# Patient Record
Sex: Male | Born: 1970
Health system: Southern US, Community
[De-identification: ages and names within clinical notes are randomized; demographics above are authoritative.]

## PROBLEM LIST (undated history)

## (undated) DIAGNOSIS — I10 Essential (primary) hypertension: Secondary | ICD-10-CM

## (undated) DIAGNOSIS — M1990 Unspecified osteoarthritis, unspecified site: Secondary | ICD-10-CM

## (undated) HISTORY — PX: OTHER SURGICAL HISTORY: SHX169

## (undated) HISTORY — PX: LAPAROSCOPIC GASTRIC BANDING: SHX1100

## (undated) HISTORY — PX: BACK SURGERY: SHX140

---

## 2002-03-18 ENCOUNTER — Emergency Department (HOSPITAL_COMMUNITY): Admission: EM | Admit: 2002-03-18 | Discharge: 2002-03-18 | Payer: Self-pay | Admitting: Emergency Medicine

## 2002-12-09 ENCOUNTER — Other Ambulatory Visit: Admission: RE | Admit: 2002-12-09 | Discharge: 2002-12-09 | Payer: Self-pay | Admitting: Urology

## 2004-02-02 ENCOUNTER — Ambulatory Visit (HOSPITAL_COMMUNITY): Admission: RE | Admit: 2004-02-02 | Discharge: 2004-02-02 | Payer: Self-pay | Admitting: Neurosurgery

## 2007-11-04 ENCOUNTER — Encounter: Admission: RE | Admit: 2007-11-04 | Discharge: 2007-12-29 | Payer: Self-pay | Admitting: Surgery

## 2007-11-11 ENCOUNTER — Ambulatory Visit (HOSPITAL_COMMUNITY): Admission: RE | Admit: 2007-11-11 | Discharge: 2007-11-11 | Payer: Self-pay | Admitting: Surgery

## 2007-11-11 ENCOUNTER — Ambulatory Visit: Admission: RE | Admit: 2007-11-11 | Discharge: 2007-11-11 | Payer: Self-pay | Admitting: Surgery

## 2007-11-18 ENCOUNTER — Ambulatory Visit (HOSPITAL_COMMUNITY): Admission: RE | Admit: 2007-11-18 | Discharge: 2007-11-18 | Payer: Self-pay | Admitting: Surgery

## 2008-01-26 ENCOUNTER — Encounter: Admission: RE | Admit: 2008-01-26 | Discharge: 2008-02-16 | Payer: Self-pay | Admitting: Surgery

## 2008-02-09 ENCOUNTER — Ambulatory Visit (HOSPITAL_COMMUNITY): Admission: RE | Admit: 2008-02-09 | Discharge: 2008-02-10 | Payer: Self-pay | Admitting: Surgery

## 2008-02-23 ENCOUNTER — Encounter: Admission: RE | Admit: 2008-02-23 | Discharge: 2008-02-23 | Payer: Self-pay | Admitting: Surgery

## 2009-10-16 ENCOUNTER — Ambulatory Visit: Payer: Self-pay | Admitting: Orthopedic Surgery

## 2009-10-16 DIAGNOSIS — L02519 Cutaneous abscess of unspecified hand: Secondary | ICD-10-CM

## 2009-10-16 DIAGNOSIS — L03119 Cellulitis of unspecified part of limb: Secondary | ICD-10-CM

## 2009-10-19 ENCOUNTER — Ambulatory Visit (HOSPITAL_COMMUNITY): Admission: RE | Admit: 2009-10-19 | Discharge: 2009-10-19 | Payer: Self-pay | Admitting: Family Medicine

## 2009-10-19 ENCOUNTER — Inpatient Hospital Stay (HOSPITAL_COMMUNITY): Admission: EM | Admit: 2009-10-19 | Discharge: 2009-10-20 | Payer: Self-pay | Admitting: Emergency Medicine

## 2010-05-08 NOTE — Letter (Signed)
Summary: Hisotry form  Hisotry form   Imported By: Jacklynn Ganong 10/23/2009 07:58:11  _____________________________________________________________________  External Attachment:    Type:   Image     Comment:   External Document

## 2010-05-08 NOTE — Assessment & Plan Note (Signed)
Summary: ?INFECTION HAND/LACERATION INJURY/REF S.BRUCE,PHONE/CAF   Visit Type:  Initial   Referring Provider:  Earma Reading Primary Provider:  [facility] Lis [facility]  CC:  pain swelling LEFT hand.  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  He said it healed up nicely and he did well until yesterday when he started having pain swelling increased redness with throbbing.  He reports 8/10 pain  He was seen by his primary care PA today who gave him a dose of Rocephin 1 g IV in asked that he be seen today so he is seen as an urgent referral  He reports negative review of systems for constitutional, HEENT, cardiovascular, respiratory, GI, GU, musculoskeletal, skin, neurologic, psychiatric, hematologic endocrine and ALLERGIC but he obviously has redness around the area previous laceration noted on the index finger and redness and swelling in his hand  He reports no ALLERGIES  Reports no medical problems  Report surgical treatment of his back and he also had a laparoscopic band  He takes lisinopril and hydrochlorothiazide 20 mg and 12.5 mg respectively  Family history was reported as negative  He is 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  Physical Exam  Additional Exam:  weight 212 pounds pulse 82 respiratory rate 18  He was well-developed and nourished woman hygiene were normal  Abnormal perfusion to his LEFT upper extremity with a strong radial pulse  He had no lymphangitis or lymphadenopathy  Data laceration on the radial border of his index finger proximally near the proximal phalanx the laceration appeared to have healed  Abnormal sensation in the finger  He was awake he was alert he was oriented x3  His gait pattern  was normal  Inspection revealed that his hand and index finger were swollen and red and tender to touch there is no joint involvement although he had decreased range of motion of the index finger.  Muscle tone was normal.  Stability of the digit was normal.   Impression & Recommendations:  Problem # 1:  CELLULITIS, LEFT HAND (ICD-682.4) Assessment New  His Ativan dosage of Rocephin this may do the trick but probably not because he seems to have a cellulitis now.  He did say that he had a pustule come up he popped it and this is probably the source of staph infection so he will be treated with Bactrim.  Right now he does not need any surgical treatment just to be followed on p.o. antibiotics.  If he does not improve then I would recommend outpatient IV therapy with vancomycin which can be followed by his primary care physicians.  If this fails to give him any better then of course we would see him again in consultation or as referral.  Orders: New Patient Level III ([address])  Patient Instructions: 1)  Soak the entire hand in warm water and epsom salts three times a day for 20 min  2)  take the oral pills two times a day  3)  f/u with Earma Reading Thursday  4)  if not better refer back to [place]  5)  OOW this week

## 2010-05-08 NOTE — Letter (Signed)
Summary: Out of Work  [facility]  [address]  [place], [place] [address]   Phone: [phone]  Fax: [phone]    October 16, 2009   Employee:  SAMAD THON    To Whom It May Concern:   For Medical reasons, please excuse the above named employee from work for the following dates:  Start:   10/16/2009  End:   10-20-2009   May return 10-23-2009  If you need additional information, please feel free to contact our office.         Sincerely,    Dr. Lorenz Coaster.

## 2010-06-24 LAB — BASIC METABOLIC PANEL
BUN: 9 mg/dL (ref 6–23)
CO2: 22 mEq/L (ref 19–32)
Calcium: 9.4 mg/dL (ref 8.4–10.5)
Chloride: 101 mEq/L (ref 96–112)
Creatinine, Ser: 0.75 mg/dL (ref 0.4–1.5)
GFR calc Af Amer: >60 mL/min (ref >60)
GFR calc non Af Amer: >60 mL/min (ref >60)
Glucose, Bld: 91 mg/dL (ref 70–99)
Potassium: 3.8 mEq/L (ref 3.5–5.1)
Sodium: 137 mEq/L (ref 135–145)

## 2010-06-24 LAB — ANAEROBIC CULTURE

## 2010-06-24 LAB — CULTURE, ROUTINE-ABSCESS

## 2010-06-24 LAB — CBC
HCT: 44.8 % (ref 39.0–52.0)
Hemoglobin: 15.6 g/dL (ref 13.0–17.0)
MCH: 33.9 pg (ref 26.0–34.0)
MCHC: 34.7 g/dL (ref 30.0–36.0)
MCV: 97.5 fL (ref 78.0–100.0)
Platelets: 287 10*3/uL (ref 150–400)
RBC: 4.6 MIL/uL (ref 4.22–5.81)
RDW: 12.8 % (ref 11.5–15.5)
WBC: 10.1 10*3/uL (ref 4.0–10.5)

## 2010-06-24 LAB — DIFFERENTIAL
Basophils Absolute: 0 10*3/uL (ref 0.0–0.1)
Basophils Relative: 0 % (ref 0–1)
Eosinophils Absolute: 0.1 10*3/uL (ref 0.0–0.7)
Eosinophils Relative: 1 % (ref 0–5)
Lymphocytes Relative: 17 % (ref 12–46)
Lymphs Abs: 1.7 10*3/uL (ref 0.7–4.0)
Monocytes Absolute: 1.1 10*3/uL — ABNORMAL HIGH (ref 0.1–1.0)
Monocytes Relative: 11 % (ref 3–12)
Neutro Abs: 7.1 10*3/uL (ref 1.7–7.7)
Neutrophils Relative %: 70 % (ref 43–77)

## 2010-08-21 NOTE — Op Note (Signed)
Riley Larson, Riley Larson                ACCOUNT NO.:  [id number]   MEDICAL RECORD NO.:  [id number]          PATIENT TYPE:  OIB   LOCATION:  1537                         FACILITY:  [facility]   PHYSICIAN:  Thornton Park. Daphine Deutscher, MD  DATE OF BIRTH:  [birth year]   DATE OF PROCEDURE:  02/09/2008  DATE OF DISCHARGE:                               OPERATIVE REPORT   CHIEF COMPLAINT:  Morbid obesity, Body Mass Index of 41.   PROCEDURE:  Lap-Band laparoscopic adjustable gastric banding (Allergan  APL).   SURGEON:  Daphine Deutscher   ASSISTANT:  Earle   ANESTHESIA:  General endotracheal.   DESCRIPTION OF PROCEDURE:  Riley Larson is 40 year old white male who  was brought to [facility] on Tuesday, February 09, 2008, and given general  anesthesia.  The abdomen was prepped with chlorhexidine and alcohol prep  (ChloraPrep), and draped sterilely.  Access to the abdomen was achieved  through the left upper quadrant using a 0-degree Optiview without  difficulty insufflating the abdomen.  The transverse colon was actually  kind of stuck up to the falciform ligament and I ended up placing a  camera viewport to the left of the umbilicus, and then using the left  upper quadrant port and sharp dissection to take these adhesions down.  Once I was down, standard ports were placed on the right side including  the 15 in the right upper position and 11 below.  A 5 was placed in the  upper midline for the [facility] retractor.  He had a large left lateral  segment and this was retracted.  Dr. Colin Benton and inserted another 5  laterally on the left.   We dissected a spot to the left of the crus and dissected that with a  finger for the band passer to come through.  I then with the pars  flaccida technique opened the gastrohepatic window and found the fat  stripe down at the base of the right crus and passed a band passer along  that, and then I introduced an APL band, brought it around, and snapped  it in place, although this one tended to  snap with a little more  difficulty.  Prior to that, I had passed the balloon-tip catheter and  had calibrated, and he had no evidence of a hiatal hernia.  This was  snapped down in place over the tubing and there seemed to be plenty of  room.   I then plicated the anterior stomach over up to the pouch with 4 sutures  of Surgidac, securing them with tie knots, and then an anti-slip stitch  was placed on the medial border.  The tubing was brought out through the  right lower port and then a location was created on the fascia there  where once connected to the port, it was secured with 4  sutures of 2-0 Prolene to the fascia.  Wounds were all irrigated with  saline.  They were closed with 4-0 Vicryl, Benzoin, and Steri-Strips.  The patient seemed to tolerate the procedure well and was taken to the  recovery room in satisfactory addition.  Port  sites were injected with  0.5% Marcaine plain.      Thornton Park Daphine Deutscher, MD  Electronically Signed     MBM/MEDQ  D:  02/09/2008  T:  02/09/2008  Job:  [id number]

## 2010-08-21 NOTE — Procedures (Signed)
NAME:  Riley Larson, Riley Larson                ACCOUNT NO.:  [id number]   MEDICAL RECORD NO.:  [id number]          PATIENT TYPE:  OUT   LOCATION:  SLEE                          FACILITY:  [facility]   PHYSICIAN:  Kofi A. Gerilyn Pilgrim, M.D. DATE OF BIRTH:  16-Aug-1970   DATE OF PROCEDURE:  DATE OF DISCHARGE:  11/11/2007                             SLEEP DISORDER REPORT   INDICATIONS:  A 40 year old man who presents with snoring, witnessed  apneas.  He is being evaluated for possible obstructive sleep apnea  syndrome.   MEDICATIONS:  Lisinopril, hydrochlorothiazide, diltiazem.   Epworth sleepiness scale 8.   BMI 50.   SLEEP STAGE SUMMARY:  The total recording time is 357 minutes.  Sleep  efficiency 90%.  Sleep latency 8 minutes.  REM latency 104 minutes.  Stage N1 9%, N2 65%, N3 11.5%.  REM sleep 14%.   RESPIRATORY SUMMARY:  Baseline oxygen saturation 95%.  Lowest saturation  83%.  AHI 7.   LIMB MOVEMENT SUMMARY:  PLM index 0.   ELECTROCARDIOGRAM SUMMARY:  Average heart rate 72 with no arrhythmias  observed.   IMPRESSION:  Mild obstructive sleep apnea syndrome not requiring  positive pressure.      Kofi A. Gerilyn Pilgrim, M.D.  Electronically Signed     KAD/MEDQ  D:  11/16/2007  T:  11/17/2007  Job:  [id number]

## 2011-01-08 LAB — BASIC METABOLIC PANEL
BUN: 10
CO2: 29
Calcium: 9.4
Chloride: 106
Creatinine, Ser: 0.93
GFR calc Af Amer: >60
GFR calc non Af Amer: >60
Glucose, Bld: 109 — ABNORMAL HIGH
Potassium: 4.7
Sodium: 141

## 2011-01-08 LAB — DIFFERENTIAL
Basophils Absolute: 0.1
Basophils Relative: 1
Eosinophils Absolute: 0
Eosinophils Relative: 0
Lymphocytes Relative: 8 — ABNORMAL LOW
Lymphs Abs: 0.9
Monocytes Absolute: 0.6
Monocytes Relative: 6
Neutro Abs: 8.9 — ABNORMAL HIGH
Neutrophils Relative %: 85 — ABNORMAL HIGH

## 2011-01-08 LAB — HEMOGLOBIN AND HEMATOCRIT, BLOOD
HCT: 45.2
HCT: 43.6
Hemoglobin: 15.7
Hemoglobin: 14.8

## 2011-01-08 LAB — CBC
HCT: 40.6
Hemoglobin: 13.9
MCHC: 34.3
MCV: 94.7
Platelets: 278
RBC: 4.29
RDW: 12.4
WBC: 10.5

## 2011-03-13 ENCOUNTER — Other Ambulatory Visit (HOSPITAL_COMMUNITY): Payer: Self-pay | Admitting: Physician Assistant

## 2011-03-13 DIAGNOSIS — M519 Unspecified thoracic, thoracolumbar and lumbosacral intervertebral disc disorder: Secondary | ICD-10-CM

## 2011-03-15 ENCOUNTER — Ambulatory Visit (HOSPITAL_COMMUNITY)
Admission: RE | Admit: 2011-03-15 | Discharge: 2011-03-15 | Disposition: A | Discharge status: Home / Self Care | Payer: BC Managed Care – PPO | Source: Ambulatory Visit | Attending: Physician Assistant | Admitting: Physician Assistant

## 2011-03-15 DIAGNOSIS — M5126 Other intervertebral disc displacement, lumbar region: Secondary | ICD-10-CM | POA: Insufficient documentation

## 2011-03-15 DIAGNOSIS — M545 Low back pain, unspecified: Secondary | ICD-10-CM | POA: Insufficient documentation

## 2011-03-15 DIAGNOSIS — M519 Unspecified thoracic, thoracolumbar and lumbosacral intervertebral disc disorder: Secondary | ICD-10-CM

## 2011-03-15 DIAGNOSIS — M25559 Pain in unspecified hip: Secondary | ICD-10-CM | POA: Insufficient documentation

## 2011-08-01 ENCOUNTER — Telehealth (INDEPENDENT_AMBULATORY_CARE_PROVIDER_SITE_OTHER): Payer: Self-pay | Admitting: Surgery

## 2011-08-01 NOTE — Telephone Encounter (Signed)
08/01/11 mailed recall letter to patient for bariatric surgery follow-up. Advised patient to call [facility] @ [phone] to schedule an appointment. cef 

## 2013-07-22 ENCOUNTER — Telehealth (HOSPITAL_COMMUNITY): Payer: Self-pay

## 2013-07-22 NOTE — Telephone Encounter (Signed)
This patient is overdue for recommended follow-up with a bariatric surgeon at [facility]. Call attempted today to reestablish post-op care with [facility], but unable to reach patient by phone.  A letter will be mailed to the patient today to the address on file from [facility] advising the patient on the benefits of follow-up care and directing them to call [facility] at [phone] to schedule an appointment at their earliest convenience.  ° °Amanda T. Fleming °Bariatric Office Coordinator °[phone] ° °

## 2015-01-24 ENCOUNTER — Telehealth (HOSPITAL_COMMUNITY): Payer: Self-pay

## 2015-01-24 NOTE — Telephone Encounter (Signed)
This patient is overdue for recommended follow-up with a bariatric surgeon at [facility]. A letter was mailed to the patient 01-12-15 to the address on file from both [place] advising the patient on the benefits of follow-up care and directing them to call [facility] at [phone] to schedule an appointment at their earliest convenience. Letter was returned to [facility] today marked undeliverable, unable to forward. Call attempted today to reestablish post-op care with [facility] & update mailing address, but unable to reach patient by phone.  Both home & Mobile # are invalid & unsure if the work # is associated to this patient. No e-mail address on file.    Alphonsa Overall. [facility] Bariatric Office Coordinator [phone]

## 2016-05-08 DIAGNOSIS — M1000 Idiopathic gout, unspecified site: Secondary | ICD-10-CM | POA: Diagnosis not present

## 2016-05-08 DIAGNOSIS — M25561 Pain in right knee: Secondary | ICD-10-CM | POA: Diagnosis not present

## 2016-05-08 DIAGNOSIS — Z1389 Encounter for screening for other disorder: Secondary | ICD-10-CM | POA: Diagnosis not present

## 2016-05-08 DIAGNOSIS — Z6833 Body mass index (BMI) 33.0-33.9, adult: Secondary | ICD-10-CM | POA: Diagnosis not present

## 2016-05-08 DIAGNOSIS — E6609 Other obesity due to excess calories: Secondary | ICD-10-CM | POA: Diagnosis not present

## 2016-11-14 DIAGNOSIS — F419 Anxiety disorder, unspecified: Secondary | ICD-10-CM | POA: Diagnosis not present

## 2016-11-14 DIAGNOSIS — Z79899 Other long term (current) drug therapy: Secondary | ICD-10-CM | POA: Diagnosis not present

## 2016-11-14 DIAGNOSIS — Z6832 Body mass index (BMI) 32.0-32.9, adult: Secondary | ICD-10-CM | POA: Diagnosis not present

## 2016-11-14 DIAGNOSIS — E6609 Other obesity due to excess calories: Secondary | ICD-10-CM | POA: Diagnosis not present

## 2016-11-14 DIAGNOSIS — Z1389 Encounter for screening for other disorder: Secondary | ICD-10-CM | POA: Diagnosis not present

## 2016-11-14 DIAGNOSIS — M1000 Idiopathic gout, unspecified site: Secondary | ICD-10-CM | POA: Diagnosis not present

## 2016-11-29 DIAGNOSIS — E6609 Other obesity due to excess calories: Secondary | ICD-10-CM | POA: Diagnosis not present

## 2016-11-29 DIAGNOSIS — Z6832 Body mass index (BMI) 32.0-32.9, adult: Secondary | ICD-10-CM | POA: Diagnosis not present

## 2016-11-29 DIAGNOSIS — M1000 Idiopathic gout, unspecified site: Secondary | ICD-10-CM | POA: Diagnosis not present

## 2016-12-24 DIAGNOSIS — B078 Other viral warts: Secondary | ICD-10-CM | POA: Diagnosis not present

## 2016-12-24 DIAGNOSIS — L570 Actinic keratosis: Secondary | ICD-10-CM | POA: Diagnosis not present

## 2016-12-24 DIAGNOSIS — X32XXXA Exposure to sunlight, initial encounter: Secondary | ICD-10-CM | POA: Diagnosis not present

## 2016-12-24 DIAGNOSIS — D225 Melanocytic nevi of trunk: Secondary | ICD-10-CM | POA: Diagnosis not present

## 2016-12-24 DIAGNOSIS — A630 Anogenital (venereal) warts: Secondary | ICD-10-CM | POA: Diagnosis not present

## 2017-01-21 DIAGNOSIS — M25561 Pain in right knee: Secondary | ICD-10-CM | POA: Diagnosis not present

## 2017-01-21 DIAGNOSIS — Z6832 Body mass index (BMI) 32.0-32.9, adult: Secondary | ICD-10-CM | POA: Diagnosis not present

## 2017-01-21 DIAGNOSIS — Z1389 Encounter for screening for other disorder: Secondary | ICD-10-CM | POA: Diagnosis not present

## 2017-01-21 DIAGNOSIS — M1000 Idiopathic gout, unspecified site: Secondary | ICD-10-CM | POA: Diagnosis not present

## 2017-01-21 DIAGNOSIS — E6609 Other obesity due to excess calories: Secondary | ICD-10-CM | POA: Diagnosis not present

## 2017-02-06 DIAGNOSIS — M25469 Effusion, unspecified knee: Secondary | ICD-10-CM | POA: Diagnosis not present

## 2017-02-06 DIAGNOSIS — Z6832 Body mass index (BMI) 32.0-32.9, adult: Secondary | ICD-10-CM | POA: Diagnosis not present

## 2017-02-06 DIAGNOSIS — Z1389 Encounter for screening for other disorder: Secondary | ICD-10-CM | POA: Diagnosis not present

## 2017-02-06 DIAGNOSIS — M1009 Idiopathic gout, multiple sites: Secondary | ICD-10-CM | POA: Diagnosis not present

## 2017-02-06 DIAGNOSIS — E6609 Other obesity due to excess calories: Secondary | ICD-10-CM | POA: Diagnosis not present

## 2017-09-08 ENCOUNTER — Other Ambulatory Visit (HOSPITAL_COMMUNITY): Payer: Self-pay | Admitting: Family Medicine

## 2017-09-08 ENCOUNTER — Ambulatory Visit (HOSPITAL_COMMUNITY)
Admission: RE | Admit: 2017-09-08 | Discharge: 2017-09-08 | Disposition: A | Discharge status: Home / Self Care | Payer: BLUE CROSS/BLUE SHIELD | Source: Ambulatory Visit | Attending: Family Medicine | Admitting: Family Medicine

## 2017-09-08 DIAGNOSIS — R079 Chest pain, unspecified: Secondary | ICD-10-CM

## 2017-09-08 DIAGNOSIS — I2510 Atherosclerotic heart disease of native coronary artery without angina pectoris: Secondary | ICD-10-CM | POA: Diagnosis not present

## 2017-09-08 DIAGNOSIS — Z9884 Bariatric surgery status: Secondary | ICD-10-CM | POA: Insufficient documentation

## 2017-09-08 DIAGNOSIS — R0602 Shortness of breath: Secondary | ICD-10-CM | POA: Diagnosis not present

## 2017-09-08 DIAGNOSIS — E6609 Other obesity due to excess calories: Secondary | ICD-10-CM | POA: Diagnosis not present

## 2017-09-08 DIAGNOSIS — K760 Fatty (change of) liver, not elsewhere classified: Secondary | ICD-10-CM | POA: Insufficient documentation

## 2017-09-08 DIAGNOSIS — I700 Atherosclerosis of aorta: Secondary | ICD-10-CM | POA: Insufficient documentation

## 2017-09-08 DIAGNOSIS — Z1389 Encounter for screening for other disorder: Secondary | ICD-10-CM | POA: Diagnosis not present

## 2017-09-08 DIAGNOSIS — R0789 Other chest pain: Secondary | ICD-10-CM | POA: Diagnosis not present

## 2017-09-08 DIAGNOSIS — Z6833 Body mass index (BMI) 33.0-33.9, adult: Secondary | ICD-10-CM | POA: Diagnosis not present

## 2017-09-08 LAB — POCT I-STAT CREATININE: Creatinine, Ser: 0.7 mg/dL (ref 0.61–1.24)

## 2017-09-08 MED ORDER — IOPAMIDOL (ISOVUE-370) INJECTION 76%
100.0000 mL | Freq: Once | INTRAVENOUS | Status: AC | PRN
  Administered 2017-09-08: 100 mL via INTRAVENOUS

## 2018-09-28 DIAGNOSIS — Z9884 Bariatric surgery status: Secondary | ICD-10-CM | POA: Diagnosis not present

## 2018-09-28 DIAGNOSIS — Z6832 Body mass index (BMI) 32.0-32.9, adult: Secondary | ICD-10-CM | POA: Diagnosis not present

## 2018-09-28 DIAGNOSIS — Z1389 Encounter for screening for other disorder: Secondary | ICD-10-CM | POA: Diagnosis not present

## 2018-09-28 DIAGNOSIS — I10 Essential (primary) hypertension: Secondary | ICD-10-CM | POA: Diagnosis not present

## 2018-09-28 DIAGNOSIS — E7849 Other hyperlipidemia: Secondary | ICD-10-CM | POA: Diagnosis not present

## 2018-09-28 DIAGNOSIS — M109 Gout, unspecified: Secondary | ICD-10-CM | POA: Diagnosis not present

## 2018-12-22 ENCOUNTER — Other Ambulatory Visit: Payer: Self-pay | Admitting: Neurological Surgery

## 2018-12-22 ENCOUNTER — Other Ambulatory Visit (HOSPITAL_COMMUNITY): Payer: Self-pay | Admitting: Neurological Surgery

## 2018-12-22 DIAGNOSIS — M5416 Radiculopathy, lumbar region: Secondary | ICD-10-CM

## 2018-12-30 ENCOUNTER — Encounter (HOSPITAL_COMMUNITY): Payer: Self-pay

## 2018-12-30 ENCOUNTER — Other Ambulatory Visit: Payer: Self-pay

## 2018-12-30 ENCOUNTER — Ambulatory Visit (HOSPITAL_COMMUNITY)
Admission: RE | Admit: 2018-12-30 | Discharge: 2018-12-30 | Disposition: A | Discharge status: Home / Self Care | Payer: BLUE CROSS/BLUE SHIELD | Source: Ambulatory Visit | Attending: Neurological Surgery | Admitting: Neurological Surgery

## 2018-12-30 ENCOUNTER — Other Ambulatory Visit (HOSPITAL_COMMUNITY): Payer: Self-pay | Admitting: Neurological Surgery

## 2018-12-30 ENCOUNTER — Other Ambulatory Visit: Payer: Self-pay | Admitting: Neurological Surgery

## 2018-12-30 DIAGNOSIS — M5416 Radiculopathy, lumbar region: Secondary | ICD-10-CM | POA: Diagnosis not present

## 2018-12-30 LAB — POCT I-STAT CREATININE: Creatinine, Ser: 0.7 mg/dL (ref 0.61–1.24)

## 2019-01-11 ENCOUNTER — Ambulatory Visit
Admission: RE | Admit: 2019-01-11 | Discharge: 2019-01-11 | Disposition: A | Discharge status: Home / Self Care | Payer: BC Managed Care – PPO | Source: Ambulatory Visit | Attending: Neurological Surgery | Admitting: Neurological Surgery

## 2019-01-11 ENCOUNTER — Other Ambulatory Visit: Payer: Self-pay

## 2019-01-11 DIAGNOSIS — M5416 Radiculopathy, lumbar region: Secondary | ICD-10-CM | POA: Diagnosis present

## 2019-01-11 MED ORDER — GADOBUTROL 1 MMOL/ML IV SOLN
10.0000 mL | Freq: Once | INTRAVENOUS | Status: AC | PRN
  Administered 2019-01-11: 10 mL via INTRAVENOUS

## 2019-06-17 ENCOUNTER — Other Ambulatory Visit: Payer: Self-pay

## 2019-06-17 ENCOUNTER — Encounter (HOSPITAL_COMMUNITY): Payer: Self-pay | Admitting: Physical Therapy

## 2019-06-17 ENCOUNTER — Ambulatory Visit (HOSPITAL_COMMUNITY): Payer: BC Managed Care – PPO | Attending: Neurological Surgery | Admitting: Physical Therapy

## 2019-06-17 DIAGNOSIS — G8929 Other chronic pain: Secondary | ICD-10-CM | POA: Insufficient documentation

## 2019-06-17 DIAGNOSIS — M6281 Muscle weakness (generalized): Secondary | ICD-10-CM | POA: Insufficient documentation

## 2019-06-17 DIAGNOSIS — M5441 Lumbago with sciatica, right side: Secondary | ICD-10-CM | POA: Insufficient documentation

## 2019-06-17 DIAGNOSIS — R29898 Other symptoms and signs involving the musculoskeletal system: Secondary | ICD-10-CM | POA: Insufficient documentation

## 2019-06-17 NOTE — Therapy (Signed)
[place], [place], [address] Phone: [phone]   Fax:  [phone]  Physical Therapy Evaluation  Patient Details  Name: Riley Larson MRN: [id number] Date of Birth: June 30, 1970 Referring Provider (PT): Emelda Brothers, MD   Encounter Date: 06/17/2019  PT End of Session - 06/17/19 1758    Visit Number  1    Number of Visits  12    Date for PT Re-Evaluation  07/29/19    Authorization Type  Primary: BCBS; Secondary: [employer]    Authorization Time Period  06/17/19 - 07/29/19    Progress Note Due on Visit  10    PT Start Time  [id number]    PT Stop Time  1345    PT Time Calculation (min)  40 min    Activity Tolerance  Patient tolerated treatment well    Behavior During Therapy  [facility] for tasks assessed/performed       History reviewed. No pertinent past medical history.  History reviewed. No pertinent surgical history.  There were no vitals filed for this visit.   Subjective Assessment - 06/17/19 1313    Subjective  Patient reported that he has had back pain which has fluctuated in intensity for many years. The patient reported that some days he feels 100%, but other days he has a really hard time due to pain. Patient reported that he has had back pain since about August of 2020. He stated that he had back surgery on  May 03, 2019 and that this helped decrease his pain some. He reported that he also had a back surgery in 2005-2006. He stated that this original surgery was to fix L5-S1 which had ruptured. He reported that the 2nd surgery was to repair between L5-S1. Patient reported that he has pain that goes laterally down his right leg to his foot. He reported that at times the pain can be sharper, but can also be dull. Patient reported that midday is when the pain increases the most. He reported that the numbness in his leg has stopped following the surgery, but that it is pain that he continues to feel. He  denied any changes in his bowel or bladder function. Patient reported that at times his pain gets up to a 9 out of 10.    Limitations  Sitting;Standing;Walking;House hold activities    Diagnostic tests  MRI    Patient Stated Goals  To have less pain    Currently in Pain?  No/denies         [facility] PT Assessment - 06/17/19 0001      Assessment   Medical Diagnosis  Lumbar radiculopathy    Referring Provider (PT)  Emelda Brothers, MD    Onset Date/Surgical Date  05/03/19    Next MD Visit  --   In a few weeks   Prior Therapy  None      Precautions   Precautions  None      Restrictions   Weight Bearing Restrictions  No      Balance Screen   Has the patient fallen in the past 6 months  No    Has the patient had a decrease in activity level because of a fear of falling?   Yes    Is the patient reluctant to leave their home because of a fear of falling?   No      Home Social worker  Private residence    Living  Arrangements  Spouse/significant other    Type of Home  House      Prior Function   Level of Independence  Independent;Independent with basic ADLs    Vocation  --   Out of work currently   Leisure  riding motorcycles      Cognition   Overall Cognitive Status  Within Functional Limits for tasks assessed      Observation/Other Assessments   Focus on Therapeutic Outcomes (FOTO)   44%      Sensation   Light Touch  Appears Intact      ROM / Strength   AROM / PROM / Strength  AROM;Strength      AROM   AROM Assessment Site  Lumbar    Lumbar Flexion  50% limited painful on the right side     Lumbar Extension  75% limited painful on the right side    Lumbar - Right Side Bend  WFL     Lumbar - Left Side Bend  WFL    Lumbar - Right Rotation  WFL    Lumbar - Left Rotation  [facility]      Strength   Strength Assessment Site  Hip;Knee;Ankle    Right/Left Hip  Right;Left    Right Hip Flexion  4+/5    Right Hip Extension  4+/5   painful   Right Hip  ABduction  4+/5    Left Hip Flexion  5/5    Left Hip Extension  4+/5   Painful   Left Hip ABduction  4+/5    Right/Left Knee  Right;Left    Right Knee Flexion  5/5    Right Knee Extension  5/5    Left Knee Flexion  5/5    Left Knee Extension  5/5    Right/Left Ankle  Right;Left    Right Ankle Dorsiflexion  5/5    Left Ankle Dorsiflexion  5/5      Palpation   Spinal mobility  Hypomobility throughout thoracic and lumbar spine with CPAs    Palpation comment  Tenderness to palpation around incision site      Special Tests    Special Tests  Lumbar    Lumbar Tests  Slump Test      Slump test   Findings  Negative    Side  --   Bilateral     Ambulation/Gait   Ambulation/Gait  Yes    Ambulation Distance (Feet)  452 Feet   2MWT   Assistive device  None    Ambulation Surface  Level;Indoor    Gait Comments  Increased valgus bilaterally, increased ER bilaterally                Objective measurements completed on examination: See above findings.              PT Education - 06/17/19 1758    Education Details  Discussed examination findings, POC, and initial HEP.    Person(s) Educated  Patient    Methods  Explanation;Handout    Comprehension  Verbalized understanding       PT Short Term Goals - 06/17/19 1812      PT SHORT TERM GOAL #1   Title  Patient will report understanding and regular compliance with HEP to improve mobility and decrease pain.    Time  3    Period  Weeks    Status  New    Target Date  07/08/19      PT SHORT TERM GOAL #2  Title  Patient will report that his pain level has not exceeded a 7/10 over the course of a 1 week period indicating improved tolerance to daily activities.    Time  3    Period  Weeks    Status  New    Target Date  07/08/19        PT Long Term Goals - 06/17/19 1814      PT LONG TERM GOAL #1   Title  Patient will report an improvement of at least 50% in overall symptoms  to improve QOL.    Time  6     Period  Weeks    Status  New    Target Date  07/29/19      PT LONG TERM GOAL #2   Title  Patient will report that his RT leg pain has not extended past his knee for at least a week indicating improved centralization of symptoms.    Time  6    Period  Weeks    Status  New    Target Date  07/29/19      PT LONG TERM GOAL #3   Title  Patient will report that his pain level has not exceeded a 3/10 over the course of a 1 week period indicating improved tolerance to daily activities.    Time  6    Period  Weeks    Status  New    Target Date  07/29/19             Plan - 06/17/19 1829    Clinical Impression Statement  Patient is a 49 year old male who presents to outpatient physical therapy with primary complaint of low back pain which has been ongoing for several months in addition, he had a back surgery on 05/03/19. He reported that he has not been able to work due to current limitations from his back. Upon examination, noted decreased lumbar AROM, some decreased strength, and pain with some functional movement. Palpation showed hypomobility through the spine as well as some tenderness around the incision. Patient also demonstrated some gait deviations with ambulation. Patient would benefit from skilled physical therapy to address the abovementioned deficits and help patient return to his PLOF.    Personal Factors and Comorbidities  Time since onset of injury/illness/exacerbation;Comorbidity 1    Comorbidities  HTN    Examination-Activity Limitations  Stand;Lift;Locomotion Level;Bend;Reach Overhead;Squat;Stairs    Examination-Participation Restrictions  Yard Work;Meal Prep;Cleaning;Community Activity    Stability/Clinical Decision Making  Stable/Uncomplicated    Clinical Decision Making  Low    Rehab Potential  Good    PT Frequency  2x / week    PT Duration  6 weeks    PT Treatment/Interventions  ADLs/Self Care Home Management;Aquatic Therapy;Cryotherapy;Electrical Stimulation;Moist  Heat;Traction;DME Instruction;Gait training;Stair training;Functional mobility training;Therapeutic activities;Therapeutic exercise;Balance training;Neuromuscular re-education;Patient/family education;Orthotic Fit/Training;Manual techniques;Passive range of motion;Scar mobilization;Dry needling;Energy conservation;Taping    PT Next Visit Plan  Review eval goals and HEP, perform gentle joint mobilizations spine for pain relief, ROM    PT Home Exercise Plan  06/17/19: Ab set with legs at 90/90 20x 5'' holds    Consulted and Agree with Plan of Care  Patient       Patient will benefit from skilled therapeutic intervention in order to improve the following deficits and impairments:  Abnormal gait, Increased fascial restricitons, Pain, Improper body mechanics, Decreased mobility, Postural dysfunction, Decreased activity tolerance, Decreased endurance, Decreased range of motion, Decreased strength, Hypomobility  Visit Diagnosis: Chronic bilateral low  back pain with right-sided sciatica  Muscle weakness (generalized)  Other symptoms and signs involving the musculoskeletal system     Problem List Patient Active Problem List   Diagnosis Date Noted  . CELLULITIS, LEFT HAND 10/16/2009   [name] PT, DPT 6:33 PM, 06/17/19 [place], [place], [address] Phone: [phone]   Fax:  [phone]  Name: Riley Larson MRN: [id number] Date of Birth: 1970/07/27

## 2019-06-24 ENCOUNTER — Ambulatory Visit (HOSPITAL_COMMUNITY): Payer: BC Managed Care – PPO | Admitting: Physical Therapy

## 2019-06-24 ENCOUNTER — Encounter (HOSPITAL_COMMUNITY): Payer: Self-pay | Admitting: Physical Therapy

## 2019-06-24 ENCOUNTER — Other Ambulatory Visit: Payer: Self-pay

## 2019-06-24 DIAGNOSIS — G8929 Other chronic pain: Secondary | ICD-10-CM

## 2019-06-24 DIAGNOSIS — M5441 Lumbago with sciatica, right side: Secondary | ICD-10-CM

## 2019-06-24 DIAGNOSIS — M6281 Muscle weakness (generalized): Secondary | ICD-10-CM

## 2019-06-24 DIAGNOSIS — R29898 Other symptoms and signs involving the musculoskeletal system: Secondary | ICD-10-CM

## 2019-06-24 NOTE — Therapy (Signed)
[place], [place], [address] Phone: [phone]   Fax:  [phone]  Physical Therapy Treatment  Patient Details  Name: Riley Larson MRN: [id number] Date of Birth: 10-Oct-1970 Referring Provider (PT): Emelda Brothers, MD   Encounter Date: 06/24/2019  PT End of Session - 06/24/19 1326    Visit Number  2    Number of Visits  12    Date for PT Re-Evaluation  07/29/19    Authorization Type  Primary: BCBS; Secondary: [employer]    Authorization Time Period  06/17/19 - 07/29/19    Progress Note Due on Visit  10    PT Start Time  [id number]    PT Stop Time  1355    PT Time Calculation (min)  38 min    Activity Tolerance  Patient tolerated treatment well    Behavior During Therapy  [facility] for tasks assessed/performed       History reviewed. No pertinent past medical history.  History reviewed. No pertinent surgical history.  There were no vitals filed for this visit.  Subjective Assessment - 06/24/19 1320    Subjective  Patient reported that he feels like his back has been improving and reports 2/10 pain.    Limitations  Sitting;Standing;Walking;House hold activities    Diagnostic tests  MRI    Patient Stated Goals  To have less pain    Currently in Pain?  Yes    Pain Score  2     Pain Location  Back    Pain Orientation  Lower    Pain Descriptors / Indicators  Aching                       [facility] Adult PT Treatment/Exercise - 06/24/19 0001      Exercises   Exercises  Lumbar      Lumbar Exercises: Stretches   Passive Hamstring Stretch  Right;Left;3 reps;30 seconds    Passive Hamstring Stretch Limitations  with strap      Lumbar Exercises: Supine   Ab Set  20 reps    AB Set Limitations  3-5 second holds, ball under knees      Lumbar Exercises: Sidelying   Clam  Right;Left;15 reps    Clam Limitations  with RTB       Manual Therapy   Manual Therapy  Soft tissue mobilization;Joint  mobilization    Manual therapy comments  All manual completed separately from other skilled interventions    Joint Mobilization  Grade II-III CPAs to T6-L2 for pain relief and mobility    Soft tissue mobilization  IASTM with red weighted ball to lumbar paraspinals for pain relief and for relaxation             PT Education - 06/24/19 1429    Education Details  Discussed purpose and technique of interventions throughout session as well as educated on updated HEP.    Person(s) Educated  Patient    Methods  Explanation    Comprehension  Verbalized understanding       PT Short Term Goals - 06/24/19 1321      PT SHORT TERM GOAL #1   Title  Patient will report understanding and regular compliance with HEP to improve mobility and decrease pain.    Time  3    Period  Weeks    Status  On-going    Target Date  07/08/19      PT SHORT  TERM GOAL #2   Title  Patient will report that his pain level has not exceeded a 7/10 over the course of a 1 week period indicating improved tolerance to daily activities.    Time  3    Period  Weeks    Status  On-going    Target Date  07/08/19        PT Long Term Goals - 06/24/19 1321      PT LONG TERM GOAL #1   Title  Patient will report an improvement of at least 50% in overall symptoms  to improve QOL.    Time  6    Period  Weeks    Status  On-going      PT LONG TERM GOAL #2   Title  Patient will report that his RT leg pain has not extended past his knee for at least a week indicating improved centralization of symptoms.    Time  6    Period  Weeks    Status  On-going      PT LONG TERM GOAL #3   Title  Patient will report that his pain level has not exceeded a 3/10 over the course of a 1 week period indicating improved tolerance to daily activities.    Time  6    Period  Weeks    Status  On-going            Plan - 06/24/19 1513    Clinical Impression Statement  Began by reviewing the patient's evaluation and goals. Began with  exercises to improve mobility and strength of abdominals. This session added hamstring stretch to improve mobility and decrease pain. Patient had increased difficulty with performing the hamstring stretch on the right LE, but reported overall that the stretch was tolerable. Educated patient on how to perform this at home. Ended session with manual therapy for pain relief and improve mobility. Patient reported feeling good at the end of the session.    Personal Factors and Comorbidities  Time since onset of injury/illness/exacerbation;Comorbidity 1    Comorbidities  HTN    Examination-Activity Limitations  Stand;Lift;Locomotion Level;Bend;Reach Overhead;Squat;Stairs    Examination-Participation Restrictions  Yard Work;Meal Prep;Cleaning;Community Activity    Stability/Clinical Decision Making  Stable/Uncomplicated    Rehab Potential  Good    PT Frequency  2x / week    PT Duration  6 weeks    PT Treatment/Interventions  ADLs/Self Care Home Management;Aquatic Therapy;Cryotherapy;Electrical Stimulation;Moist Heat;Traction;DME Instruction;Gait training;Stair training;Functional mobility training;Therapeutic activities;Therapeutic exercise;Balance training;Neuromuscular re-education;Patient/family education;Orthotic Fit/Training;Manual techniques;Passive range of motion;Scar mobilization;Dry needling;Energy conservation;Taping    PT Next Visit Plan  Continue manual PRN. Continue abdominal strengthening and hip strengthening.    PT Home Exercise Plan  06/17/19: Ab set with legs at 90/90 20x 5'' holds; 06/24/19: HS stretch 3x20'' each LE    Consulted and Agree with Plan of Care  Patient       Patient will benefit from skilled therapeutic intervention in order to improve the following deficits and impairments:  Abnormal gait, Increased fascial restricitons, Pain, Improper body mechanics, Decreased mobility, Postural dysfunction, Decreased activity tolerance, Decreased endurance, Decreased range of motion,  Decreased strength, Hypomobility  Visit Diagnosis: Chronic bilateral low back pain with right-sided sciatica  Muscle weakness (generalized)  Other symptoms and signs involving the musculoskeletal system     Problem List Patient Active Problem List   Diagnosis Date Noted  . CELLULITIS, LEFT HAND 10/16/2009   [name] PT, DPT 3:16 PM, 06/24/19 [place]  [facility] [place], [place], [address] Phone: [phone]   Fax:  [phone]  Name: Riley Larson MRN: [id number] Date of Birth: 1970/06/12

## 2019-06-25 ENCOUNTER — Other Ambulatory Visit: Payer: Self-pay

## 2019-06-25 ENCOUNTER — Encounter (HOSPITAL_COMMUNITY): Payer: Self-pay | Admitting: Physical Therapy

## 2019-06-25 ENCOUNTER — Ambulatory Visit (HOSPITAL_COMMUNITY): Payer: BC Managed Care – PPO | Admitting: Physical Therapy

## 2019-06-25 DIAGNOSIS — R29898 Other symptoms and signs involving the musculoskeletal system: Secondary | ICD-10-CM

## 2019-06-25 DIAGNOSIS — M5441 Lumbago with sciatica, right side: Secondary | ICD-10-CM | POA: Diagnosis not present

## 2019-06-25 DIAGNOSIS — M6281 Muscle weakness (generalized): Secondary | ICD-10-CM

## 2019-06-25 DIAGNOSIS — G8929 Other chronic pain: Secondary | ICD-10-CM

## 2019-06-25 NOTE — Therapy (Signed)
[place], [place], [address] Phone: [phone]   Fax:  [phone]  Physical Therapy Treatment  Patient Details  Name: Riley Larson MRN: [id number] Date of Birth: [birth year] Referring Provider (PT): Emelda Brothers, MD   Encounter Date: 06/25/2019  PT End of Session - 06/25/19 0930    Visit Number  3    Number of Visits  12    Date for PT Re-Evaluation  07/29/19    Authorization Type  Primary: [employer]; Secondary: [employer]    Authorization Time Period  06/17/19 - 07/29/19    Progress Note Due on Visit  10    PT Start Time  0906    PT Stop Time  0944    PT Time Calculation (min)  38 min    Activity Tolerance  Patient tolerated treatment well    Behavior During Therapy  [facility] for tasks assessed/performed       History reviewed. No pertinent past medical history.  History reviewed. No pertinent surgical history.  There were no vitals filed for this visit.  Subjective Assessment - 06/25/19 0929    Subjective  Patient reported soreness which he rates a 5/10 around the scar.    Limitations  Sitting;Standing;Walking;House hold activities    Diagnostic tests  MRI    Patient Stated Goals  To have less pain    Currently in Pain?  Yes    Pain Score  5     Pain Location  Back    Pain Orientation  Lower    Pain Descriptors / Indicators  Aching                       [facility] Adult PT Treatment/Exercise - 06/25/19 0001      Lumbar Exercises: Stretches   Passive Hamstring Stretch  Right;Left;3 reps;30 seconds    Passive Hamstring Stretch Limitations  with strap      Lumbar Exercises: Supine   Pelvic Tilt  10 reps;5 seconds    Pelvic Tilt Limitations  Posterior. Verbal and tactile cues for form.       Manual Therapy   Manual Therapy  Soft tissue mobilization;Myofascial release    Manual therapy comments  All manual completed separately from other skilled interventions    Soft tissue  mobilization  To lumbar paraspinals for pain relief    Myofascial Release  Around scar to decrease pain and promote relaxation               PT Short Term Goals - 06/24/19 1321      PT SHORT TERM GOAL #1   Title  Patient will report understanding and regular compliance with HEP to improve mobility and decrease pain.    Time  3    Period  Weeks    Status  On-going    Target Date  07/08/19      PT SHORT TERM GOAL #2   Title  Patient will report that his pain level has not exceeded a 7/10 over the course of a 1 week period indicating improved tolerance to daily activities.    Time  3    Period  Weeks    Status  On-going    Target Date  07/08/19        PT Long Term Goals - 06/24/19 1321      PT LONG TERM GOAL #1   Title  Patient will report an improvement of at least 50%  in overall symptoms  to improve QOL.    Time  6    Period  Weeks    Status  On-going      PT LONG TERM GOAL #2   Title  Patient will report that his RT leg pain has not extended past his knee for at least a week indicating improved centralization of symptoms.    Time  6    Period  Weeks    Status  On-going      PT LONG TERM GOAL #3   Title  Patient will report that his pain level has not exceeded a 3/10 over the course of a 1 week period indicating improved tolerance to daily activities.    Time  6    Period  Weeks    Status  On-going            Plan - 06/25/19 1030    Clinical Impression Statement  Patient presented with increased pain this date so began with manual for pain relief. Patient reported a decrease in pain following manual. Introduced posterior pelvic tilts this session with verbal and tactile cues for proper form. Patient demonstrated understanding following cueing. Patient would benefit from continued skilled physical therapy in order to continue progressing towards functional goal.    Personal Factors and Comorbidities  Time since onset of  injury/illness/exacerbation;Comorbidity 1    Comorbidities  HTN    Examination-Activity Limitations  Stand;Lift;Locomotion Level;Bend;Reach Overhead;Squat;Stairs    Examination-Participation Restrictions  Yard Work;Meal Prep;Cleaning;Community Activity    Stability/Clinical Decision Making  Stable/Uncomplicated    Rehab Potential  Good    PT Frequency  2x / week    PT Duration  6 weeks    PT Treatment/Interventions  ADLs/Self Care Home Management;Aquatic Therapy;Cryotherapy;Electrical Stimulation;Moist Heat;Traction;DME Instruction;Gait training;Stair training;Functional mobility training;Therapeutic activities;Therapeutic exercise;Balance training;Neuromuscular re-education;Patient/family education;Orthotic Fit/Training;Manual techniques;Passive range of motion;Scar mobilization;Dry needling;Energy conservation;Taping    PT Next Visit Plan  Continue manual PRN. Continue abdominal strengthening and hip strengthening. Trial progressing to some standing exercises consider pallof press as able.    PT Home Exercise Plan  06/17/19: Ab set with legs at 90/90 20x 5'' holds; 06/24/19: HS stretch 3x20'' each LE    Consulted and Agree with Plan of Care  Patient       Patient will benefit from skilled therapeutic intervention in order to improve the following deficits and impairments:  Abnormal gait, Increased fascial restricitons, Pain, Improper body mechanics, Decreased mobility, Postural dysfunction, Decreased activity tolerance, Decreased endurance, Decreased range of motion, Decreased strength, Hypomobility  Visit Diagnosis: Chronic bilateral low back pain with right-sided sciatica  Muscle weakness (generalized)  Other symptoms and signs involving the musculoskeletal system     Problem List Patient Active Problem List   Diagnosis Date Noted  . CELLULITIS, LEFT HAND 10/16/2009   [name] PT, DPT 10:31 AM, 06/25/19 [place] [address] [place], [place], [address] Phone: [phone]   Fax:  [phone]  Name: JAHMAR DOBISH MRN: [id number] Date of Birth: Jun 08, 1970

## 2019-06-28 ENCOUNTER — Telehealth (HOSPITAL_COMMUNITY): Payer: Self-pay | Admitting: Physical Therapy

## 2019-06-28 NOTE — Telephone Encounter (Signed)
pt has another appt elsewhere called to cancel

## 2019-06-29 ENCOUNTER — Ambulatory Visit (HOSPITAL_COMMUNITY): Payer: BC Managed Care – PPO | Admitting: Physical Therapy

## 2019-07-01 ENCOUNTER — Encounter (HOSPITAL_COMMUNITY): Payer: Self-pay | Admitting: Physical Therapy

## 2019-07-01 ENCOUNTER — Ambulatory Visit (HOSPITAL_COMMUNITY): Payer: BC Managed Care – PPO | Admitting: Physical Therapy

## 2019-07-01 ENCOUNTER — Other Ambulatory Visit: Payer: Self-pay

## 2019-07-01 DIAGNOSIS — R29898 Other symptoms and signs involving the musculoskeletal system: Secondary | ICD-10-CM

## 2019-07-01 DIAGNOSIS — G8929 Other chronic pain: Secondary | ICD-10-CM

## 2019-07-01 DIAGNOSIS — M5441 Lumbago with sciatica, right side: Secondary | ICD-10-CM | POA: Diagnosis not present

## 2019-07-01 DIAGNOSIS — M6281 Muscle weakness (generalized): Secondary | ICD-10-CM

## 2019-07-01 NOTE — Patient Instructions (Signed)
Access Code: [email or web address] URL: [email or web address] Date: 07/01/2019 Prepared by: Mitzi Hansen Humaira Sculley  Exercises Standing Anti-Rotation Press with Anchored Resistance - 1 x daily - 7 x weekly - 2 sets - 10 reps Lateral Shift Correction at Wall - 1 x daily - 7 x weekly - 1 sets - 10 reps

## 2019-07-01 NOTE — Therapy (Signed)
[place], [place], [address] Phone: [phone]   Fax:  [phone]  Physical Therapy Treatment  Patient Details  Name: Riley Larson MRN: [id number] Date of Birth: 02-10-1971 Referring Provider (PT): Emelda Brothers, MD   Encounter Date: 07/01/2019    History reviewed. No pertinent past medical history.  History reviewed. No pertinent surgical history.  There were no vitals filed for this visit.  Subjective Assessment - 07/01/19 0825    Subjective  Patient reports things are going well. He woke up and was feeling good and then back/leg while in the shower. Pain is worse in his leg while in his SUV. His home exercises are going well.    Limitations  Sitting;Standing;Walking;House hold activities    Diagnostic tests  MRI    Patient Stated Goals  To have less pain    Currently in Pain?  Yes    Pain Score  4    4.5   Pain Location  Back    Pain Orientation  Lower                       [facility] Adult PT Treatment/Exercise - 07/01/19 0001      Lumbar Exercises: Standing   Other Standing Lumbar Exercises  lumbar extension at wall 10x 2-3 second holds, side glides to the right at wall 1x10    Other Standing Lumbar Exercises  palof press green band 2x10 bilateral       Lumbar Exercises: Supine   Ab Set  10 reps    AB Set Limitations  5 second holds    Bridge  10 reps    Bridge Limitations  verbal cueing for ab set and glute contraction      Lumbar Exercises: Prone   Other Prone Lumbar Exercises  Prone on elbows 5x20 second holds      Manual Therapy   Manual Therapy  Soft tissue mobilization    Manual therapy comments  All manual completed separately from other skilled interventions    Soft tissue mobilization  To lumbar paraspinals for pain relief; self stm instruction and performance             PT Education - 07/01/19 0904    Education Details  Educated on HEP and exercise  mechanics and self STM with tennis ball    Person(s) Educated  Patient    Methods  Explanation;Handout;Demonstration    Comprehension  Verbalized understanding       PT Short Term Goals - 06/24/19 1321      PT SHORT TERM GOAL #1   Title  Patient will report understanding and regular compliance with HEP to improve mobility and decrease pain.    Time  3    Period  Weeks    Status  On-going    Target Date  07/08/19      PT SHORT TERM GOAL #2   Title  Patient will report that his pain level has not exceeded a 7/10 over the course of a 1 week period indicating improved tolerance to daily activities.    Time  3    Period  Weeks    Status  On-going    Target Date  07/08/19        PT Long Term Goals - 06/24/19 1321      PT LONG TERM GOAL #1   Title  Patient will report an improvement of at least 50% in overall symptoms  to improve QOL.    Time  6    Period  Weeks    Status  On-going      PT LONG TERM GOAL #2   Title  Patient will report that his RT leg pain has not extended past his knee for at least a week indicating improved centralization of symptoms.    Time  6    Period  Weeks    Status  On-going      PT LONG TERM GOAL #3   Title  Patient will report that his pain level has not exceeded a 3/10 over the course of a 1 week period indicating improved tolerance to daily activities.    Time  6    Period  Weeks    Status  On-going            Plan - 07/01/19 0959    Clinical Impression Statement  Patient experiences no change in symptoms following repeated prone on elbows and extension at wall. He is able to engage core and complete bridge exercise without increase in symptoms. He requires some verbal cueing for prior glute activation with bridge and breathing with core activation. He demonstrates good form and mechanics with palof exercise and has no increase in symptoms. He feels reduction in muscle tension following manual therapy. He tolerates side glides well with no  increase in symptoms and is educated on discontinuing exercise if symptoms increase. Patient will continue to benefit from skilled physical therapy in order to reduce impairment and improve function.    Personal Factors and Comorbidities  Time since onset of injury/illness/exacerbation;Comorbidity 1    Comorbidities  HTN    Examination-Activity Limitations  Stand;Lift;Locomotion Level;Bend;Reach Overhead;Squat;Stairs    Examination-Participation Restrictions  Yard Work;Meal Prep;Cleaning;Community Activity    Stability/Clinical Decision Making  Stable/Uncomplicated    Rehab Potential  Good    PT Frequency  2x / week    PT Duration  6 weeks    PT Treatment/Interventions  ADLs/Self Care Home Management;Aquatic Therapy;Cryotherapy;Electrical Stimulation;Moist Heat;Traction;DME Instruction;Gait training;Stair training;Functional mobility training;Therapeutic activities;Therapeutic exercise;Balance training;Neuromuscular re-education;Patient/family education;Orthotic Fit/Training;Manual techniques;Passive range of motion;Scar mobilization;Dry needling;Energy conservation;Taping    PT Next Visit Plan  Continue manual PRN. Continue abdominal strengthening and hip strengthening. Trial progressing to some standing exercises    PT Home Exercise Plan  06/17/19: Ab set with legs at 90/90 20x 5'' holds; 06/24/19: HS stretch 3x20'' each LE 07/01/19 self stm with tennis ball, side glides    Consulted and Agree with Plan of Care  Patient       Patient will benefit from skilled therapeutic intervention in order to improve the following deficits and impairments:  Abnormal gait, Increased fascial restricitons, Pain, Improper body mechanics, Decreased mobility, Postural dysfunction, Decreased activity tolerance, Decreased endurance, Decreased range of motion, Decreased strength, Hypomobility  Visit Diagnosis: Chronic bilateral low back pain with right-sided sciatica  Muscle weakness (generalized)  Other symptoms and  signs involving the musculoskeletal system     Problem List Patient Active Problem List   Diagnosis Date Noted  . CELLULITIS, LEFT HAND 10/16/2009    10:02 AM, 07/01/19 Mearl Latin PT, DPT Physical Therapist at [place], [place], [address] Phone: [phone]   Fax:  [phone]  Name: LAKENDRIC PAPALIA MRN: [id number] Date of Birth: [birth year]

## 2019-07-06 ENCOUNTER — Ambulatory Visit (HOSPITAL_COMMUNITY): Payer: BC Managed Care – PPO | Admitting: Physical Therapy

## 2019-07-06 ENCOUNTER — Other Ambulatory Visit: Payer: Self-pay

## 2019-07-06 ENCOUNTER — Encounter (HOSPITAL_COMMUNITY): Payer: Self-pay | Admitting: Physical Therapy

## 2019-07-06 DIAGNOSIS — G8929 Other chronic pain: Secondary | ICD-10-CM

## 2019-07-06 DIAGNOSIS — M5441 Lumbago with sciatica, right side: Secondary | ICD-10-CM | POA: Diagnosis not present

## 2019-07-06 DIAGNOSIS — R29898 Other symptoms and signs involving the musculoskeletal system: Secondary | ICD-10-CM

## 2019-07-06 DIAGNOSIS — M6281 Muscle weakness (generalized): Secondary | ICD-10-CM

## 2019-07-06 NOTE — Therapy (Signed)
[place], [place], [address] Phone: [phone]   Fax:  [phone]  Physical Therapy Treatment  Patient Details  Name: Riley Larson MRN: [id number] Date of Birth: 1970-05-17 Referring Provider (PT): Emelda Brothers, MD   Encounter Date: 07/06/2019  PT End of Session - 07/06/19 0913    Visit Number  5    Number of Visits  12    Date for PT Re-Evaluation  07/29/19    Authorization Type  Primary: BCBS; Secondary: [employer]    Authorization Time Period  06/17/19 - 07/29/19    Progress Note Due on Visit  10    PT Start Time  0906    PT Stop Time  0944    PT Time Calculation (min)  38 min    Activity Tolerance  Patient tolerated treatment well    Behavior During Therapy  [facility] for tasks assessed/performed       History reviewed. No pertinent past medical history.  History reviewed. No pertinent surgical history.  There were no vitals filed for this visit.  Subjective Assessment - 07/06/19 0908    Subjective  He reported that he hasn't had pain going his leg in about 3 days. He stated he is doing well, but just has some pain around the incision.    Limitations  Sitting;Standing;Walking;House hold activities    Diagnostic tests  MRI    Patient Stated Goals  To have less pain    Currently in Pain?  Yes    Pain Score  3     Pain Location  Back    Pain Orientation  Lower    Pain Descriptors / Indicators  Aching    Pain Type  Surgical pain                       [facility] Adult PT Treatment/Exercise - 07/06/19 0001      Lumbar Exercises: Standing   Other Standing Lumbar Exercises  lumbar extension at wall 10x 2-3 second holds, side glides to the right at wall 1x10    Other Standing Lumbar Exercises  pallof press green band 2x10 bilateral       Lumbar Exercises: Supine   Ab Set  10 reps    AB Set Limitations  5 second holds    Bridge  10 reps    Bridge Limitations  verbal cueing for ab  set and glute contraction      Lumbar Exercises: Prone   Other Prone Lumbar Exercises  Prone on elbows 5x20 second holds      Manual Therapy   Manual Therapy  Soft tissue mobilization    Manual therapy comments  All manual completed separately from other skilled interventions    Soft tissue mobilization  IASTM to lumbar paraspinals with massage gun setting 14 for pain relief               PT Short Term Goals - 06/24/19 1321      PT SHORT TERM GOAL #1   Title  Patient will report understanding and regular compliance with HEP to improve mobility and decrease pain.    Time  3    Period  Weeks    Status  On-going    Target Date  07/08/19      PT SHORT TERM GOAL #2   Title  Patient will report that his pain level has not exceeded a 7/10 over the course of a  1 week period indicating improved tolerance to daily activities.    Time  3    Period  Weeks    Status  On-going    Target Date  07/08/19        PT Long Term Goals - 06/24/19 1321      PT LONG TERM GOAL #1   Title  Patient will report an improvement of at least 50% in overall symptoms  to improve QOL.    Time  6    Period  Weeks    Status  On-going      PT LONG TERM GOAL #2   Title  Patient will report that his RT leg pain has not extended past his knee for at least a week indicating improved centralization of symptoms.    Time  6    Period  Weeks    Status  On-going      PT LONG TERM GOAL #3   Title  Patient will report that his pain level has not exceeded a 3/10 over the course of a 1 week period indicating improved tolerance to daily activities.    Time  6    Period  Weeks    Status  On-going            Plan - 07/06/19 1016    Clinical Impression Statement  Patient reported an improvement in radiating pain this session, however continued to have pain surrounding his incision. This date continued with manual, but trialed the use of the massage gun focusing around the incision site. Patient reported a  reduction in pain at the end of the session. Patient would benefit from continued skilled physical therapy to continue progressing towards functional goals.    Personal Factors and Comorbidities  Time since onset of injury/illness/exacerbation;Comorbidity 1    Comorbidities  HTN    Examination-Activity Limitations  Stand;Lift;Locomotion Level;Bend;Reach Overhead;Squat;Stairs    Examination-Participation Restrictions  Yard Work;Meal Prep;Cleaning;Community Activity    Stability/Clinical Decision Making  Stable/Uncomplicated    Rehab Potential  Good    PT Frequency  2x / week    PT Duration  6 weeks    PT Treatment/Interventions  ADLs/Self Care Home Management;Aquatic Therapy;Cryotherapy;Electrical Stimulation;Moist Heat;Traction;DME Instruction;Gait training;Stair training;Functional mobility training;Therapeutic activities;Therapeutic exercise;Balance training;Neuromuscular re-education;Patient/family education;Orthotic Fit/Training;Manual techniques;Passive range of motion;Scar mobilization;Dry needling;Energy conservation;Taping    PT Next Visit Plan  Continue manual PRN. Continue abdominal strengthening and hip strengthening. Consider trial lunges.    PT Home Exercise Plan  06/17/19: Ab set with legs at 90/90 20x 5'' holds; 06/24/19: HS stretch 3x20'' each LE 07/01/19 self stm with tennis ball, side glides    Consulted and Agree with Plan of Care  Patient       Patient will benefit from skilled therapeutic intervention in order to improve the following deficits and impairments:  Abnormal gait, Increased fascial restricitons, Pain, Improper body mechanics, Decreased mobility, Postural dysfunction, Decreased activity tolerance, Decreased endurance, Decreased range of motion, Decreased strength, Hypomobility  Visit Diagnosis: Chronic bilateral low back pain with right-sided sciatica  Muscle weakness (generalized)  Other symptoms and signs involving the musculoskeletal system     Problem  List Patient Active Problem List   Diagnosis Date Noted  . CELLULITIS, LEFT HAND 10/16/2009   [name] PT, DPT 10:19 AM, 07/06/19 [name], [place], [address] Phone: [phone]   Fax:  [phone]  Name: Riley Larson MRN: [id number] Date of Birth: [birth year]

## 2019-07-08 ENCOUNTER — Other Ambulatory Visit: Payer: Self-pay

## 2019-07-08 ENCOUNTER — Ambulatory Visit (HOSPITAL_COMMUNITY): Payer: BC Managed Care – PPO | Attending: Neurological Surgery | Admitting: Physical Therapy

## 2019-07-08 ENCOUNTER — Encounter (HOSPITAL_COMMUNITY): Payer: Self-pay | Admitting: Physical Therapy

## 2019-07-08 DIAGNOSIS — M6281 Muscle weakness (generalized): Secondary | ICD-10-CM

## 2019-07-08 DIAGNOSIS — M5441 Lumbago with sciatica, right side: Secondary | ICD-10-CM | POA: Diagnosis present

## 2019-07-08 DIAGNOSIS — G8929 Other chronic pain: Secondary | ICD-10-CM

## 2019-07-08 DIAGNOSIS — R29898 Other symptoms and signs involving the musculoskeletal system: Secondary | ICD-10-CM

## 2019-07-08 NOTE — Therapy (Signed)
[place], [place], [address] Phone: [phone]   Fax:  [phone]  Physical Therapy Treatment  Patient Details  Name: Riley Larson MRN: [id number] Date of Birth: September 19, 1970 Referring Provider (PT): Emelda Brothers, MD   Encounter Date: 07/08/2019  PT End of Session - 07/08/19 1306    Visit Number  6    Number of Visits  12    Date for PT Re-Evaluation  07/29/19    Authorization Type  Primary: [employer]; Secondary: [employer]    Authorization Time Period  06/17/19 - 07/29/19    Progress Note Due on Visit  10    PT Start Time  1301    PT Stop Time  1339    PT Time Calculation (min)  38 min    Activity Tolerance  Patient tolerated treatment well    Behavior During Therapy  [facility] for tasks assessed/performed       History reviewed. No pertinent past medical history.  History reviewed. No pertinent surgical history.  There were no vitals filed for this visit.  Subjective Assessment - 07/08/19 1305    Subjective  Patient reported having felt good after last treatment session, but reported some soreness in his right hip currently.    Limitations  Sitting;Standing;Walking;House hold activities    Diagnostic tests  MRI    Patient Stated Goals  To have less pain    Currently in Pain?  Yes    Pain Score  4     Pain Location  Hip    Pain Orientation  Right    Pain Descriptors / Indicators  Aching    Pain Type  Surgical pain                       [facility] Adult PT Treatment/Exercise - 07/08/19 0001      Lumbar Exercises: Stretches   Prone on Elbows Stretch  5 reps    Prone on Elbows Stretch Limitations  20 second holds      Lumbar Exercises: Standing   Other Standing Lumbar Exercises  Marching 2-3 seconds 1 UE assist x20 alternating legs. lumbar extension at wall 10x 2-3 second holds, side glides to the right at wall 1x10      Lumbar Exercises: Supine   Ab Set  10 reps    AB Set Limitations   5 second holds; legs at 90/90 on small green physioball    Other Supine Lumbar Exercises  Hamstring curl on small green physioball 3'' holds x15      Manual Therapy   Manual Therapy  Soft tissue mobilization    Manual therapy comments  All manual completed separately from other skilled interventions    Soft tissue mobilization  IASTM to lumbar paraspinals with massage gun setting 14 for pain relief round tip               PT Short Term Goals - 06/24/19 1321      PT SHORT TERM GOAL #1   Title  Patient will report understanding and regular compliance with HEP to improve mobility and decrease pain.    Time  3    Period  Weeks    Status  On-going    Target Date  07/08/19      PT SHORT TERM GOAL #2   Title  Patient will report that his pain level has not exceeded a 7/10 over the course of a 1 week period  indicating improved tolerance to daily activities.    Time  3    Period  Weeks    Status  On-going    Target Date  07/08/19        PT Long Term Goals - 06/24/19 1321      PT LONG TERM GOAL #1   Title  Patient will report an improvement of at least 50% in overall symptoms  to improve QOL.    Time  6    Period  Weeks    Status  On-going      PT LONG TERM GOAL #2   Title  Patient will report that his RT leg pain has not extended past his knee for at least a week indicating improved centralization of symptoms.    Time  6    Period  Weeks    Status  On-going      PT LONG TERM GOAL #3   Title  Patient will report that his pain level has not exceeded a 3/10 over the course of a 1 week period indicating improved tolerance to daily activities.    Time  6    Period  Weeks    Status  On-going            Plan - 07/08/19 1339    Clinical Impression Statement  Patient with increased hip soreness this session so began with manual therapy with patient reporting relief in hip pain following this. Added more hip directed exercises this session including sidelying clams for  eccentric strengthening of hip abductors and hamstring curl with a physioball. Patient denied any increase in pain with exercises. Patient would benefit from continued skilled physical therapy to continue progressing towards functional goals.    Personal Factors and Comorbidities  Time since onset of injury/illness/exacerbation;Comorbidity 1    Comorbidities  HTN    Examination-Activity Limitations  Stand;Lift;Locomotion Level;Bend;Reach Overhead;Squat;Stairs    Examination-Participation Restrictions  Yard Work;Meal Prep;Cleaning;Community Activity    Stability/Clinical Decision Making  Stable/Uncomplicated    Rehab Potential  Good    PT Frequency  2x / week    PT Duration  6 weeks    PT Treatment/Interventions  ADLs/Self Care Home Management;Aquatic Therapy;Cryotherapy;Electrical Stimulation;Moist Heat;Traction;DME Instruction;Gait training;Stair training;Functional mobility training;Therapeutic activities;Therapeutic exercise;Balance training;Neuromuscular re-education;Patient/family education;Orthotic Fit/Training;Manual techniques;Passive range of motion;Scar mobilization;Dry needling;Energy conservation;Taping    PT Next Visit Plan  Continue manual PRN. Continue abdominal strengthening and hip strengthening. Consider trial lunges.    PT Home Exercise Plan  06/17/19: Ab set with legs at 90/90 20x 5'' holds; 06/24/19: HS stretch 3x20'' each LE 07/01/19 self stm with tennis ball, side glides    Consulted and Agree with Plan of Care  Patient       Patient will benefit from skilled therapeutic intervention in order to improve the following deficits and impairments:  Abnormal gait, Increased fascial restricitons, Pain, Improper body mechanics, Decreased mobility, Postural dysfunction, Decreased activity tolerance, Decreased endurance, Decreased range of motion, Decreased strength, Hypomobility  Visit Diagnosis: Chronic bilateral low back pain with right-sided sciatica  Muscle weakness  (generalized)  Other symptoms and signs involving the musculoskeletal system     Problem List Patient Active Problem List   Diagnosis Date Noted  . CELLULITIS, LEFT HAND 10/16/2009   [name] PT, DPT 1:40 PM, 07/08/19 [place], [place], [address] Phone: [phone]   Fax:  [phone]  Name: DARR RANFT MRN: [id number] Date of Birth: 06-Oct-1970

## 2019-07-13 ENCOUNTER — Telehealth (HOSPITAL_COMMUNITY): Payer: Self-pay | Admitting: Physical Therapy

## 2019-07-13 ENCOUNTER — Ambulatory Visit (HOSPITAL_COMMUNITY): Payer: BC Managed Care – PPO | Admitting: Physical Therapy

## 2019-07-13 NOTE — Telephone Encounter (Signed)
pt lm at 1:10pm that he had to cancel his appt for today

## 2019-07-15 ENCOUNTER — Ambulatory Visit (HOSPITAL_COMMUNITY): Payer: BC Managed Care – PPO | Admitting: Physical Therapy

## 2019-07-15 ENCOUNTER — Telehealth (HOSPITAL_COMMUNITY): Payer: Self-pay | Admitting: Physical Therapy

## 2019-07-15 NOTE — Telephone Encounter (Signed)
pt stated he will not be able to make it to his appt today, no reason given

## 2019-07-20 ENCOUNTER — Ambulatory Visit (HOSPITAL_COMMUNITY): Payer: BC Managed Care – PPO | Admitting: Physical Therapy

## 2019-07-20 ENCOUNTER — Telehealth (HOSPITAL_COMMUNITY): Payer: Self-pay | Admitting: Physical Therapy

## 2019-07-20 NOTE — Telephone Encounter (Signed)
pt lmonvm to cancel today's appt no reason given

## 2019-07-22 ENCOUNTER — Telehealth (HOSPITAL_COMMUNITY): Payer: Self-pay | Admitting: Physical Therapy

## 2019-07-22 ENCOUNTER — Ambulatory Visit (HOSPITAL_COMMUNITY): Payer: BC Managed Care – PPO | Admitting: Physical Therapy

## 2019-07-22 NOTE — Telephone Encounter (Signed)
pt did call to cancel ahead of time sorry for the late ewsponse due to allergies

## 2019-07-27 ENCOUNTER — Ambulatory Visit (HOSPITAL_COMMUNITY): Payer: BC Managed Care – PPO | Admitting: Physical Therapy

## 2019-07-29 ENCOUNTER — Ambulatory Visit (HOSPITAL_COMMUNITY): Payer: BC Managed Care – PPO | Admitting: Physical Therapy

## 2019-12-07 ENCOUNTER — Encounter (HOSPITAL_COMMUNITY): Payer: Self-pay | Admitting: Physical Therapy

## 2019-12-07 NOTE — Therapy (Signed)
[place], [place], [address] Phone: [phone]   Fax:  [phone]  Patient Details  Name: Riley Larson MRN: [id number] Date of Birth: Oct 12, 1970 Referring Provider:  No ref. provider found  Encounter Date: 12/07/2019   PHYSICAL THERAPY DISCHARGE SUMMARY  Visits from Start of Care: 6   Current functional level related to goals / functional outcomes: Unknown as patient did not return for re-assessment.    Remaining deficits: Unknown as patient did not return for re-assessment.    Education / Equipment: HEP and benefits of therapy Plan: Patient agrees to discharge.  Patient goals were not met. Patient is being discharged due to not returning since the last visit.  ?????     [name] PT, DPT 3:11 PM, 12/07/19 [place], [place], [address] Phone: [phone]   Fax:  [phone]

## 2020-01-18 ENCOUNTER — Encounter (INDEPENDENT_AMBULATORY_CARE_PROVIDER_SITE_OTHER): Payer: Self-pay

## 2020-02-09 ENCOUNTER — Other Ambulatory Visit (INDEPENDENT_AMBULATORY_CARE_PROVIDER_SITE_OTHER): Payer: Self-pay

## 2020-02-09 DIAGNOSIS — Z1211 Encounter for screening for malignant neoplasm of colon: Secondary | ICD-10-CM

## 2020-02-11 ENCOUNTER — Telehealth (INDEPENDENT_AMBULATORY_CARE_PROVIDER_SITE_OTHER): Payer: Self-pay

## 2020-02-11 ENCOUNTER — Encounter (INDEPENDENT_AMBULATORY_CARE_PROVIDER_SITE_OTHER): Payer: Self-pay

## 2020-02-11 NOTE — Telephone Encounter (Signed)
Ok to schedule.  Thanks,  Glenetta Kiger Castaneda Mayorga, MD Gastroenterology and Hepatology  [facility]  

## 2020-02-11 NOTE — Telephone Encounter (Signed)
Please add the pharmacy and will send the prep

## 2020-02-11 NOTE — Telephone Encounter (Signed)
Patient has Plenvu (copay card)

## 2020-02-11 NOTE — Telephone Encounter (Signed)
Referring MD/PCP: Hilma Favors   Procedure: TCS  Reason/Indication:  Screening  Has patient had this procedure before?  no  If so, when, by whom and where?    Is there a family history of colon cancer?  no  Who?  What age when diagnosed?    Is patient diabetic?   no      Does patient have prosthetic heart valve or mechanical valve?  no  Do you have a pacemaker/defibrillator?  no  Has patient ever had endocarditis/atrial fibrillation? no  Does patient use oxygen? no  Has patient had joint replacement within last 12 months?  no  Is patient constipated or do they take laxatives? no  Does patient have a history of alcohol/drug use?  yes  Is patient on blood thinner such as Coumadin, Plavix and/or Aspirin? no  Medications: Cyclobenzaprine 10mg  prn, lisinopril/hctz 20/12.5mg  daily, celecoxib 200 mg daily  Allergies: nkda  Medication Adjustment per Dr Jenetta Downer none  Procedure date & time: 03/14/20 9:15

## 2020-02-14 ENCOUNTER — Encounter (INDEPENDENT_AMBULATORY_CARE_PROVIDER_SITE_OTHER): Payer: Self-pay | Admitting: *Deleted

## 2020-02-14 MED ORDER — PLENVU 140 G PO SOLR: 1.0000 | Freq: Once | ORAL | 0 refills | Status: AC

## 2020-02-14 NOTE — Telephone Encounter (Signed)
thaks

## 2020-02-14 NOTE — Telephone Encounter (Signed)
[facility] I added it in his chart

## 2020-03-13 ENCOUNTER — Telehealth (INDEPENDENT_AMBULATORY_CARE_PROVIDER_SITE_OTHER): Payer: Self-pay

## 2020-03-13 ENCOUNTER — Other Ambulatory Visit (HOSPITAL_COMMUNITY)
Admission: RE | Admit: 2020-03-13 | Discharge: 2020-03-13 | Disposition: A | Discharge status: Home / Self Care | Payer: BC Managed Care – PPO | Source: Ambulatory Visit | Attending: Gastroenterology | Admitting: Gastroenterology

## 2020-03-13 ENCOUNTER — Other Ambulatory Visit: Payer: Self-pay

## 2020-03-13 DIAGNOSIS — I10 Essential (primary) hypertension: Secondary | ICD-10-CM | POA: Diagnosis not present

## 2020-03-13 DIAGNOSIS — K5730 Diverticulosis of large intestine without perforation or abscess without bleeding: Secondary | ICD-10-CM | POA: Diagnosis not present

## 2020-03-13 DIAGNOSIS — Z79899 Other long term (current) drug therapy: Secondary | ICD-10-CM | POA: Diagnosis not present

## 2020-03-13 DIAGNOSIS — Z1211 Encounter for screening for malignant neoplasm of colon: Secondary | ICD-10-CM

## 2020-03-13 DIAGNOSIS — Z20822 Contact with and (suspected) exposure to covid-19: Secondary | ICD-10-CM | POA: Diagnosis not present

## 2020-03-13 DIAGNOSIS — Z9884 Bariatric surgery status: Secondary | ICD-10-CM | POA: Diagnosis not present

## 2020-03-13 DIAGNOSIS — K648 Other hemorrhoids: Secondary | ICD-10-CM | POA: Diagnosis not present

## 2020-03-13 DIAGNOSIS — Z01812 Encounter for preprocedural laboratory examination: Secondary | ICD-10-CM | POA: Insufficient documentation

## 2020-03-13 LAB — BASIC METABOLIC PANEL
Anion gap: 11 (ref 5–15)
BUN: 6 mg/dL (ref 6–20)
CO2: 25 mmol/L (ref 22–32)
Calcium: 8.9 mg/dL (ref 8.9–10.3)
Chloride: 98 mmol/L (ref 98–111)
Creatinine, Ser: 0.69 mg/dL (ref 0.61–1.24)
GFR, Estimated: >60 mL/min (ref >60)
Glucose, Bld: 94 mg/dL (ref 70–99)
Potassium: 4.3 mmol/L (ref 3.5–5.1)
Sodium: 134 mmol/L — ABNORMAL LOW (ref 135–145)

## 2020-03-13 LAB — SARS CORONAVIRUS 2 (TAT 6-24 HRS): SARS Coronavirus 2: NEGATIVE

## 2020-03-13 MED ORDER — NA SULFATE-K SULFATE-MG SULF 17.5-3.13-1.6 GM/177ML PO SOLN: 354.0000 mL | Freq: Once | ORAL | 0 refills | Status: AC

## 2020-03-13 MED ORDER — PLENVU 140 G PO SOLR: 280.0000 g | Freq: Once | ORAL | 0 refills | Status: AC

## 2020-03-13 NOTE — Telephone Encounter (Signed)
LeighAnn Manika Hast, CMA  

## 2020-03-13 NOTE — Telephone Encounter (Signed)
LeighAnn Cylus [place], CMA  

## 2020-03-14 ENCOUNTER — Ambulatory Visit (HOSPITAL_COMMUNITY)
Admission: RE | Admit: 2020-03-14 | Discharge: 2020-03-14 | Disposition: A | Discharge status: Home / Self Care | Payer: BC Managed Care – PPO | Attending: Gastroenterology | Admitting: Gastroenterology

## 2020-03-14 ENCOUNTER — Encounter (HOSPITAL_COMMUNITY): Payer: Self-pay | Admitting: Gastroenterology

## 2020-03-14 ENCOUNTER — Encounter (INDEPENDENT_AMBULATORY_CARE_PROVIDER_SITE_OTHER): Payer: Self-pay | Admitting: *Deleted

## 2020-03-14 ENCOUNTER — Ambulatory Visit (HOSPITAL_COMMUNITY): Payer: BC Managed Care – PPO | Admitting: Anesthesiology

## 2020-03-14 ENCOUNTER — Other Ambulatory Visit: Payer: Self-pay

## 2020-03-14 ENCOUNTER — Encounter (HOSPITAL_COMMUNITY)
Admission: RE | Disposition: A | Discharge status: Home / Self Care | Payer: Self-pay | Source: Home / Self Care | Attending: Gastroenterology

## 2020-03-14 DIAGNOSIS — K5730 Diverticulosis of large intestine without perforation or abscess without bleeding: Secondary | ICD-10-CM | POA: Diagnosis not present

## 2020-03-14 DIAGNOSIS — Z9884 Bariatric surgery status: Secondary | ICD-10-CM | POA: Insufficient documentation

## 2020-03-14 DIAGNOSIS — Z1211 Encounter for screening for malignant neoplasm of colon: Secondary | ICD-10-CM | POA: Diagnosis not present

## 2020-03-14 DIAGNOSIS — Z20822 Contact with and (suspected) exposure to covid-19: Secondary | ICD-10-CM | POA: Insufficient documentation

## 2020-03-14 DIAGNOSIS — Z79899 Other long term (current) drug therapy: Secondary | ICD-10-CM | POA: Insufficient documentation

## 2020-03-14 DIAGNOSIS — K648 Other hemorrhoids: Secondary | ICD-10-CM

## 2020-03-14 DIAGNOSIS — I10 Essential (primary) hypertension: Secondary | ICD-10-CM | POA: Insufficient documentation

## 2020-03-14 HISTORY — PX: COLONOSCOPY WITH PROPOFOL: SHX5780

## 2020-03-14 HISTORY — DX: Essential (primary) hypertension: I10

## 2020-03-14 LAB — HM COLONOSCOPY

## 2020-03-14 SURGERY — COLONOSCOPY WITH PROPOFOL
Anesthesia: General

## 2020-03-14 MED ORDER — LACTATED RINGERS IV SOLN: INTRAVENOUS | Status: DC | PRN

## 2020-03-14 MED ORDER — LACTATED RINGERS IV SOLN: Freq: Once | INTRAVENOUS | Status: AC

## 2020-03-14 MED ORDER — CHLORHEXIDINE GLUCONATE CLOTH 2 % EX PADS: 6.0000 | MEDICATED_PAD | Freq: Once | CUTANEOUS | Status: DC

## 2020-03-14 MED ORDER — PROPOFOL 10 MG/ML IV BOLUS
INTRAVENOUS | Status: DC | PRN
  Administered 2020-03-14: 150 ug/kg/min via INTRAVENOUS
  Administered 2020-03-14: 100 mg via INTRAVENOUS

## 2020-03-14 MED ORDER — LIDOCAINE HCL (CARDIAC) PF 100 MG/5ML IV SOSY
PREFILLED_SYRINGE | INTRAVENOUS | Status: DC | PRN
  Administered 2020-03-14: 100 mg via INTRAVENOUS

## 2020-03-14 NOTE — Transfer of Care (Signed)
Immediate Anesthesia Transfer of Care Note  Patient: Riley Larson  Procedure(s) Performed: COLONOSCOPY WITH PROPOFOL (N/A )  Patient Location: Endoscopy Unit  Anesthesia Type:General  Level of Consciousness: awake, alert , oriented and patient cooperative  Airway & Oxygen Therapy: Patient Spontanous Breathing  Post-op Assessment: Report given to RN, Post -op Vital signs reviewed and stable and Patient moving all extremities  Post vital signs: Reviewed and stable  Last Vitals:  Vitals Value Taken Time  BP    Temp    Pulse    Resp    SpO2      Last Pain:  Vitals:   03/14/20 0732  TempSrc: Oral  PainSc: 0-No pain      Patients Stated Pain Goal: 7 (02/62/85 4965)  Complications: No complications documented.

## 2020-03-14 NOTE — Discharge Instructions (Signed)
Colonoscopy, Adult, Care After This sheet gives you information about how to care for yourself after your procedure. Your doctor may also give you more specific instructions. If you have problems or questions, call your doctor. What can I expect after the procedure? After the procedure, it is common to have:  A small amount of blood in your poop (stool) for 24 hours.  Some gas.  Mild cramping or bloating in your belly (abdomen). Follow these instructions at home: Eating and drinking   Drink enough fluid to keep your pee (urine) pale yellow.  Follow instructions from your doctor about what you cannot eat or drink.  Return to your normal diet as told by your doctor. Avoid heavy or fried foods that are hard to digest. Activity  Rest as told by your doctor.  Do not sit for a long time without moving. Get up to take short walks every 1-2 hours. This is important. Ask for help if you feel weak or unsteady.  Return to your normal activities as told by your doctor. Ask your doctor what activities are safe for you. To help cramping and bloating:   Try walking around.  Put heat on your belly as told by your doctor. Use the heat source that your doctor recommends, such as a moist heat pack or a heating pad. ? Put a towel between your skin and the heat source. ? Leave the heat on for 20-30 minutes. ? Remove the heat if your skin turns bright red. This is very important if you are unable to feel pain, heat, or cold. You may have a greater risk of getting burned. General instructions  For the first 24 hours after the procedure: ? Do not drive or use machinery. ? Do not sign important documents. ? Do not drink alcohol. ? Do your daily activities more slowly than normal. ? Eat foods that are soft and easy to digest.  Take over-the-counter or prescription medicines only as told by your doctor.  Keep all follow-up visits as told by your doctor. This is important. Contact a doctor  if:  You have blood in your poop 2-3 days after the procedure. Get help right away if:  You have more than a small amount of blood in your poop.  You see large clumps of tissue (blood clots) in your poop.  Your belly is swollen.  You feel like you may vomit (nauseous).  You vomit.  You have a fever.  You have belly pain that gets worse, and medicine does not help your pain. Summary  After the procedure, it is common to have a small amount of blood in your poop. You may also have mild cramping and bloating in your belly.  For the first 24 hours after the procedure, do not drive or use machinery, do not sign important documents, and do not drink alcohol.  Get help right away if you have a lot of blood in your poop, feel like you may vomit, have a fever, or have more belly pain. This information is not intended to replace advice given to you by your health care provider. Make sure you discuss any questions you have with your health care provider. Document Revised: 10/19/2018 Document Reviewed: 10/19/2018 Elsevier Patient Education  [place]. High-Fiber Diet Fiber, also called dietary fiber, is a type of carbohydrate that is found in fruits, vegetables, whole grains, and beans. A high-fiber diet can have many health benefits. Your health care provider may recommend a high-fiber diet to  help:  Prevent constipation. Fiber can make your bowel movements more regular.  Lower your cholesterol.  Relieve the following conditions: ? Swelling of veins in the anus (hemorrhoids). ? Swelling and irritation (inflammation) of specific areas of the digestive tract (uncomplicated diverticulosis). ? A problem of the large intestine (colon) that sometimes causes pain and diarrhea (irritable bowel syndrome, IBS).  Prevent overeating as part of a weight-loss plan.  Prevent heart disease, type 2 diabetes, and certain cancers. What is my plan? The recommended daily fiber intake in grams  (g) includes:  38 g for men age 85 or younger.  30 g for men over age 60.  106 g for women age 50 or younger.  21 g for women over age 71. You can get the recommended daily intake of dietary fiber by:  Eating a variety of fruits, vegetables, grains, and beans.  Taking a fiber supplement, if it is not possible to get enough fiber through your diet. What do I need to know about a high-fiber diet?  It is better to get fiber through food sources rather than from fiber supplements. There is not a lot of research about how effective supplements are.  Always check the fiber content on the nutrition facts label of any prepackaged food. Look for foods that contain 5 g of fiber or more per serving.  Talk with a diet and nutrition specialist (dietitian) if you have questions about specific foods that are recommended or not recommended for your medical condition, especially if those foods are not listed below.  Gradually increase how much fiber you consume. If you increase your intake of dietary fiber too quickly, you may have bloating, cramping, or gas.  Drink plenty of water. Water helps you to digest fiber. What are tips for following this plan?  Eat a wide variety of high-fiber foods.  Make sure that half of the grains that you eat each day are whole grains.  Eat breads and cereals that are made with whole-grain flour instead of refined flour or white flour.  Eat brown rice, bulgur wheat, or millet instead of white rice.  Start the day with a breakfast that is high in fiber, such as a cereal that contains 5 g of fiber or more per serving.  Use beans in place of meat in soups, salads, and pasta dishes.  Eat high-fiber snacks, such as berries, raw vegetables, nuts, and popcorn.  Choose whole fruits and vegetables instead of processed forms like juice or sauce. What foods can I eat?  Fruits Berries. Pears. Apples. Oranges. Avocado. Prunes and raisins. Dried figs. Vegetables Sweet  potatoes. Spinach. Kale. Artichokes. Cabbage. Broccoli. Cauliflower. Green peas. Carrots. Squash. Grains Whole-grain breads. Multigrain cereal. Oats and oatmeal. Brown rice. Barley. Bulgur wheat. [place]. Quinoa. Bran muffins. Popcorn. Rye wafer crackers. Meats and other proteins Navy, kidney, and pinto beans. Soybeans. Split peas. Lentils. Nuts and seeds. Dairy Fiber-fortified yogurt. Beverages Fiber-fortified soy milk. Fiber-fortified orange juice. Other foods Fiber bars. The items listed above may not be a complete list of recommended foods and beverages. Contact a dietitian for more options. What foods are not recommended? Fruits Fruit juice. Cooked, strained fruit. Vegetables Fried potatoes. Canned vegetables. Well-cooked vegetables. Grains White bread. Pasta made with refined flour. White rice. Meats and other proteins Fatty cuts of meat. Fried chicken or fried fish. Dairy Milk. Yogurt. Cream cheese. Sour cream. Fats and oils Butters. Beverages Soft drinks. Other foods Cakes and pastries. The items listed above may not be a complete list  of foods and beverages to avoid. Contact a dietitian for more information. Summary  Fiber is a type of carbohydrate. It is found in fruits, vegetables, whole grains, and beans.  There are many health benefits of eating a high-fiber diet, such as preventing constipation, lowering blood cholesterol, helping with weight loss, and reducing your risk of heart disease, diabetes, and certain cancers.  Gradually increase your intake of fiber. Increasing too fast can result in cramping, bloating, and gas. Drink plenty of water while you increase your fiber.  The best sources of fiber include whole fruits and vegetables, whole grains, nuts, seeds, and beans. This information is not intended to replace advice given to you by your health care provider. Make sure you discuss any questions you have with your health care provider. Document Revised:  01/27/2017 Document Reviewed: 01/27/2017 Elsevier Patient Education  [place]. Diverticulosis  Diverticulosis is a condition that develops when small pouches (diverticula) form in the wall of the large intestine (colon). The colon is where water is absorbed and stool (feces) is formed. The pouches form when the inside layer of the colon pushes through weak spots in the outer layers of the colon. You may have a few pouches or many of them. The pouches usually do not cause problems unless they become inflamed or infected. When this happens, the condition is called diverticulitis. What are the causes? The cause of this condition is not known. What increases the risk? The following factors may make you more likely to develop this condition:  Being older than age 25. Your risk for this condition increases with age. Diverticulosis is rare among people younger than age 65. By age 40, many people have it.  Eating a low-fiber diet.  Having frequent constipation.  Being overweight.  Not getting enough exercise.  Smoking.  Taking over-the-counter pain medicines, like aspirin and ibuprofen.  Having a family history of diverticulosis. What are the signs or symptoms? In most people, there are no symptoms of this condition. If you do have symptoms, they may include:  Bloating.  Cramps in the abdomen.  Constipation or diarrhea.  Pain in the lower left side of the abdomen. How is this diagnosed? Because diverticulosis usually has no symptoms, it is most often diagnosed during an exam for other colon problems. The condition may be diagnosed by:  Using a flexible scope to examine the colon (colonoscopy).  Taking an X-ray of the colon after dye has been put into the colon (barium enema).  Having a CT scan. How is this treated? You may not need treatment for this condition. Your health care provider may recommend treatment to prevent problems. You may need treatment if you have  symptoms or if you previously had diverticulitis. Treatment may include:  Eating a high-fiber diet.  Taking a fiber supplement.  Taking a live bacteria supplement (probiotic).  Taking medicine to relax your colon. Follow these instructions at home: Medicines  Take over-the-counter and prescription medicines only as told by your health care provider.  If told by your health care provider, take a fiber supplement or probiotic. Constipation prevention Your condition may cause constipation. To prevent or treat constipation, you may need to:  Drink enough fluid to keep your urine pale yellow.  Take over-the-counter or prescription medicines.  Eat foods that are high in fiber, such as beans, whole grains, and fresh fruits and vegetables.  Limit foods that are high in fat and processed sugars, such as fried or sweet foods.  General instructions  Try not to strain when you have a bowel movement.  Keep all follow-up visits as told by your health care provider. This is important. Contact a health care provider if you:  Have pain in your abdomen.  Have bloating.  Have cramps.  Have not had a bowel movement in 3 days. Get help right away if:  Your pain gets worse.  Your bloating becomes very bad.  You have a fever or chills, and your symptoms suddenly get worse.  You vomit.  You have bowel movements that are bloody or black.  You have bleeding from your rectum. Summary  Diverticulosis is a condition that develops when small pouches (diverticula) form in the wall of the large intestine (colon).  You may have a few pouches or many of them.  This condition is most often diagnosed during an exam for other colon problems.  Treatment may include increasing the fiber in your diet, taking supplements, or taking medicines. This information is not intended to replace advice given to you by your health care provider. Make sure you discuss any questions you have with your health  care provider. Document Revised: 10/22/2018 Document Reviewed: 10/22/2018 Elsevier Patient Education  [place]. Hemorrhoids Hemorrhoids are swollen veins that may develop:  In the butt (rectum). These are called internal hemorrhoids.  Around the opening of the butt (anus). These are called external hemorrhoids. Hemorrhoids can cause pain, itching, or bleeding. Most of the time, they do not cause serious problems. They usually get better with diet changes, lifestyle changes, and other home treatments. What are the causes? This condition may be caused by:  Having trouble pooping (constipation).  Pushing hard (straining) to poop.  Watery poop (diarrhea).  Pregnancy.  Being very overweight (obese).  Sitting for long periods of time.  Heavy lifting or other activity that causes you to strain.  Anal sex.  Riding a bike for a long period of time. What are the signs or symptoms? Symptoms of this condition include:  Pain.  Itching or soreness in the butt.  Bleeding from the butt.  Leaking poop.  Swelling in the area.  One or more lumps around the opening of your butt. How is this diagnosed? A doctor can often diagnose this condition by looking at the affected area. The doctor may also:  Do an exam that involves feeling the area with a gloved hand (digital rectal exam).  Examine the area inside your butt using a small tube (anoscope).  Order blood tests. This may be done if you have lost a lot of blood.  Have you get a test that involves looking inside the colon using a flexible tube with a camera on the end (sigmoidoscopy or colonoscopy). How is this treated? This condition can usually be treated at home. Your doctor may tell you to change what you eat, make lifestyle changes, or try home treatments. If these do not help, procedures can be done to remove the hemorrhoids or make them smaller. These may involve:  Placing rubber bands at the base of the  hemorrhoids to cut off their blood supply.  Injecting medicine into the hemorrhoids to shrink them.  Shining a type of light energy onto the hemorrhoids to cause them to fall off.  Doing surgery to remove the hemorrhoids or cut off their blood supply. Follow these instructions at home: Eating and drinking   Eat foods that have a lot of fiber in them. These include whole grains, beans, nuts, fruits, and vegetables.  Ask  your doctor about taking products that have added fiber (fibersupplements).  Reduce the amount of fat in your diet. You can do this by: ? Eating low-fat dairy products. ? Eating less red meat. ? Avoiding processed foods.  Drink enough fluid to keep your pee (urine) pale yellow. Managing pain and swelling   Take a warm-water bath (sitz bath) for 20 minutes to ease pain. Do this 3-4 times a day. You may do this in a bathtub or using a portable sitz bath that fits over the toilet.  If told, put ice on the painful area. It may be helpful to use ice between your warm baths. ? Put ice in a plastic bag. ? Place a towel between your skin and the bag. ? Leave the ice on for 20 minutes, 2-3 times a day. General instructions  Take over-the-counter and prescription medicines only as told by your doctor. ? Medicated creams and medicines may be used as told.  Exercise often. Ask your doctor how much and what kind of exercise is best for you.  Go to the bathroom when you have the urge to poop. Do not wait.  Avoid pushing too hard when you poop.  Keep your butt dry and clean. Use wet toilet paper or moist towelettes after pooping.  Do not sit on the toilet for a long time.  Keep all follow-up visits as told by your doctor. This is important. Contact a doctor if you:  Have pain and swelling that do not get better with treatment or medicine.  Have trouble pooping.  Cannot poop.  Have pain or swelling outside the area of the hemorrhoids. Get help right away if you  have:  Bleeding that will not stop. Summary  Hemorrhoids are swollen veins in the butt or around the opening of the butt.  They can cause pain, itching, or bleeding.  Eat foods that have a lot of fiber in them. These include whole grains, beans, nuts, fruits, and vegetables.  Take a warm-water bath (sitz bath) for 20 minutes to ease pain. Do this 3-4 times a day. This information is not intended to replace advice given to you by your health care provider. Make sure you discuss any questions you have with your health care provider. Document Revised: 04/02/2018 Document Reviewed: 08/14/2017 Elsevier Patient Education  2020 [place] are being discharged to home.  Resume your previous diet.  Your physician has recommended a repeat colonoscopy in 10 years for screening purposes.

## 2020-03-14 NOTE — H&P (Signed)
Riley Larson is an 49 y.o. male.   Chief Complaint: screening colonoscopy HPI: 49 y/o M with PMH HTN, coming for screening colonoscopy. The patient has never had a colonoscopy in the past.  The patient denies having any complaints such as melena, hematochezia, abdominal pain or distention, change in her bowel movement consistency or frequency, no changes in her weight recently.  No family history of colorectal cancer.   Past Medical History:  Diagnosis Date  . Hypertension     Past Surgical History:  Procedure Laterality Date  . BACK SURGERY    . LAPAROSCOPIC GASTRIC BANDING      History reviewed. No pertinent family history. Social History:  reports that he has never smoked. He has never used smokeless tobacco. No history on file for alcohol use and drug use.  Allergies: No Known Allergies  Medications Prior to Admission  Medication Sig Dispense Refill  . colchicine 0.6 MG tablet Take 0.6 mg by mouth 2 (two) times daily as needed (gout).     Marland Kitchen lisinopril-hydrochlorothiazide (ZESTORETIC) 20-12.5 MG tablet Take 1 tablet by mouth daily.     . sildenafil (REVATIO) 20 MG tablet Take 60 mg by mouth daily as needed (ED).       Results for orders placed or performed during the hospital encounter of 03/13/20 (from the past 48 hour(s))  Basic metabolic panel     Status: Abnormal   Collection Time: 03/13/20  8:30 AM  Result Value Ref Range   Sodium 134 (L) 135 - 145 mmol/L   Potassium 4.3 3.5 - 5.1 mmol/L   Chloride 98 98 - 111 mmol/L   CO2 25 22 - 32 mmol/L   Glucose, Bld 94 70 - 99 mg/dL    Comment: Glucose reference range applies only to samples taken after fasting for at least 8 hours.   BUN 6 6 - 20 mg/dL   Creatinine, Ser 0.69 0.61 - 1.24 mg/dL   Calcium 8.9 8.9 - 10.3 mg/dL   GFR, Estimated >60 >60 mL/min    Comment: (NOTE) Calculated using the CKD-EPI Creatinine Equation (2021)    Anion gap 11 5 - 15    Comment: Performed at [facility], [address] [place],  [place] [address]  SARS CORONAVIRUS 2 (TAT 6-24 HRS) Nasopharyngeal Nasopharyngeal Swab     Status: None   Collection Time: 03/13/20  9:12 AM   Specimen: Nasopharyngeal Swab  Result Value Ref Range   SARS Coronavirus 2 NEGATIVE NEGATIVE    Comment: (NOTE) SARS-CoV-2 target nucleic acids are NOT DETECTED.  The SARS-CoV-2 RNA is generally detectable in upper and lower respiratory specimens during the acute phase of infection. Negative results do not preclude SARS-CoV-2 infection, do not rule out co-infections with other pathogens, and should not be used as the sole basis for treatment or other patient management decisions. Negative results must be combined with clinical observations, patient history, and epidemiological information. The expected result is Negative.  Fact Sheet for Patients: SugarRoll.be  Fact Sheet for Healthcare Providers: https://www.woods-mathews.com/  This test is not yet approved or cleared by the [place] FDA and  has been authorized for detection and/or diagnosis of SARS-CoV-2 by FDA under an Emergency Use Authorization (EUA). This EUA will remain  in effect (meaning this test can be used) for the duration of the COVID-19 declaration under Se ction 564(b)(1) of the Act, 21 U.S.C. section 360bbb-3(b)(1), unless the authorization is terminated or revoked sooner.  Performed at [facility], [place]  [address] [place], [place] [address]    No results found.  Review of Systems  Constitutional: Negative.   HENT: Negative.   Eyes: Negative.   Respiratory: Negative.   Cardiovascular: Negative.   Gastrointestinal: Negative.   Endocrine: Negative.   Genitourinary: Negative.   Musculoskeletal: Negative.   Skin: Negative.   Allergic/Immunologic: Negative.   Neurological: Negative.   Hematological: Negative.   Psychiatric/Behavioral: Negative.     Blood pressure (!) 159/92, pulse 81, temperature 98.1 F (36.7 C),  temperature source Oral, resp. rate 16, height 5\' 10"  (1.778 m), weight 98.4 kg, SpO2 99 %. Physical Exam  GENERAL: The patient is AO x3, in no acute distress. HEENT: Head is normocephalic and atraumatic. EOMI are intact. Mouth is well hydrated and without lesions. NECK: Supple. No masses LUNGS: Clear to auscultation. No presence of rhonchi/wheezing/rales. Adequate chest expansion HEART: RRR, normal s1 and s2. ABDOMEN: Soft, nontender, no guarding, no peritoneal signs, and nondistended. BS +. No masses. EXTREMITIES: Without any cyanosis, clubbing, rash, lesions or edema. NEUROLOGIC: AOx3, no focal motor deficit. SKIN: no jaundice, no rashes  Assessment/Plan 49 y/o M with PMH HTN, coming for screening colonoscopy.  The patient is at average risk for colorectal cancer.  We will proceed with colonoscopy today.   Harvel Quale, MD 03/14/2020, 8:25 AM

## 2020-03-14 NOTE — Anesthesia Postprocedure Evaluation (Signed)
Anesthesia Post Note  Patient: Riley Larson  Procedure(s) Performed: COLONOSCOPY WITH PROPOFOL (N/A )  Patient location during evaluation: PACU Anesthesia Type: General Level of consciousness: awake, oriented, awake and alert and patient cooperative Pain management: pain level controlled Vital Signs Assessment: post-procedure vital signs reviewed and stable Respiratory status: spontaneous breathing, respiratory function stable and nonlabored ventilation Cardiovascular status: blood pressure returned to baseline and stable Postop Assessment: no headache and no backache Anesthetic complications: no   No complications documented.   Last Vitals:  Vitals:   03/14/20 0732 03/14/20 0852  BP: (!) 159/92 (!) 107/53  Pulse: 81 65  Resp: 16 15  Temp: 36.7 C   SpO2: 99% 99%    Last Pain:  Vitals:   03/14/20 0852  TempSrc:   PainSc: 0-No pain                 Tacy Learn

## 2020-03-14 NOTE — Anesthesia Preprocedure Evaluation (Addendum)
Anesthesia Evaluation  Patient identified by MRN, date of birth, ID band Patient awake    Reviewed: Allergy & Precautions, NPO status , Patient's Chart, lab work & pertinent test results  Airway Mallampati: III  TM Distance: >3 FB Neck ROM: Full    Dental  (+) Dental Advisory Given [place] :   Pulmonary neg pulmonary ROS,    Pulmonary exam normal breath sounds clear to auscultation       Cardiovascular Exercise Tolerance: Good hypertension, Pt. on medications Normal cardiovascular exam Rhythm:Regular Rate:Normal     Neuro/Psych negative neurological ROS  negative psych ROS   GI/Hepatic Neg liver ROS, Bowel prep,Gastric banding   Endo/Other  negative endocrine ROS  Renal/GU negative Renal ROS  negative genitourinary   Musculoskeletal negative musculoskeletal ROS (+)   Abdominal   Peds  Hematology negative hematology ROS (+)   Anesthesia Other Findings   Reproductive/Obstetrics negative OB ROS                            Anesthesia Physical Anesthesia Plan  ASA: II  Anesthesia Plan: General   Post-op Pain Management:    Induction: Intravenous  PONV Risk Score and Plan: TIVA  Airway Management Planned: Nasal Cannula and Natural Airway  Additional Equipment:   Intra-op Plan:   Post-operative Plan:   Informed Consent: I have reviewed the patients History and Physical, chart, labs and discussed the procedure including the risks, benefits and alternatives for the proposed anesthesia with the patient or authorized representative who has indicated his/her understanding and acceptance.     Dental advisory given  Plan Discussed with: CRNA and Surgeon  Anesthesia Plan Comments:         Anesthesia Quick Evaluation

## 2020-03-14 NOTE — Op Note (Signed)
[facility] Patient Name: Riley Larson Procedure Date: 03/14/2020 8:09 AM MRN: [id number] Date of Birth: December 17, 1970 Attending MD: Maylon Peppers ,  CSN: [id number] Age: 49 Admit Type: Outpatient Procedure:                Colonoscopy Indications:              Screening for colorectal malignant neoplasm Providers:                Maylon Peppers, Janeece Riggers, RN, Nelma Rothman,                            Technician Referring MD:              Medicines:                Monitored Anesthesia Care Complications:            No immediate complications. Estimated Blood Loss:     Estimated blood loss: none. Procedure:                Pre-Anesthesia Assessment:                           - Prior to the procedure, a History and Physical                            was performed, and patient medications, allergies                            and sensitivities were reviewed. The patient's                            tolerance of previous anesthesia was reviewed.                           - The risks and benefits of the procedure and the                            sedation options and risks were discussed with the                            patient. All questions were answered and informed                            consent was obtained.                           - ASA Grade Assessment: II - A patient with mild                            systemic disease.                           After obtaining informed consent, the colonoscope                            was passed under direct vision. Throughout the  procedure, the patient's blood pressure, pulse, and                            oxygen saturations were monitored continuously. The                            PCF-H190DL ([id number]) scope was introduced through                            the anus and advanced to the the cecum, identified                            by appendiceal orifice and ileocecal valve. The                             colonoscopy was performed without difficulty. The                            patient tolerated the procedure well. The quality                            of the bowel preparation was adequate. Scope                            withdrawal time was 15 minutes. Scope In: 8:30:32 AM Scope Out: 8:49:43 AM Scope Withdrawal Time: 0 hours 15 minutes 25 seconds  Total Procedure Duration: 0 hours 19 minutes 11 seconds  Findings:      The perianal and digital rectal examinations were normal.      Multiple small and large-mouthed diverticula were found in the sigmoid       colon and descending colon.      Non-bleeding internal hemorrhoids were found during retroflexion. The       hemorrhoids were small. Impression:               - Diverticulosis in the sigmoid colon and in the                            descending colon. There was a small piece of                            alluminum foil in the lumen.                           - Non-bleeding internal hemorrhoids.                           - No specimens collected. Moderate Sedation:      Per Anesthesia Care Recommendation:           - Discharge patient to home (ambulatory).                           - Resume previous diet.                           -  Repeat colonoscopy in 10 years for screening                            purposes. Procedure Code(s):        --- Professional ---                           J8250, GC, Colorectal cancer screening; colonoscopy                            on individual not meeting criteria for high risk Diagnosis Code(s):        --- Professional ---                           Z12.11, Encounter for screening for malignant                            neoplasm of colon                           K64.8, Other hemorrhoids                           K57.30, Diverticulosis of large intestine without                            perforation or abscess without bleeding CPT copyright 2019 [employer]. All  rights reserved. The codes documented in this report are preliminary and upon coder review may  be revised to meet current compliance requirements. Maylon Peppers, MD Maylon Peppers,  03/14/2020 8:55:39 AM This report has been signed electronically. Number of Addenda: 0

## 2020-03-20 ENCOUNTER — Encounter (HOSPITAL_COMMUNITY): Payer: Self-pay | Admitting: Gastroenterology

## 2020-05-09 ENCOUNTER — Other Ambulatory Visit: Payer: BC Managed Care – PPO

## 2020-05-09 DIAGNOSIS — Z20822 Contact with and (suspected) exposure to covid-19: Secondary | ICD-10-CM

## 2020-05-10 LAB — NOVEL CORONAVIRUS, NAA: SARS-CoV-2, NAA: NOT DETECTED

## 2020-05-10 LAB — SARS-COV-2, NAA 2 DAY TAT

## 2020-06-06 IMAGING — MR MR LUMBAR SPINE WO/W CM
6 of 7 series · 30 of 48 positions shown · IV contrast (10ml Gadavist)
Comparison: Lumbar MRI dated 03/15/2011

CLINICAL DATA: Low back pain and right leg pain. Prior surgery at
L5-S1.

EXAM:
MRI LUMBAR SPINE WITHOUT AND WITH CONTRAST
TECHNIQUE: Multiplanar and multiecho pulse sequences of the lumbar spine were
obtained without and with intravenous contrast.
CONTRAST:  10mL GADAVIST GADOBUTROL 1 MMOL/ML IV SOLN

[Series 5: T2 · sagittal · 4.0mm · 0.81mm/px · 4 of 17 slices shown (1 of 2)]
[im 1/17]
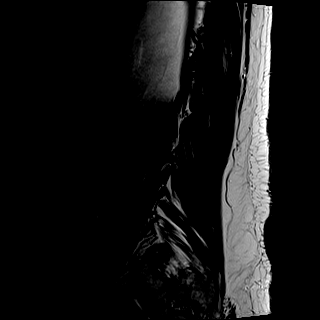
[im 6/17]
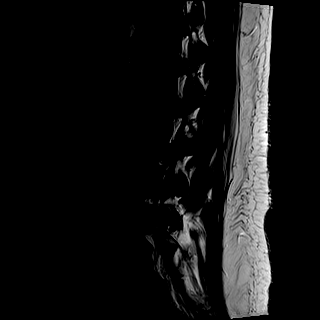
[im 11/17]
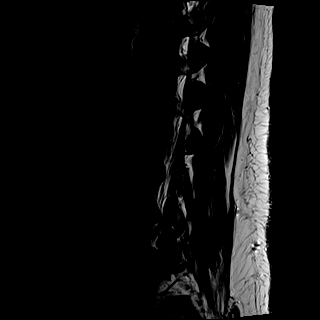
[im 17/17]
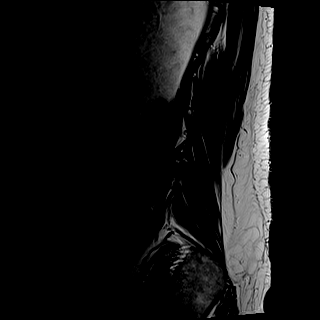

[Series 6: T1 · sagittal · 4.0mm · 0.81mm/px · 4 of 17 slices shown (1 of 2)]
[im 1/17]
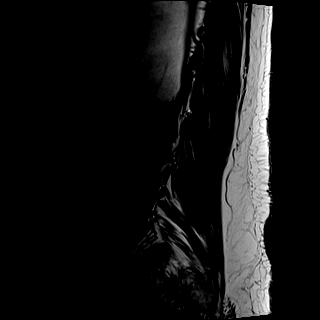
[im 6/17]
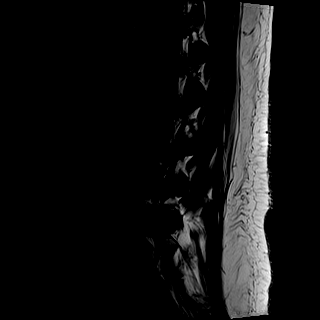
[im 11/17]
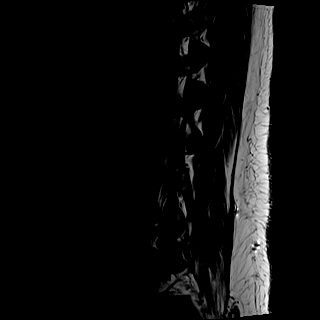
[im 17/17]
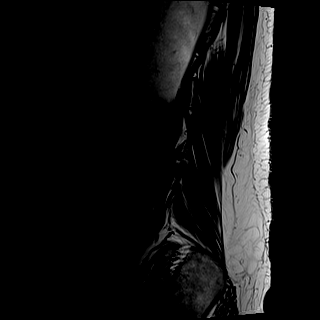

[Series 7: STIR · sagittal · 4.0mm · 0.41mm/px · 1 of 17 slices shown]
[im 1/17]
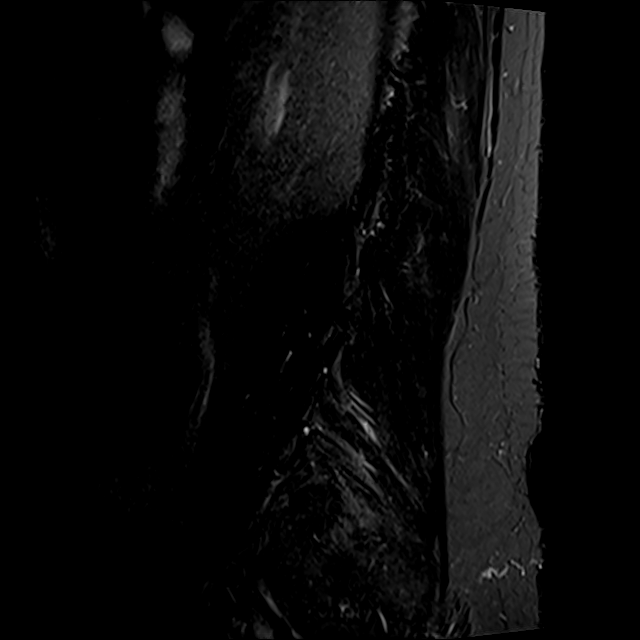

[Series 8: T2 · axial · 4.0mm · 0.78mm/px · z∈[-111,+88]mm · 8 of 37 slices shown (2 of 2)]
[im 1/37]
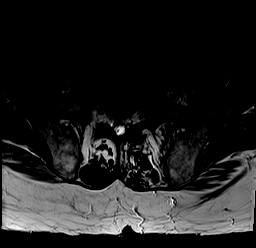
[im 5/37]
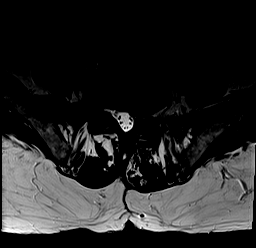
[im 13/37]
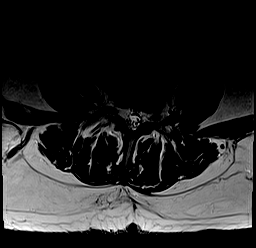
[im 17/37]
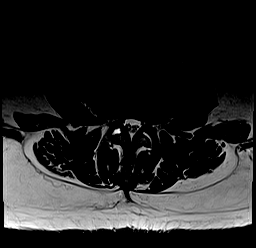
[im 21/37]
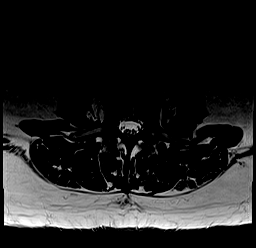
[im 25/37]
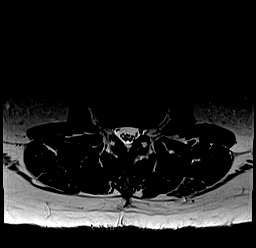
[im 33/37]
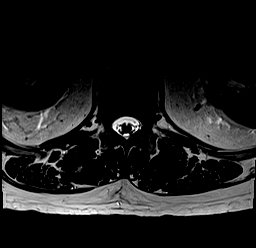
[im 37/37]
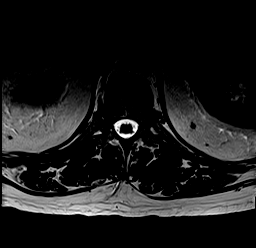

[Series 10: T1 · axial · 4.0mm · 0.39mm/px · z∈[-111,+88]mm · 8 of 37 slices shown (2 of 2)]
[im 1/37]
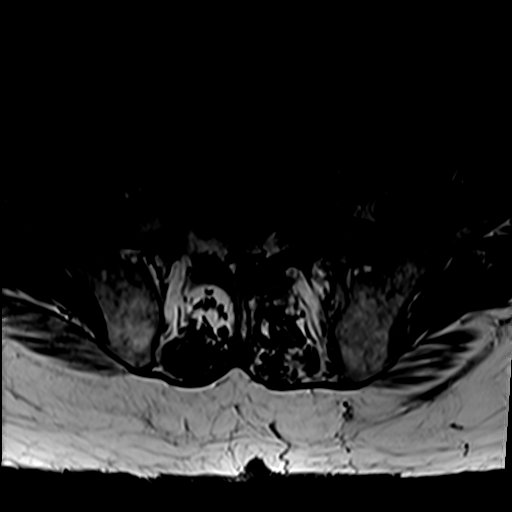
[im 5/37]
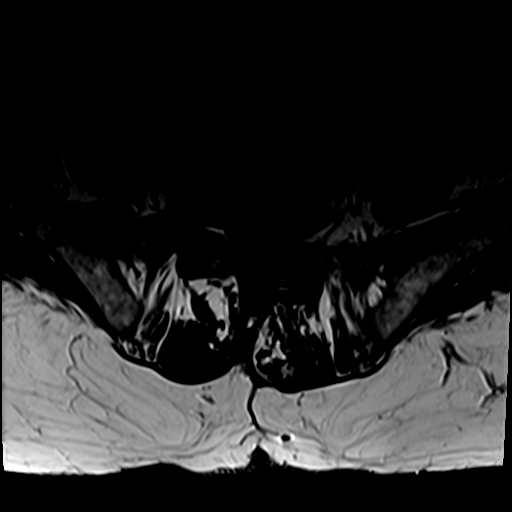
[im 13/37]
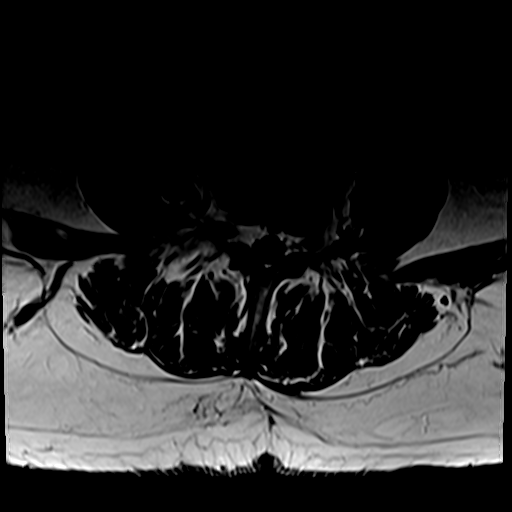
[im 17/37]
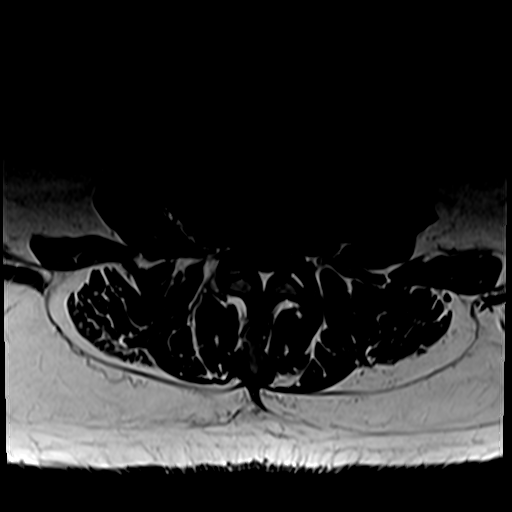
[im 21/37]
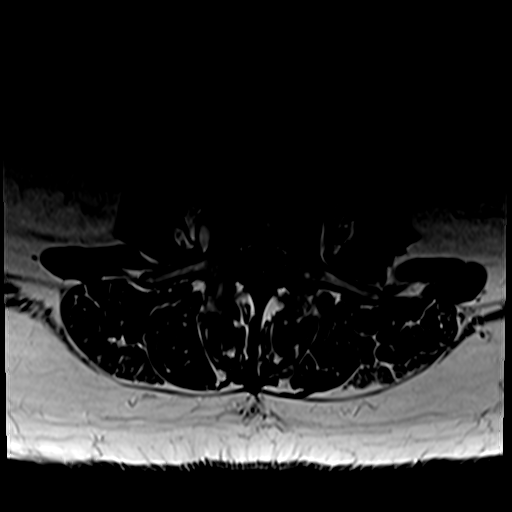
[im 25/37]
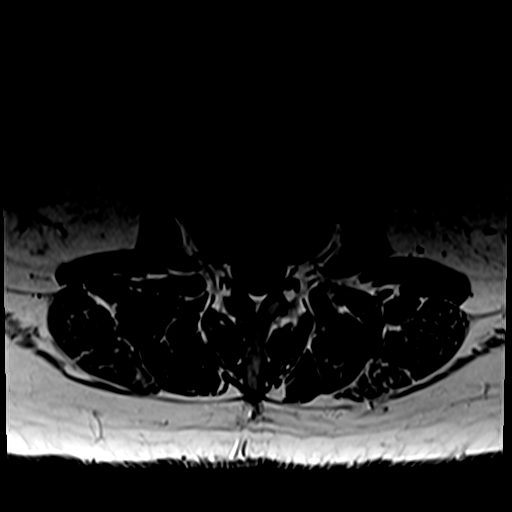
[im 33/37]
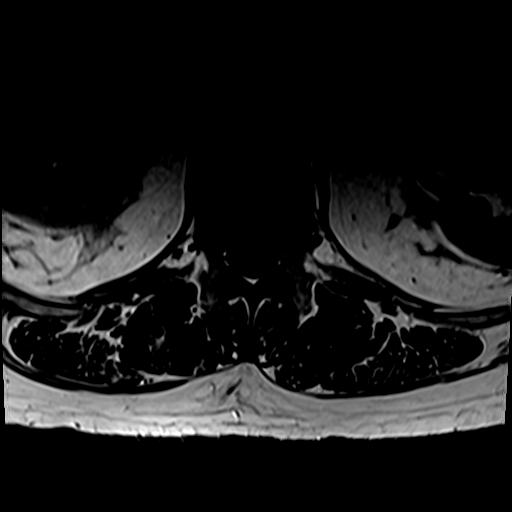
[im 37/37]
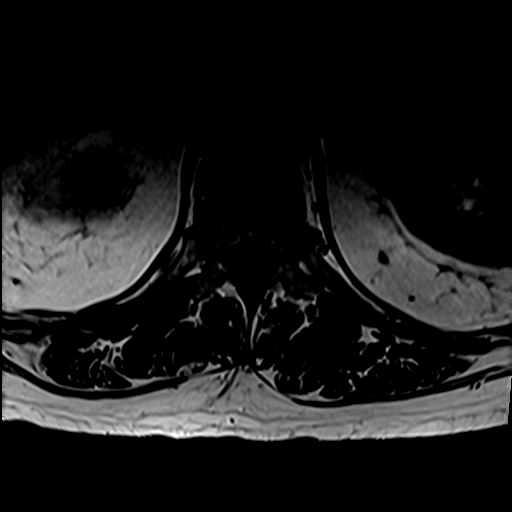

[Series 11: T1 fat-sat post-contrast · sagittal · 4.0mm · 0.81mm/px · 5 of 17 slices shown]
[im 1/17]
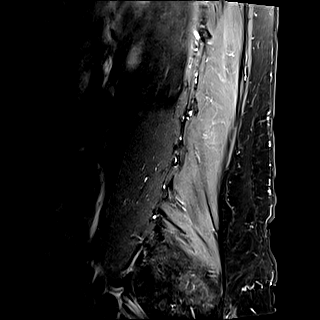
[im 5/17]
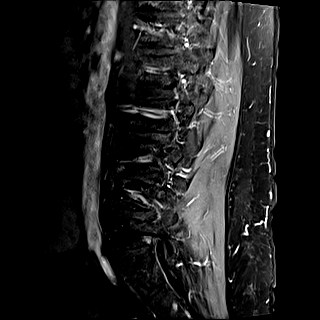
[im 9/17]
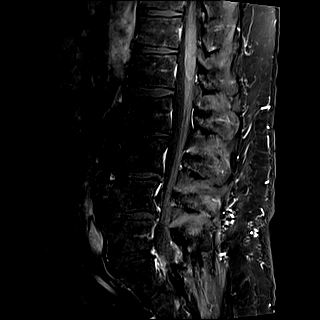
[im 13/17]
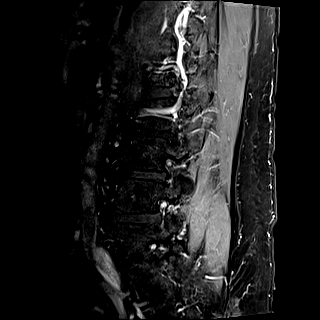
[im 17/17]
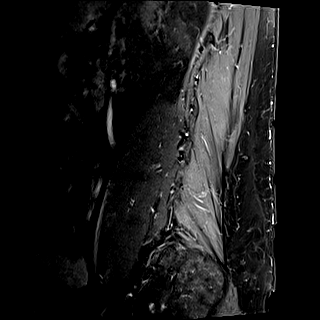

[30 of 48 positions shown; findings below may reference images not displayed]

FINDINGS: Segmentation:  Standard.

Alignment:  2 mm retrolisthesis of L4 on L5.

Vertebrae: There small lesions in the iliac bones just lateral to
the SI joints, 9 mm on the right and 10 mm on the left. Minimal
enhancement after contrast administration. No other significant
abnormalities.

Conus medullaris and cauda equina: Conus extends to the L1-2 level.
Conus and cauda equina appear normal.

Paraspinal and other soft tissues: Negative.

Disc levels:

T12-L1: No significant abnormality.

L1-2: Normal.

L2-3: Chronic disc space narrowing with disc desiccation. Tiny disc
bulge to the left of midline with no neural impingement, unchanged.

L3-4: Chronic disc space narrowing and disc desiccation. Small disc
bulge into the left neural foramen, unchanged. Small disc protrusion
into the right neural foramen, diminished in size since the prior
study. Small bilateral joint effusions.

L4-5: Broad-based disc bulge with a chronic disc protrusion in and
lateral to the right neural foramen. The bulging protrusion have
increased since the prior study. There is increased compression of
the thecal sac and of the right lateral recess but the right L4
nerve appears to exit without impingement. The right L5 nerve could
be affected in the lateral recess on image 28 of series 8.

L5-S1: Chronic disc space narrowing. Small endplate osteophytes
extend into the left lateral recess and left neural foramen without
focal neural impingement. Postsurgical changes are stable.
IMPRESSION: 1. Increased soft disc protrusion at L4-5 in and lateral to the
right neural foramen with increased compression of the thecal sac
and right lateral recess. The right L4 nerve could be affected in
the lateral recess.
2. Chronic degenerative disc disease at L2-3 through L5-S1 without
focal neural impingement.
3. Nonspecific small lesions in the iliac bones with minimal
enhancement after contrast administration. The area of these lesions
was not included on the prior lumbar MRI. Does the patient have any
history of malignancy or hematological disorder such as myeloma?

## 2021-01-23 ENCOUNTER — Other Ambulatory Visit: Payer: Self-pay | Admitting: Physician Assistant

## 2021-02-14 NOTE — Progress Notes (Signed)
Sent message, via epic in basket, requesting orders in epic from surgeon.  

## 2021-02-21 ENCOUNTER — Ambulatory Visit: Payer: Self-pay | Admitting: Physician Assistant

## 2021-02-21 DIAGNOSIS — M1711 Unilateral primary osteoarthritis, right knee: Secondary | ICD-10-CM

## 2021-02-21 DIAGNOSIS — Z8614 Personal history of Methicillin resistant Staphylococcus aureus infection: Secondary | ICD-10-CM

## 2021-02-21 NOTE — H&P (View-Only) (Signed)
TOTAL KNEE ADMISSION H&P  Patient is being admitted for right total knee arthroplasty.  Subjective:  Chief Complaint:right knee pain.  HPI: Riley Larson, 50 y.o. male, has a history of pain and functional disability in the right knee due to arthritis and has failed non-surgical conservative treatments for greater than 12 weeks to includeNSAID's and/or analgesics, corticosteriod injections, use of assistive devices, and activity modification.  Onset of symptoms was gradual, starting 8 years ago with gradually worsening course since that time. The patient noted no past surgery on the right knee(s).  Patient currently rates pain in the right knee(s) at 8 out of 10 with activity. Patient has night pain, worsening of pain with activity and weight bearing, pain that interferes with activities of daily living, pain with passive range of motion, crepitus, and joint swelling.  Patient has evidence of periarticular osteophytes and joint space narrowing by imaging studies. There is no active infection.  Patient Active Problem List   Diagnosis Date Noted   CELLULITIS, LEFT HAND 10/16/2009   Past Medical History:  Diagnosis Date   Hypertension     Past Surgical History:  Procedure Laterality Date   BACK SURGERY     COLONOSCOPY WITH PROPOFOL N/A 03/14/2020   Procedure: COLONOSCOPY WITH PROPOFOL;  Surgeon: Harvel Quale, MD;  Location: [facility];  Service: Gastroenterology;  Laterality: N/A;  9:15   LAPAROSCOPIC GASTRIC BANDING      Current Outpatient Medications  Medication Sig Dispense Refill Last Dose   allopurinol (ZYLOPRIM) 300 MG tablet Take 300 mg by mouth daily.      colchicine 0.6 MG tablet Take 0.6 mg by mouth 2 (two) times daily as needed (gout).       lisinopril-hydrochlorothiazide (ZESTORETIC) 20-12.5 MG tablet Take 1 tablet by mouth daily.       Multiple Vitamins-Minerals (MULTIVITAMIN WITH MINERALS) tablet Take 1 tablet by mouth daily. [employer] multi pack      sildenafil  (REVATIO) 20 MG tablet Take 60 mg by mouth daily as needed (ED).       No current facility-administered medications for this visit.   No Known Allergies  Social History   Tobacco Use   Smoking status: Never   Smokeless tobacco: Never  Substance Use Topics   Alcohol use: Not on file    No family history on file.   Review of Systems  Musculoskeletal:  Positive for arthralgias.  All other systems reviewed and are negative.  Objective:  Physical Exam Constitutional:      General: He is not in acute distress.    Appearance: Normal appearance.  HENT:     Head: Normocephalic and atraumatic.  Eyes:     Extraocular Movements: Extraocular movements intact.     Pupils: Pupils are equal, round, and reactive to light.  Cardiovascular:     Rate and Rhythm: Normal rate and regular rhythm.     Pulses: Normal pulses.     Heart sounds: Normal heart sounds.  Pulmonary:     Effort: Pulmonary effort is normal. No respiratory distress.     Breath sounds: Normal breath sounds.  Abdominal:     General: Abdomen is flat. Bowel sounds are normal. There is no distension.     Palpations: Abdomen is soft.     Tenderness: There is no abdominal tenderness.  Musculoskeletal:     Cervical back: Normal range of motion and neck supple.     Comments: Examination of the right lower extremity again shows he is neurovascularly intact.  Intact dorsiflexion and plantarflexion with the ankle.  There is no calf tenderness to palpation.  He does have joint line tenderness with the knees.  Valgus deformity, at least 15 degrees, which is correctable.    Lymphadenopathy:     Cervical: No cervical adenopathy.  Skin:    General: Skin is warm and dry.     Findings: No erythema or rash.  Neurological:     General: No focal deficit present.     Mental Status: He is alert and oriented to person, place, and time.  Psychiatric:        Mood and Affect: Mood normal.        Behavior: Behavior normal.    Vital signs in  last 24 hours: @VSRANGES @  Labs:   Estimated body mass index is 31.14 kg/m as calculated from the following:   Height as of 03/14/20: 5\' 10"  (1.778 m).   Weight as of 03/14/20: 98.4 kg.   Imaging Review Plain radiographs demonstrate moderate degenerative joint disease of the right knee(s). The overall alignment issignificant valgus. The bone quality appears to be good for age and reported activity level.      Assessment/Plan:  End stage arthritis, right knee   The patient history, physical examination, clinical judgment of the provider and imaging studies are consistent with end stage degenerative joint disease of the right knee(s) and total knee arthroplasty is deemed medically necessary. The treatment options including medical management, injection therapy arthroscopy and arthroplasty were discussed at length. The risks and benefits of total knee arthroplasty were presented and reviewed. The risks due to aseptic loosening, infection, stiffness, patella tracking problems, thromboembolic complications and other imponderables were discussed. The patient acknowledged the explanation, agreed to proceed with the plan and consent was signed. Patient is being admitted for inpatient treatment for surgery, pain control, PT, OT, prophylactic antibiotics, VTE prophylaxis, progressive ambulation and ADL's and discharge planning. The patient is planning to be discharged  home with outpt PT.    Anticipated LOS equal to or greater than 2 midnights due to - Age 45 and older with one or more of the following:  - Obesity  - Expected need for hospital services (PT, OT, Nursing) required for safe  discharge  - Anticipated need for postoperative skilled nursing care or inpatient rehab  - Active co-morbidities: None OR   - Unanticipated findings during/Post Surgery: None  - Patient is a high risk of re-admission due to: None

## 2021-02-21 NOTE — H&P (Signed)
TOTAL KNEE ADMISSION H&P  Patient is being admitted for right total knee arthroplasty.  Subjective:  Chief Complaint:right knee pain.  HPI: Riley Larson, 50 y.o. male, has a history of pain and functional disability in the right knee due to arthritis and has failed non-surgical conservative treatments for greater than 12 weeks to includeNSAID's and/or analgesics, corticosteriod injections, use of assistive devices, and activity modification.  Onset of symptoms was gradual, starting 8 years ago with gradually worsening course since that time. The patient noted no past surgery on the right knee(s).  Patient currently rates pain in the right knee(s) at 8 out of 10 with activity. Patient has night pain, worsening of pain with activity and weight bearing, pain that interferes with activities of daily living, pain with passive range of motion, crepitus, and joint swelling.  Patient has evidence of periarticular osteophytes and joint space narrowing by imaging studies. There is no active infection.  Patient Active Problem List   Diagnosis Date Noted   CELLULITIS, LEFT HAND 10/16/2009   Past Medical History:  Diagnosis Date   Hypertension     Past Surgical History:  Procedure Laterality Date   BACK SURGERY     COLONOSCOPY WITH PROPOFOL N/A 03/14/2020   Procedure: COLONOSCOPY WITH PROPOFOL;  Surgeon: Harvel Quale, MD;  Location: [facility];  Service: Gastroenterology;  Laterality: N/A;  9:15   LAPAROSCOPIC GASTRIC BANDING      Current Outpatient Medications  Medication Sig Dispense Refill Last Dose   allopurinol (ZYLOPRIM) 300 MG tablet Take 300 mg by mouth daily.      colchicine 0.6 MG tablet Take 0.6 mg by mouth 2 (two) times daily as needed (gout).       lisinopril-hydrochlorothiazide (ZESTORETIC) 20-12.5 MG tablet Take 1 tablet by mouth daily.       Multiple Vitamins-Minerals (MULTIVITAMIN WITH MINERALS) tablet Take 1 tablet by mouth daily. [employer] multi pack      sildenafil  (REVATIO) 20 MG tablet Take 60 mg by mouth daily as needed (ED).       No current facility-administered medications for this visit.   No Known Allergies  Social History   Tobacco Use   Smoking status: Never   Smokeless tobacco: Never  Substance Use Topics   Alcohol use: Not on file    No family history on file.   Review of Systems  Musculoskeletal:  Positive for arthralgias.  All other systems reviewed and are negative.  Objective:  Physical Exam Constitutional:      General: He is not in acute distress.    Appearance: Normal appearance.  HENT:     Head: Normocephalic and atraumatic.  Eyes:     Extraocular Movements: Extraocular movements intact.     Pupils: Pupils are equal, round, and reactive to light.  Cardiovascular:     Rate and Rhythm: Normal rate and regular rhythm.     Pulses: Normal pulses.     Heart sounds: Normal heart sounds.  Pulmonary:     Effort: Pulmonary effort is normal. No respiratory distress.     Breath sounds: Normal breath sounds.  Abdominal:     General: Abdomen is flat. Bowel sounds are normal. There is no distension.     Palpations: Abdomen is soft.     Tenderness: There is no abdominal tenderness.  Musculoskeletal:     Cervical back: Normal range of motion and neck supple.     Comments: Examination of the right lower extremity again shows he is neurovascularly intact.  Intact dorsiflexion and plantarflexion with the ankle.  There is no calf tenderness to palpation.  He does have joint line tenderness with the knees.  Valgus deformity, at least 15 degrees, which is correctable.    Lymphadenopathy:     Cervical: No cervical adenopathy.  Skin:    General: Skin is warm and dry.     Findings: No erythema or rash.  Neurological:     General: No focal deficit present.     Mental Status: He is alert and oriented to person, place, and time.  Psychiatric:        Mood and Affect: Mood normal.        Behavior: Behavior normal.    Vital signs in  last 24 hours: @VSRANGES @  Labs:   Estimated body mass index is 31.14 kg/m as calculated from the following:   Height as of 03/14/20: 5\' 10"  (1.778 m).   Weight as of 03/14/20: 98.4 kg.   Imaging Review Plain radiographs demonstrate moderate degenerative joint disease of the right knee(s). The overall alignment issignificant valgus. The bone quality appears to be good for age and reported activity level.      Assessment/Plan:  End stage arthritis, right knee   The patient history, physical examination, clinical judgment of the provider and imaging studies are consistent with end stage degenerative joint disease of the right knee(s) and total knee arthroplasty is deemed medically necessary. The treatment options including medical management, injection therapy arthroscopy and arthroplasty were discussed at length. The risks and benefits of total knee arthroplasty were presented and reviewed. The risks due to aseptic loosening, infection, stiffness, patella tracking problems, thromboembolic complications and other imponderables were discussed. The patient acknowledged the explanation, agreed to proceed with the plan and consent was signed. Patient is being admitted for inpatient treatment for surgery, pain control, PT, OT, prophylactic antibiotics, VTE prophylaxis, progressive ambulation and ADL's and discharge planning. The patient is planning to be discharged  home with outpt PT.    Anticipated LOS equal to or greater than 2 midnights due to - Age 77 and older with one or more of the following:  - Obesity  - Expected need for hospital services (PT, OT, Nursing) required for safe  discharge  - Anticipated need for postoperative skilled nursing care or inpatient rehab  - Active co-morbidities: None OR   - Unanticipated findings during/Post Surgery: None  - Patient is a high risk of re-admission due to: None

## 2021-02-23 NOTE — Progress Notes (Addendum)
Your procedure is scheduled on:      03/09/2021   Report to [facility] Main  Entrance   Report to admitting at  [place] AM     Call this number if you have problems the morning of surgery [phone]    REMEMBER: NO  SOLID FOOD CANDY OR GUM AFTER MIDNIGHT. CLEAR LIQUIDS UNTIL   0430am       . NOTHING BY MOUTH EXCEPT CLEAR LIQUIDS UNTIL  0430am . PLEASE FINISH ENSURE DRINK PER SURGEON ORDER  WHICH NEEDS TO BE COMPLETED AT   0430am    .      CLEAR LIQUID DIET   Foods Allowed                                                                    Coffee and tea, regular and decaf                            Fruit ices (not with fruit pulp)                                      Iced Popsicles                                    Carbonated beverages, regular and diet                                    Cranberry, grape and apple juices Sports drinks like Gatorade Lightly seasoned clear broth or consume(fat free) Sugar, honey syrup ___________________________________________________________________      BRUSH YOUR TEETH MORNING OF SURGERY AND RINSE YOUR MOUTH OUT, NO CHEWING GUM CANDY OR MINTS.     Take these medicines the morning of surgery with A SIP OF WATER:  allopurinol   DO NOT TAKE ANY DIABETIC MEDICATIONS DAY OF YOUR SURGERY                               You may not have any metal on your body including hair pins and              piercings  Do not wear jewelry, make-up, lotions, powders or perfumes, deodorant             Do not wear nail polish on your fingernails.  Do not shave  48 hours prior to surgery.              Men may shave face and neck.   Do not bring valuables to the hospital. [place].  Contacts, dentures or bridgework may not be worn into surgery.  Leave suitcase in the car. After surgery it may be brought to your room.     Patients discharged the day of surgery will not be allowed  to drive  home. IF YOU ARE HAVING SURGERY AND GOING HOME THE SAME DAY, YOU MUST HAVE AN ADULT TO DRIVE YOU HOME AND BE WITH YOU FOR 24 HOURS. YOU MAY GO HOME BY TAXI OR UBER OR ORTHERWISE, BUT AN ADULT MUST ACCOMPANY YOU HOME AND STAY WITH YOU FOR 24 HOURS.  Name and phone number of your driver:  Special Instructions: N/A              Please read over the following fact sheets you were given: _____________________________________________________________________  [facility] - Preparing for Surgery Before surgery, you can play an important role.  Because skin is not sterile, your skin needs to be as free of germs as possible.  You can reduce the number of germs on your skin by washing with CHG (chlorahexidine gluconate) soap before surgery.  CHG is an antiseptic cleaner which kills germs and bonds with the skin to continue killing germs even after washing. Please DO NOT use if you have an allergy to CHG or antibacterial soaps.  If your skin becomes reddened/irritated stop using the CHG and inform your nurse when you arrive at Short Stay. Do not shave (including legs and underarms) for at least 48 hours prior to the first CHG shower.  You may shave your face/neck. Please follow these instructions carefully:  1.  Shower with CHG Soap the night before surgery and the  morning of Surgery.  2.  If you choose to wash your hair, wash your hair first as usual with your  normal  shampoo.  3.  After you shampoo, rinse your hair and body thoroughly to remove the  shampoo.                           4.  Use CHG as you would any other liquid soap.  You can apply chg directly  to the skin and wash                       Gently with a scrungie or clean washcloth.  5.  Apply the CHG Soap to your body ONLY FROM THE NECK DOWN.   Do not use on face/ open                           Wound or open sores. Avoid contact with eyes, ears mouth and genitals (private parts).                       Wash face,  Genitals (private parts) with  your normal soap.             6.  Wash thoroughly, paying special attention to the area where your surgery  will be performed.  7.  Thoroughly rinse your body with warm water from the neck down.  8.  DO NOT shower/wash with your normal soap after using and rinsing off  the CHG Soap.                9.  Pat yourself dry with a clean towel.            10.  Wear clean pajamas.            11.  Place clean sheets on your bed the night of your first shower and do not  sleep with pets. Day of Surgery : Do not apply any lotions/deodorants  the morning of surgery.  Please wear clean clothes to the hospital/surgery center.  FAILURE TO FOLLOW THESE INSTRUCTIONS MAY RESULT IN THE CANCELLATION OF YOUR SURGERY PATIENT SIGNATURE_________________________________  NURSE SIGNATURE__________________________________  ________________________________________________________________________

## 2021-02-23 NOTE — Progress Notes (Incomplete Revision)
Anesthesia Review:  PCP: DR Sharilyn Sites - requested clearance from DR [facility] office. LVMM.  LOV 01/11/2021 on chart Clearance dated 01/29/21 on chart  Cardiologist : Chest x-ray : EKG :02/27/21 Echo : Stress test: Cardiac Cath :  Activity level: can do a flgiht of stairs without difficulty  Sleep Study/ CPAP : none  Fasting Blood Sugar :      / Checks Blood Sugar -- times a day:   Blood Thinner/ Instructions /Last Dose: ASA / Instructions/ Last Dose :   No covid test ambulatory surgery

## 2021-02-23 NOTE — Progress Notes (Addendum)
Anesthesia Review:  PCP: DR Sharilyn Sites - requested clearance from DR [facility] office. LVMM.   Cardiologist : Chest x-ray : EKG :02/27/21 Echo : Stress test: Cardiac Cath :  Activity level: can do a flgiht of stairs without difficulty  Sleep Study/ CPAP : none  Fasting Blood Sugar :      / Checks Blood Sugar -- times a day:   Blood Thinner/ Instructions /Last Dose: ASA / Instructions/ Last Dose :   No covid test ambulatory surgery

## 2021-02-27 ENCOUNTER — Other Ambulatory Visit: Payer: Self-pay

## 2021-02-27 ENCOUNTER — Encounter (HOSPITAL_COMMUNITY)
Admission: RE | Admit: 2021-02-27 | Discharge: 2021-02-27 | Disposition: A | Discharge status: Home / Self Care | Payer: BC Managed Care – PPO | Source: Ambulatory Visit | Attending: Orthopedic Surgery | Admitting: Orthopedic Surgery

## 2021-02-27 ENCOUNTER — Encounter (HOSPITAL_COMMUNITY): Payer: Self-pay

## 2021-02-27 VITALS — BP 168/85 | HR 87 | Temp 98.2°F | Resp 16 | Ht 69.0 in | Wt 225.0 lb

## 2021-02-27 DIAGNOSIS — Z01818 Encounter for other preprocedural examination: Secondary | ICD-10-CM | POA: Insufficient documentation

## 2021-02-27 DIAGNOSIS — M1711 Unilateral primary osteoarthritis, right knee: Secondary | ICD-10-CM

## 2021-02-27 DIAGNOSIS — Z8614 Personal history of Methicillin resistant Staphylococcus aureus infection: Secondary | ICD-10-CM

## 2021-02-27 HISTORY — DX: Unspecified osteoarthritis, unspecified site: M19.90

## 2021-02-27 LAB — COMPREHENSIVE METABOLIC PANEL
ALT: 29 U/L (ref 0–44)
AST: 30 U/L (ref 15–41)
Albumin: 5 g/dL (ref 3.5–5.0)
Alkaline Phosphatase: 52 U/L (ref 38–126)
Anion gap: 11 (ref 5–15)
BUN: 9 mg/dL (ref 6–20)
CO2: 28 mmol/L (ref 22–32)
Calcium: 9.3 mg/dL (ref 8.9–10.3)
Chloride: 95 mmol/L — ABNORMAL LOW (ref 98–111)
Creatinine, Ser: 0.66 mg/dL (ref 0.61–1.24)
GFR, Estimated: >60 mL/min (ref >60)
Glucose, Bld: 103 mg/dL — ABNORMAL HIGH (ref 70–99)
Potassium: 4.6 mmol/L (ref 3.5–5.1)
Sodium: 134 mmol/L — ABNORMAL LOW (ref 135–145)
Total Bilirubin: 0.9 mg/dL (ref 0.3–1.2)
Total Protein: 8.1 g/dL (ref 6.5–8.1)

## 2021-02-27 LAB — CBC WITH DIFFERENTIAL/PLATELET
Abs Immature Granulocytes: 0.02 10*3/uL (ref 0.00–0.07)
Basophils Absolute: 0.1 10*3/uL (ref 0.0–0.1)
Basophils Relative: 1 %
Eosinophils Absolute: 0.1 10*3/uL (ref 0.0–0.5)
Eosinophils Relative: 1 %
HCT: 47.5 % (ref 39.0–52.0)
Hemoglobin: 16.3 g/dL (ref 13.0–17.0)
Immature Granulocytes: 0 %
Lymphocytes Relative: 19 %
Lymphs Abs: 1.2 10*3/uL (ref 0.7–4.0)
MCH: 33.3 pg (ref 26.0–34.0)
MCHC: 34.3 g/dL (ref 30.0–36.0)
MCV: 97.1 fL (ref 80.0–100.0)
Monocytes Absolute: 0.9 10*3/uL (ref 0.1–1.0)
Monocytes Relative: 15 %
Neutro Abs: 3.8 10*3/uL (ref 1.7–7.7)
Neutrophils Relative %: 64 %
Platelets: 257 10*3/uL (ref 150–400)
RBC: 4.89 MIL/uL (ref 4.22–5.81)
RDW: 12.6 % (ref 11.5–15.5)
WBC: 6 10*3/uL (ref 4.0–10.5)
nRBC: 0 % (ref 0.0–0.2)

## 2021-02-27 LAB — SURGICAL PCR SCREEN
MRSA, PCR: NEGATIVE
Staphylococcus aureus: NEGATIVE

## 2021-03-08 NOTE — Anesthesia Preprocedure Evaluation (Addendum)
Anesthesia Evaluation  Patient identified by MRN, date of birth, ID band Patient awake    Reviewed: Allergy & Precautions, NPO status , Patient's Chart, lab work & pertinent test results  Airway Mallampati: II  TM Distance: >3 FB Neck ROM: Full    Dental no notable dental hx. (+) Caps, Teeth Intact   Pulmonary neg pulmonary ROS,    Pulmonary exam normal breath sounds clear to auscultation       Cardiovascular hypertension, Normal cardiovascular exam Rhythm:Regular Rate:Normal     Neuro/Psych negative neurological ROS  negative psych ROS   GI/Hepatic negative GI ROS, Neg liver ROS,   Endo/Other  negative endocrine ROS  Renal/GU Lab Results      Component                Value               Date                      CREATININE               0.66                02/27/2021                BUN                      9                   02/27/2021                NA                       134 (L)             02/27/2021                K                        4.6                 02/27/2021                CL                       95 (L)              02/27/2021                CO2                      28                  02/27/2021                Musculoskeletal  (+) Arthritis , Osteoarthritis,    Abdominal (+) + obese (BMI 33.21),   Peds  Hematology Lab Results      Component                Value               Date                      WBC  6.0                 02/27/2021                HGB                      16.3                02/27/2021                HCT                      47.5                02/27/2021                MCV                      97.1                02/27/2021                PLT                      257                 02/27/2021              Anesthesia Other Findings   Reproductive/Obstetrics                            Anesthesia Physical Anesthesia  Plan  ASA: 3  Anesthesia Plan: Spinal and Regional   Post-op Pain Management: Regional block and Minimal or no pain anticipated   Induction:   PONV Risk Score and Plan: 1 and Treatment may vary due to age or medical condition  Airway Management Planned: Natural Airway and Simple Face Mask  Additional Equipment: None  Intra-op Plan:   Post-operative Plan:   Informed Consent: I have reviewed the patients History and Physical, chart, labs and discussed the procedure including the risks, benefits and alternatives for the proposed anesthesia with the patient or authorized representative who has indicated his/her understanding and acceptance.     Dental advisory given  Plan Discussed with:   Anesthesia Plan Comments: (R TKA under Spinal w R adductor canal block)       Anesthesia Quick Evaluation

## 2021-03-09 ENCOUNTER — Encounter (HOSPITAL_COMMUNITY)
Admission: RE | Disposition: A | Discharge status: Home / Self Care | Payer: Self-pay | Source: Home / Self Care | Attending: Orthopedic Surgery

## 2021-03-09 ENCOUNTER — Ambulatory Visit (HOSPITAL_COMMUNITY)
Admission: RE | Admit: 2021-03-09 | Discharge: 2021-03-09 | Disposition: A | Discharge status: Home / Self Care | Payer: BC Managed Care – PPO | Attending: Orthopedic Surgery | Admitting: Orthopedic Surgery

## 2021-03-09 ENCOUNTER — Ambulatory Visit (HOSPITAL_COMMUNITY): Payer: BC Managed Care – PPO | Admitting: Anesthesiology

## 2021-03-09 ENCOUNTER — Ambulatory Visit (HOSPITAL_COMMUNITY): Payer: BC Managed Care – PPO | Admitting: Physician Assistant

## 2021-03-09 ENCOUNTER — Encounter (HOSPITAL_COMMUNITY): Payer: Self-pay | Admitting: Orthopedic Surgery

## 2021-03-09 DIAGNOSIS — M179 Osteoarthritis of knee, unspecified: Secondary | ICD-10-CM | POA: Diagnosis not present

## 2021-03-09 DIAGNOSIS — E669 Obesity, unspecified: Secondary | ICD-10-CM | POA: Insufficient documentation

## 2021-03-09 DIAGNOSIS — Z6833 Body mass index (BMI) 33.0-33.9, adult: Secondary | ICD-10-CM | POA: Diagnosis not present

## 2021-03-09 DIAGNOSIS — M21061 Valgus deformity, not elsewhere classified, right knee: Secondary | ICD-10-CM | POA: Insufficient documentation

## 2021-03-09 DIAGNOSIS — I10 Essential (primary) hypertension: Secondary | ICD-10-CM | POA: Insufficient documentation

## 2021-03-09 HISTORY — PX: TOTAL KNEE ARTHROPLASTY: SHX125

## 2021-03-09 LAB — TYPE AND SCREEN
ABO/RH(D): O POS
Antibody Screen: NEGATIVE

## 2021-03-09 LAB — ABO/RH: ABO/RH(D): O POS

## 2021-03-09 SURGERY — ARTHROPLASTY, KNEE, TOTAL
Anesthesia: Regional | Site: Knee | Laterality: Right

## 2021-03-09 MED ORDER — PROPOFOL 10 MG/ML IV BOLUS
INTRAVENOUS | Status: AC
  Filled 2021-03-09: qty 20

## 2021-03-09 MED ORDER — BUPIVACAINE LIPOSOME 1.3 % IJ SUSP
INTRAMUSCULAR | Status: AC
  Filled 2021-03-09: qty 20

## 2021-03-09 MED ORDER — OXYCODONE HCL 5 MG PO TABS: ORAL_TABLET | ORAL | 0 refills | Status: DC

## 2021-03-09 MED ORDER — LACTATED RINGERS IV BOLUS
250.0000 mL | Freq: Once | INTRAVENOUS | Status: AC
  Administered 2021-03-09: 250 mL via INTRAVENOUS

## 2021-03-09 MED ORDER — OXYCODONE HCL 5 MG PO TABS
ORAL_TABLET | ORAL | Status: AC
  Administered 2021-03-09: 5 mg via ORAL
  Filled 2021-03-09: qty 1

## 2021-03-09 MED ORDER — HYDROMORPHONE HCL 1 MG/ML IJ SOLN: 0.2500 mg | INTRAMUSCULAR | Status: DC | PRN

## 2021-03-09 MED ORDER — CHLORHEXIDINE GLUCONATE 0.12 % MT SOLN
15.0000 mL | Freq: Once | OROMUCOSAL | Status: AC
  Administered 2021-03-09: 15 mL via OROMUCOSAL

## 2021-03-09 MED ORDER — BUPIVACAINE IN DEXTROSE 0.75-8.25 % IT SOLN
INTRATHECAL | Status: DC | PRN
  Administered 2021-03-09: 12 mg via INTRATHECAL

## 2021-03-09 MED ORDER — PROPOFOL 500 MG/50ML IV EMUL
INTRAVENOUS | Status: DC | PRN
  Administered 2021-03-09: 50 ug/kg/min via INTRAVENOUS

## 2021-03-09 MED ORDER — OXYCODONE HCL 5 MG/5ML PO SOLN: 5.0000 mg | Freq: Once | ORAL | Status: AC | PRN

## 2021-03-09 MED ORDER — DEXAMETHASONE SODIUM PHOSPHATE 10 MG/ML IJ SOLN
INTRAMUSCULAR | Status: DC | PRN
  Administered 2021-03-09: 10 mg via INTRAVENOUS

## 2021-03-09 MED ORDER — ACETAMINOPHEN 500 MG PO TABS
1000.0000 mg | ORAL_TABLET | Freq: Once | ORAL | Status: AC
  Administered 2021-03-09: 1000 mg via ORAL
  Filled 2021-03-09: qty 2

## 2021-03-09 MED ORDER — ONDANSETRON HCL 4 MG/2ML IJ SOLN
INTRAMUSCULAR | Status: DC | PRN
  Administered 2021-03-09: 4 mg via INTRAVENOUS

## 2021-03-09 MED ORDER — DOCUSATE SODIUM 100 MG PO CAPS: 100.0000 mg | ORAL_CAPSULE | Freq: Every day | ORAL | 2 refills | Status: AC | PRN

## 2021-03-09 MED ORDER — LIDOCAINE HCL (PF) 2 % IJ SOLN
INTRAMUSCULAR | Status: AC
  Filled 2021-03-09: qty 5

## 2021-03-09 MED ORDER — PROPOFOL 10 MG/ML IV BOLUS
INTRAVENOUS | Status: DC | PRN
  Administered 2021-03-09 (×2): 20 mg via INTRAVENOUS
  Administered 2021-03-09: 10 mg via INTRAVENOUS

## 2021-03-09 MED ORDER — PROPOFOL 500 MG/50ML IV EMUL
INTRAVENOUS | Status: AC
  Filled 2021-03-09: qty 50

## 2021-03-09 MED ORDER — BUPIVACAINE-EPINEPHRINE (PF) 0.25% -1:200000 IJ SOLN
INTRAMUSCULAR | Status: DC | PRN
  Administered 2021-03-09: 30 mL

## 2021-03-09 MED ORDER — OXYCODONE HCL 5 MG PO TABS: 5.0000 mg | ORAL_TABLET | Freq: Once | ORAL | Status: AC | PRN

## 2021-03-09 MED ORDER — METHOCARBAMOL 500 MG PO TABS: 500.0000 mg | ORAL_TABLET | Freq: Four times a day (QID) | ORAL | 0 refills | Status: DC | PRN

## 2021-03-09 MED ORDER — DEXAMETHASONE SODIUM PHOSPHATE 10 MG/ML IJ SOLN
INTRAMUSCULAR | Status: AC
  Filled 2021-03-09: qty 1

## 2021-03-09 MED ORDER — SODIUM CHLORIDE (PF) 0.9 % IJ SOLN
INTRAMUSCULAR | Status: AC
  Filled 2021-03-09: qty 10

## 2021-03-09 MED ORDER — FENTANYL CITRATE (PF) 100 MCG/2ML IJ SOLN
INTRAMUSCULAR | Status: AC
  Filled 2021-03-09: qty 2

## 2021-03-09 MED ORDER — TRANEXAMIC ACID-NACL 1000-0.7 MG/100ML-% IV SOLN
1000.0000 mg | INTRAVENOUS | Status: AC
  Administered 2021-03-09: 1000 mg via INTRAVENOUS
  Filled 2021-03-09: qty 100

## 2021-03-09 MED ORDER — METHOCARBAMOL 500 MG IVPB - SIMPLE MED
INTRAVENOUS | Status: AC
  Administered 2021-03-09: 500 mg via INTRAVENOUS
  Filled 2021-03-09: qty 50

## 2021-03-09 MED ORDER — ONDANSETRON HCL 4 MG/2ML IJ SOLN: 4.0000 mg | Freq: Once | INTRAMUSCULAR | Status: DC | PRN

## 2021-03-09 MED ORDER — TRANEXAMIC ACID 1000 MG/10ML IV SOLN
INTRAVENOUS | Status: DC | PRN
  Administered 2021-03-09: 2000 mg via TOPICAL

## 2021-03-09 MED ORDER — ASPIRIN EC 81 MG PO TBEC: 81.0000 mg | DELAYED_RELEASE_TABLET | Freq: Two times a day (BID) | ORAL | 0 refills | Status: AC

## 2021-03-09 MED ORDER — SODIUM CHLORIDE 0.9 % IR SOLN
Status: DC | PRN
  Administered 2021-03-09: 3000 mL

## 2021-03-09 MED ORDER — MIDAZOLAM HCL 5 MG/5ML IJ SOLN
INTRAMUSCULAR | Status: DC | PRN
  Administered 2021-03-09 (×2): 1 mg via INTRAVENOUS

## 2021-03-09 MED ORDER — ORAL CARE MOUTH RINSE: 15.0000 mL | Freq: Once | OROMUCOSAL | Status: AC

## 2021-03-09 MED ORDER — ONDANSETRON HCL 4 MG/2ML IJ SOLN
INTRAMUSCULAR | Status: AC
  Filled 2021-03-09: qty 2

## 2021-03-09 MED ORDER — PHENYLEPHRINE HCL (PRESSORS) 10 MG/ML IV SOLN
INTRAVENOUS | Status: AC
  Filled 2021-03-09: qty 1

## 2021-03-09 MED ORDER — LACTATED RINGERS IV SOLN
INTRAVENOUS | Status: DC
  Administered 2021-03-09: 1000 mL via INTRAVENOUS

## 2021-03-09 MED ORDER — ACETAMINOPHEN 10 MG/ML IV SOLN: 1000.0000 mg | Freq: Once | INTRAVENOUS | Status: DC | PRN

## 2021-03-09 MED ORDER — METHOCARBAMOL 500 MG IVPB - SIMPLE MED: 500.0000 mg | Freq: Four times a day (QID) | INTRAVENOUS | Status: DC | PRN

## 2021-03-09 MED ORDER — PHENYLEPHRINE HCL-NACL 20-0.9 MG/250ML-% IV SOLN
INTRAVENOUS | Status: DC | PRN
  Administered 2021-03-09: 30 ug/min via INTRAVENOUS

## 2021-03-09 MED ORDER — TRANEXAMIC ACID-NACL 1000-0.7 MG/100ML-% IV SOLN
INTRAVENOUS | Status: AC
  Administered 2021-03-09: 1000 mg via INTRAVENOUS
  Filled 2021-03-09: qty 100

## 2021-03-09 MED ORDER — BUPIVACAINE LIPOSOME 1.3 % IJ SUSP
INTRAMUSCULAR | Status: DC | PRN
  Administered 2021-03-09: 20 mL

## 2021-03-09 MED ORDER — FENTANYL CITRATE (PF) 100 MCG/2ML IJ SOLN
INTRAMUSCULAR | Status: DC | PRN
  Administered 2021-03-09: 100 ug via INTRAVENOUS

## 2021-03-09 MED ORDER — SODIUM CHLORIDE 0.9 % IV SOLN: INTRAVENOUS | Status: DC

## 2021-03-09 MED ORDER — CLONIDINE HCL (ANALGESIA) 100 MCG/ML EP SOLN
EPIDURAL | Status: DC | PRN
  Administered 2021-03-09: 100 ug

## 2021-03-09 MED ORDER — BUPIVACAINE LIPOSOME 1.3 % IJ SUSP: 20.0000 mL | Freq: Once | INTRAMUSCULAR | Status: DC

## 2021-03-09 MED ORDER — ROPIVACAINE HCL 5 MG/ML IJ SOLN
INTRAMUSCULAR | Status: DC | PRN
  Administered 2021-03-09: 30 mL via PERINEURAL

## 2021-03-09 MED ORDER — PROPOFOL 500 MG/50ML IV EMUL: INTRAVENOUS | Status: DC | PRN

## 2021-03-09 MED ORDER — 0.9 % SODIUM CHLORIDE (POUR BTL) OPTIME
TOPICAL | Status: DC | PRN
  Administered 2021-03-09: 1000 mL

## 2021-03-09 MED ORDER — LACTATED RINGERS IV BOLUS
500.0000 mL | Freq: Once | INTRAVENOUS | Status: AC
  Administered 2021-03-09: 500 mL via INTRAVENOUS

## 2021-03-09 MED ORDER — ACETAMINOPHEN 325 MG PO TABS: 650.0000 mg | ORAL_TABLET | ORAL | 2 refills | Status: AC | PRN

## 2021-03-09 MED ORDER — MIDAZOLAM HCL 2 MG/2ML IJ SOLN
INTRAMUSCULAR | Status: AC
  Filled 2021-03-09: qty 2

## 2021-03-09 MED ORDER — AMISULPRIDE (ANTIEMETIC) 5 MG/2ML IV SOLN: 10.0000 mg | Freq: Once | INTRAVENOUS | Status: DC | PRN

## 2021-03-09 MED ORDER — SODIUM CHLORIDE 0.9 % IV SOLN
INTRAVENOUS | Status: DC | PRN
  Administered 2021-03-09: 60 mL via INTRAMUSCULAR

## 2021-03-09 MED ORDER — WATER FOR IRRIGATION, STERILE IR SOLN
Status: DC | PRN
  Administered 2021-03-09: 2000 mL

## 2021-03-09 MED ORDER — TRANEXAMIC ACID 1000 MG/10ML IV SOLN
2000.0000 mg | INTRAVENOUS | Status: DC
  Filled 2021-03-09: qty 20

## 2021-03-09 MED ORDER — TRANEXAMIC ACID-NACL 1000-0.7 MG/100ML-% IV SOLN: 1000.0000 mg | Freq: Once | INTRAVENOUS | Status: AC

## 2021-03-09 MED ORDER — BUPIVACAINE-EPINEPHRINE (PF) 0.25% -1:200000 IJ SOLN
INTRAMUSCULAR | Status: AC
  Filled 2021-03-09: qty 30

## 2021-03-09 MED ORDER — CELECOXIB 200 MG PO CAPS: 200.0000 mg | ORAL_CAPSULE | Freq: Two times a day (BID) | ORAL | 2 refills | Status: DC

## 2021-03-09 MED ORDER — METHOCARBAMOL 500 MG PO TABS: 500.0000 mg | ORAL_TABLET | Freq: Four times a day (QID) | ORAL | Status: DC | PRN

## 2021-03-09 MED ORDER — POVIDONE-IODINE 10 % EX SWAB
2.0000 "application " | Freq: Once | CUTANEOUS | Status: AC
  Administered 2021-03-09: 2 via TOPICAL

## 2021-03-09 MED ORDER — CEFAZOLIN SODIUM-DEXTROSE 2-4 GM/100ML-% IV SOLN
2.0000 g | INTRAVENOUS | Status: AC
  Administered 2021-03-09: 2 g via INTRAVENOUS
  Filled 2021-03-09: qty 100

## 2021-03-09 SURGICAL SUPPLY — 59 items
ATTUNE PS FEM RT SZ 5 CEM KNEE (Femur) ×1 IMPLANT
ATTUNE PSRP INSR SZ5 8 KNEE (Insert) ×1 IMPLANT
BAG COUNTER SPONGE SURGICOUNT (BAG) ×2 IMPLANT
BAG DECANTER FOR FLEXI CONT (MISCELLANEOUS) ×2 IMPLANT
BAG SPEC THK2 15X12 ZIP CLS (MISCELLANEOUS) ×1
BAG SPNG CNTER NS LX DISP (BAG) ×1
BAG ZIPLOCK 12X15 (MISCELLANEOUS) ×2 IMPLANT
BASE TIBIA ATTUNE KNEE SYS SZ6 (Knees) IMPLANT
BLADE SAGITTAL 25.0X1.19X90 (BLADE) ×2 IMPLANT
BLADE SAW SGTL 13X75X1.27 (BLADE) ×2 IMPLANT
BLADE SURG 15 STRL LF DISP TIS (BLADE) ×1 IMPLANT
BLADE SURG 15 STRL SS (BLADE) ×2
BLADE SURG SZ10 CARB STEEL (BLADE) ×4 IMPLANT
BNDG CMPR MED 15X6 ELC VLCR LF (GAUZE/BANDAGES/DRESSINGS) ×1
BNDG ELASTIC 6X15 VLCR STRL LF (GAUZE/BANDAGES/DRESSINGS) ×2 IMPLANT
BONE CEMENT GENTAMICIN (Cement) ×4 IMPLANT
BOWL SMART MIX CTS (DISPOSABLE) ×2 IMPLANT
BSPLAT TIB 6 CMNT ROT PLAT STR (Knees) ×1 IMPLANT
CEMENT BONE GENTAMICIN 40 (Cement) IMPLANT
CLSR STERI-STRIP ANTIMIC 1/2X4 (GAUZE/BANDAGES/DRESSINGS) ×3 IMPLANT
COVER SURGICAL LIGHT HANDLE (MISCELLANEOUS) ×2 IMPLANT
CUFF TOURN SGL QUICK 34 (TOURNIQUET CUFF) ×2
CUFF TRNQT CYL 34X4.125X (TOURNIQUET CUFF) ×1 IMPLANT
DECANTER SPIKE VIAL GLASS SM (MISCELLANEOUS) ×4 IMPLANT
DRAPE INCISE IOBAN 66X45 STRL (DRAPES) ×2 IMPLANT
DRAPE U-SHAPE 47X51 STRL (DRAPES) ×2 IMPLANT
DRESSING AQUACEL AG SP 3.5X10 (GAUZE/BANDAGES/DRESSINGS) ×1 IMPLANT
DRSG AQUACEL AG SP 3.5X10 (GAUZE/BANDAGES/DRESSINGS) ×2
DURAPREP 26ML APPLICATOR (WOUND CARE) ×4 IMPLANT
ELECT REM PT RETURN 15FT ADLT (MISCELLANEOUS) ×2 IMPLANT
GLOVE SRG 8 PF TXTR STRL LF DI (GLOVE) ×2 IMPLANT
GLOVE SURG ORTHO LTX SZ8 (GLOVE) ×2 IMPLANT
GLOVE SURG POLYISO LF SZ7.5 (GLOVE) ×2 IMPLANT
GLOVE SURG UNDER POLY LF SZ8 (GLOVE) ×4
GOWN STRL REUS W/TWL XL LVL3 (GOWN DISPOSABLE) ×8 IMPLANT
HANDPIECE INTERPULSE COAX TIP (DISPOSABLE) ×2
HOLDER FOLEY CATH W/STRAP (MISCELLANEOUS) IMPLANT
HOOD PEEL AWAY FLYTE STAYCOOL (MISCELLANEOUS) ×2 IMPLANT
IMMOBILIZER KNEE 20 (SOFTGOODS) ×2
IMMOBILIZER KNEE 20 THIGH 36 (SOFTGOODS) ×1 IMPLANT
KIT TURNOVER KIT A (KITS) IMPLANT
MANIFOLD NEPTUNE II (INSTRUMENTS) ×2 IMPLANT
NEEDLE HYPO 22GX1.5 SAFETY (NEEDLE) ×4 IMPLANT
NS IRRIG 1000ML POUR BTL (IV SOLUTION) ×2 IMPLANT
PACK TOTAL KNEE CUSTOM (KITS) ×2 IMPLANT
PATELLA MEDIAL ATTUN 35MM KNEE (Knees) ×1 IMPLANT
PROTECTOR NERVE ULNAR (MISCELLANEOUS) ×2 IMPLANT
SET HNDPC FAN SPRY TIP SCT (DISPOSABLE) ×1 IMPLANT
SUT ETHIBOND NAB CT1 #1 30IN (SUTURE) ×4 IMPLANT
SUT MNCRL AB 3-0 PS2 18 (SUTURE) ×2 IMPLANT
SUT VIC AB 0 CT1 36 (SUTURE) ×2 IMPLANT
SUT VIC AB 2-0 CT1 27 (SUTURE) ×4
SUT VIC AB 2-0 CT1 TAPERPNT 27 (SUTURE) ×2 IMPLANT
SYR CONTROL 10ML LL (SYRINGE) ×6 IMPLANT
TIBIA ATTUNE KNEE SYS BASE SZ6 (Knees) ×2 IMPLANT
TOWEL OR 17X26 10 PK STRL BLUE (TOWEL DISPOSABLE) ×2 IMPLANT
TRAY FOLEY MTR SLVR 16FR STAT (SET/KITS/TRAYS/PACK) ×2 IMPLANT
WATER STERILE IRR 1000ML POUR (IV SOLUTION) ×4 IMPLANT
WRAP KNEE MAXI GEL POST OP (GAUZE/BANDAGES/DRESSINGS) ×2 IMPLANT

## 2021-03-09 NOTE — Anesthesia Procedure Notes (Signed)
Anesthesia Regional Block: Adductor canal block   Pre-Anesthetic Checklist: , timeout performed,  Correct Patient, Correct Site, Correct Laterality,  Correct Procedure, Correct Position, site marked,  Risks and benefits discussed,  Surgical consent,  Pre-op evaluation,  At surgeon's request and post-op pain management  Laterality: Lower and Right  Prep: chloraprep       Needles:  Injection technique: Single-shot  Needle Type: Echogenic Needle     Needle Length: 9cm  Needle Gauge: 22     Additional Needles:   Procedures:,,,, ultrasound used (permanent image in chart),,    Narrative:  Start time: 03/09/2021 7:10 AM End time: 03/09/2021 7:16 AM Injection made incrementally with aspirations every 5 mL.  Performed by: Personally  Anesthesiologist: Barnet Glasgow, MD  Additional Notes: Block assessed prior to surgery. Pt tolerated procedure well.

## 2021-03-09 NOTE — Transfer of Care (Signed)
Immediate Anesthesia Transfer of Care Note  Patient: Riley Larson  Procedure(s) Performed: TOTAL KNEE ARTHROPLASTY (Right: Knee)  Patient Location: PACU  Anesthesia Type:Spinal  Level of Consciousness: awake and alert   Airway & Oxygen Therapy: Patient Spontanous Breathing and Patient connected to face mask oxygen  Post-op Assessment: Report given to RN and Post -op Vital signs reviewed and stable  Post vital signs: Reviewed and stable  Last Vitals:  Vitals Value Taken Time  BP 126/92 03/09/21 1007  Temp    Pulse 60 03/09/21 1008  Resp 13 03/09/21 1008  SpO2 100 % 03/09/21 1008  Vitals shown include unvalidated device data.  Last Pain:  Vitals:   03/09/21 0611  TempSrc: Oral  PainSc:       Patients Stated Pain Goal: 3 (71/27/87 1836)  Complications: No notable events documented.

## 2021-03-09 NOTE — Anesthesia Postprocedure Evaluation (Signed)
Anesthesia Post Note  Patient: Riley Larson  Procedure(s) Performed: TOTAL KNEE ARTHROPLASTY (Right: Knee)     Patient location during evaluation: Nursing Unit Anesthesia Type: Regional and Spinal Level of consciousness: oriented and awake and alert Pain management: pain level controlled Vital Signs Assessment: post-procedure vital signs reviewed and stable Respiratory status: spontaneous breathing and respiratory function stable Cardiovascular status: blood pressure returned to baseline and stable Postop Assessment: no headache, no backache, no apparent nausea or vomiting and patient able to bend at knees Anesthetic complications: no   No notable events documented.  Last Vitals:  Vitals:   03/09/21 1130 03/09/21 1200  BP: (!) 149/82 (!) 160/95  Pulse: 60 61  Resp: 16 16  Temp:    SpO2: 97% 100%    Last Pain:  Vitals:   03/09/21 1200  TempSrc:   PainSc: 0-No pain                 Barnet Glasgow

## 2021-03-09 NOTE — Progress Notes (Signed)
Orthopedic Tech Progress Note Patient Details:  Riley Larson [birth year] [id number] Patient will be getting d/c later today so we will hold off on putting him on a CPM.  Ortho Devices Type of Ortho Device: Bone foam zero knee Ortho Device/Splint Location: Right Knee Ortho Device/Splint Interventions: Application   Post Interventions Patient Tolerated: Well  Linus Salmons Avory Mimbs 03/09/2021, 10:47 AM

## 2021-03-09 NOTE — Interval H&P Note (Signed)
History and Physical Interval Note:  03/09/2021 7:37 AM  Riley Larson  has presented today for surgery, with the diagnosis of OA RIGHT KNEE.  The various methods of treatment have been discussed with the patient and family. After consideration of risks, benefits and other options for treatment, the patient has consented to  Procedure(s): TOTAL KNEE ARTHROPLASTY (Right) as a surgical intervention.  The patient's history has been reviewed, patient examined, no change in status, stable for surgery.  I have reviewed the patient's chart and labs.  Questions were answered to the patient's satisfaction.     Yvette Rack

## 2021-03-09 NOTE — Progress Notes (Signed)
Physical Therapy Treatment Patient Details Name: Riley Larson MRN: [id number] DOB: 06-19-1970 Today's Date: 03/09/2021   History of Present Illness Pt s/p R TKR    PT Comments    Pt very cooperative and up to ambulate in hall including negotiating step up and up to bathroom.  On exiting bathroom, pt's R knee noted to be bleeding once again.  RN aware and attending.  Recommendations for follow up therapy are one component of a multi-disciplinary discharge planning process, led by the attending physician.  Recommendations may be updated based on patient status, additional functional criteria and insurance authorization.  Follow Up Recommendations  Outpatient PT     Assistance Recommended at Discharge Intermittent Supervision/Assistance  Equipment Recommendations  None recommended by PT    Recommendations for Other Services       Precautions / Restrictions Precautions Precautions: Knee;Fall Required Braces or Orthoses: Knee Immobilizer - Right Knee Immobilizer - Right: Discontinue once straight leg raise with < 10 degree lag (Pt performed IND SLR this date) Restrictions Weight Bearing Restrictions: No Other Position/Activity Restrictions: WBAT     Mobility  Bed Mobility Overal bed mobility: Modified Independent             General bed mobility comments: Pt to EOB sitting unassisted    Transfers Overall transfer level: Needs assistance Equipment used: Rolling walker (2 wheels) Transfers: Sit to/from Stand Sit to Stand: Min guard;Supervision           General transfer comment: cues for LE management and use of UEs to self assist    Ambulation/Gait Ambulation/Gait assistance: Min guard;Supervision Gait Distance (Feet): 180 Feet Assistive device: Rolling walker (2 wheels) Gait Pattern/deviations: Step-to pattern;Decreased step length - right;Decreased step length - left;Shuffle;Trunk flexed       General Gait Details: cues for seqeunce, posture and  position from RW   Stairs Stairs: Yes Stairs assistance: Min guard Stair Management: No rails;Step to pattern;Backwards;Forwards;With walker Number of Stairs: 2 General stair comments: single step twice (once fwd and once bkwd); cues for sequence and RW/foot placement   Wheelchair Mobility    Modified Rankin (Stroke Patients Only)       Balance                                            Cognition Arousal/Alertness: Awake/alert Behavior During Therapy: WFL for tasks assessed/performed Overall Cognitive Status: Within Functional Limits for tasks assessed                                          Exercises Total Joint Exercises Ankle Circles/Pumps: AROM;Both;15 reps;Supine Quad Sets: AROM;Both;10 reps;Supine Towel Squeeze: AAROM;AROM;Right;10 reps;Supine Heel Slides: AAROM;Right;10 reps;Supine    General Comments        Pertinent Vitals/Pain Pain Assessment: 0-10 Pain Score: 3  Pain Location: R knee Pain Descriptors / Indicators: Sore Pain Intervention(s): Limited activity within patient's tolerance;Monitored during session;Premedicated before session    Home Living Family/patient expects to be discharged to:: Private residence Living Arrangements: Spouse/significant other Available Help at Discharge: Family Type of Home: House Home Access: Stairs to enter   Technical brewer of Steps: 1   Home Layout: One level Home Equipment: Conservation officer, nature (2 wheels);BSC/3in1      Prior Function  PT Goals (current goals can now be found in the care plan section) Acute Rehab PT Goals Patient Stated Goal: Regain IND PT Goal Formulation: With patient Time For Goal Achievement: 03/16/21 Potential to Achieve Goals: Good Progress towards PT goals: Progressing toward goals    Frequency    7X/week      PT Plan Current plan remains appropriate    Co-evaluation              AM-PAC PT "6 Clicks" Mobility    Outcome Measure  Help needed turning from your back to your side while in a flat bed without using bedrails?: None Help needed moving from lying on your back to sitting on the side of a flat bed without using bedrails?: None Help needed moving to and from a bed to a chair (including a wheelchair)?: A Little Help needed standing up from a chair using your arms (e.g., wheelchair or bedside chair)?: A Little Help needed to walk in hospital room?: A Little Help needed climbing 3-5 steps with a railing? : A Little 6 Click Score: 20    End of Session Equipment Utilized During Treatment: Gait belt Activity Tolerance: Patient tolerated treatment well Patient left: in chair;with call bell/phone within reach;with nursing/sitter in room Nurse Communication: Other (comment);Mobility status (bleeding at surgical site) PT Visit Diagnosis: Difficulty in walking, not elsewhere classified (R26.2)     Time: 3149-7026 PT Time Calculation (min) (ACUTE ONLY): 22 min  Charges:  $Gait Training: 8-22 mins                     [place] Pager [phone] Office [phone]    Ruddy Swire 03/09/2021, 3:59 PM

## 2021-03-09 NOTE — Evaluation (Signed)
Physical Therapy Evaluation Patient Details Name: Riley Larson MRN: [id number] DOB: 25-Jul-1970 Today's Date: 03/09/2021  History of Present Illness  Pt s/p R TKR  Clinical Impression  Pt s/p R TKR and presents with decreased R LE strength/ROM and post op pain limiting functional mobility.  This session further limited by onset of bleeding at surgical site causing OOB mobilization to be deferred - RN addressing.  Pt should progress well to dc home with family assist and has first OP PT scheduled for Monday, 03/12/21.     Recommendations for follow up therapy are one component of a multi-disciplinary discharge planning process, led by the attending physician.  Recommendations may be updated based on patient status, additional functional criteria and insurance authorization.  Follow Up Recommendations Outpatient PT    Assistance Recommended at Discharge Intermittent Supervision/Assistance  Functional Status Assessment Patient has had a recent decline in their functional status and demonstrates the ability to make significant improvements in function in a reasonable and predictable amount of time.  Equipment Recommendations  None recommended by PT    Recommendations for Other Services       Precautions / Restrictions Precautions Precautions: Knee;Fall Required Braces or Orthoses: Knee Immobilizer - Right Knee Immobilizer - Right: Discontinue once straight leg raise with < 10 degree lag (Pt performed IND SLR x 10 this session) Restrictions Weight Bearing Restrictions: No Other Position/Activity Restrictions: WBAT      Mobility  Bed Mobility               General bed mobility comments: OOB deferred with onset of bleeding at incision site - RN alerted and addressing    Transfers                        Ambulation/Gait                  Stairs            Wheelchair Mobility    Modified Rankin (Stroke Patients Only)       Balance                                              Pertinent Vitals/Pain Pain Assessment: 0-10 Pain Score: 2  Pain Location: R knee Pain Descriptors / Indicators: Sore Pain Intervention(s): Limited activity within patient's tolerance;Monitored during session;Premedicated before session    Home Living Family/patient expects to be discharged to:: Private residence Living Arrangements: Spouse/significant other Available Help at Discharge: Family Type of Home: House Home Access: Stairs to enter   Technical brewer of Steps: 1   Home Layout: One level Home Equipment: Conservation officer, nature (2 wheels);BSC/3in1      Prior Function Prior Level of Function : Independent/Modified Independent                     Hand Dominance        Extremity/Trunk Assessment   Upper Extremity Assessment Upper Extremity Assessment: Overall WFL for tasks assessed    Lower Extremity Assessment Lower Extremity Assessment: RLE deficits/detail RLE Deficits / Details: AAROM at knee -5 - 80; pt performed IND SLR x 10    Cervical / Trunk Assessment Cervical / Trunk Assessment: Normal  Communication   Communication: No difficulties  Cognition Arousal/Alertness: Awake/alert Behavior During Therapy: WFL for tasks assessed/performed Overall Cognitive Status: Within Functional Limits  for tasks assessed                                          General Comments      Exercises Total Joint Exercises Ankle Circles/Pumps: AROM;Both;15 reps;Supine Quad Sets: AROM;Both;10 reps;Supine Towel Squeeze: AAROM;AROM;Right;10 reps;Supine Heel Slides: AAROM;Right;10 reps;Supine   Assessment/Plan    PT Assessment Patient needs continued PT services  PT Problem List Decreased strength;Decreased range of motion;Decreased activity tolerance;Decreased balance;Decreased mobility;Decreased knowledge of use of DME;Pain       PT Treatment Interventions DME instruction;Gait training;Stair  training;Functional mobility training;Therapeutic activities;Therapeutic exercise;Patient/family education    PT Goals (Current goals can be found in the Care Plan section)  Acute Rehab PT Goals Patient Stated Goal: Regain IND PT Goal Formulation: With patient Time For Goal Achievement: 03/16/21 Potential to Achieve Goals: Good    Frequency 7X/week   Barriers to discharge        Co-evaluation               AM-PAC PT "6 Clicks" Mobility  Outcome Measure Help needed turning from your back to your side while in a flat bed without using bedrails?: A Little Help needed moving from lying on your back to sitting on the side of a flat bed without using bedrails?: A Little Help needed moving to and from a bed to a chair (including a wheelchair)?: A Little Help needed standing up from a chair using your arms (e.g., wheelchair or bedside chair)?: A Little Help needed to walk in hospital room?: A Little Help needed climbing 3-5 steps with a railing? : A Little 6 Click Score: 18    End of Session Equipment Utilized During Treatment: Gait belt Activity Tolerance: Patient tolerated treatment well Patient left: in bed;with call bell/phone within reach;with nursing/sitter in room Nurse Communication: Other (comment) (bleeding at surgical site) PT Visit Diagnosis: Difficulty in walking, not elsewhere classified (R26.2)    Time: 6962-9528 PT Time Calculation (min) (ACUTE ONLY): 16 min   Charges:   PT Evaluation $PT Eval Low Complexity: 1 Low          [place] Pager [phone] Office [phone]   Eloyse Causey 03/09/2021, 1:28 PM

## 2021-03-09 NOTE — Anesthesia Procedure Notes (Signed)
Spinal  Patient location during procedure: OB Start time: 03/09/2021 7:38 AM End time: 03/09/2021 7:45 AM Reason for block: surgical anesthesia Staffing Performed: anesthesiologist  Anesthesiologist: Barnet Glasgow, MD Preanesthetic Checklist Completed: patient identified, IV checked, risks and benefits discussed, surgical consent, monitors and equipment checked, pre-op evaluation and timeout performed Spinal Block Patient position: sitting Prep: DuraPrep and site prepped and draped Patient monitoring: heart rate, cardiac monitor, continuous pulse ox and blood pressure Approach: midline Location: L3-4 Injection technique: single-shot Needle Needle type: Pencan  Needle gauge: 24 G Needle length: 10 cm Needle insertion depth: 6 cm Assessment Sensory level: T4 Events: CSF return Additional Notes  1 Attempt (s). Pt tolerated procedure well.

## 2021-03-09 NOTE — Op Note (Signed)
NAME: Riley Larson, [place] MEDICAL RECORD NO: [id number] ACCOUNT NO: [id number] DATE OF BIRTH: Jan 23, 1971 FACILITY: [facility] LOCATION: [facility] PHYSICIAN: W D. Valeta Harms., MD  Operative Report   DATE OF PROCEDURE: 03/09/2021  DATE OF OPERATION: 03/09/2021  PREOPERATIVE DIAGNOSIS:  Severe osteoarthritis with valgus deformity, right knee.  POSTOPERATIVE DIAGNOSIS:  Severe osteoarthritis with valgus deformity, right knee.  OPERATION:  Right total knee replacement (Attune cemented knee, size 5 femur, size 5 tibial bearing with 8 mm thickness, size 6 tibia, 35 mm all poly patella.  SURGEON: W D. Valeta Harms., MD  ASSISTANTMarjo Bicker.  ANESTHESIA: Spinal with a block.  DESCRIPTION OF PROCEDURE: Moderately severe valgus deformity was noted.  A straight skin incision was made with a medial parapatellar approach to the knee made.  We provisionally planned on resecting 10 mm, but due to bone loss on the lateral side, we revised the femoral cut to  resect 11 to have an adequate cut on the lateral condyle.  The tibia was cut about 3-4 mm below the most diseased lateral compartment, which was extremely worn.  Eventually, the extension gap to balance the ligaments was measured at 8 mm.  Sized the  femur to be a size 5 with placement of all-in-1 cutting block with the appropriate degree of external rotation, accomplishing the anterior, posterior, and chamfer cuts.  PCL was released.  Menisci were removed.  The capsular and subcutaneous tissues were  infiltrated with a mixture of Marcaine and Exparel.  Sized the tibia to be a size 6, placement of the keel cut on the tibia and the box cut on the femur.  Trials were placed at that point.  The patella was noted to be moderately thin, resected 7.5 mm of  patella.  Placement of the trial button on the patella as well.  Resolution of the valgus deformity was noted with full extension with good stability in all aspects of range of motion.  Cement was prepared on  the back table.  Inserted the components  tibia followed by femur and patella with cement in the doughy state with the trial bearing.  Cement was allowed to harden.  Excess cement was removed from the posterior aspect of the knee.  Tourniquet was released under direct vision.  No excessive  bleeding was noted.  The final bearing was placed.  Closure was effected with #1 Ethibond, 2-0 Vicryl, and Monocryl in the skin.  Taken to recovery room in stable condition.   [facility] D: 03/09/2021 9:41:34 am T: 03/09/2021 3:53:00 pm  JOB: [id number]/ [id number]

## 2021-03-09 NOTE — Brief Op Note (Addendum)
03/09/2021  10:09 AM  PATIENT:  Jennell Corner  50 y.o. male  PRE-OPERATIVE DIAGNOSIS:  OA RIGHT KNEE  POST-OPERATIVE DIAGNOSIS:  OA RIGHT KNEE  PROCEDURE:  Procedure(s): TOTAL KNEE ARTHROPLASTY (Right)  SURGEON:  Surgeon(s) and Role:    Earlie Server, MD - Primary  PHYSICIAN ASSISTANT: Chriss Czar, PA-C  ASSISTANTS: OR staff x1   ANESTHESIA:   local, regional, spinal, and IV sedation  EBL:  50 mL   BLOOD ADMINISTERED:none  DRAINS: none   LOCAL MEDICATIONS USED:  MARCAINE     SPECIMEN:  No Specimen  DISPOSITION OF SPECIMEN:  N/A  COUNTS:  YES  TOURNIQUET:  R thigh tourniquet approx 45min  DICTATION: .Other Dictation: Dictation Number unknown  PLAN OF CARE: Discharge to home after PACU  PATIENT DISPOSITION:  PACU - hemodynamically stable.   Delay start of Pharmacological VTE agent (>24hrs) due to surgical blood loss or risk of bleeding: yes

## 2021-03-09 NOTE — Discharge Instructions (Signed)

## 2021-03-12 ENCOUNTER — Encounter (HOSPITAL_COMMUNITY): Payer: Self-pay

## 2021-03-12 ENCOUNTER — Ambulatory Visit (HOSPITAL_COMMUNITY): Payer: BC Managed Care – PPO | Attending: Orthopedic Surgery

## 2021-03-12 ENCOUNTER — Other Ambulatory Visit: Payer: Self-pay

## 2021-03-12 DIAGNOSIS — M25561 Pain in right knee: Secondary | ICD-10-CM | POA: Diagnosis present

## 2021-03-12 DIAGNOSIS — M6281 Muscle weakness (generalized): Secondary | ICD-10-CM | POA: Insufficient documentation

## 2021-03-12 DIAGNOSIS — R262 Difficulty in walking, not elsewhere classified: Secondary | ICD-10-CM | POA: Diagnosis present

## 2021-03-12 NOTE — Therapy (Signed)
[facility], [place], [address] Phone: [phone]   Fax:  [phone]  Physical Therapy Evaluation  Patient Details  Name: Riley Larson MRN: [id number] Date of Birth: [birth year] Referring Provider (PT): French Ana   Encounter Date: 03/12/2021   PT End of Session - 03/12/21 0814     Visit Number 1    Number of Visits 18    Date for PT Re-Evaluation 04/23/21    Authorization Type BCBS Comm PPO    PT Start Time 0815    PT Stop Time 0900    PT Time Calculation (min) 45 min    Equipment Utilized During Treatment Gait belt    Activity Tolerance Patient tolerated treatment well    Behavior During Therapy [facility] for tasks assessed/performed             Past Medical History:  Diagnosis Date   Arthritis    Hypertension     Past Surgical History:  Procedure Laterality Date   BACK SURGERY     COLONOSCOPY WITH PROPOFOL N/A 03/14/2020   Procedure: COLONOSCOPY WITH PROPOFOL;  Surgeon: Harvel Quale, MD;  [facility];  Service: Gastroenterology;  Laterality: N/A;  9:15   LAPAROSCOPIC GASTRIC BANDING     left index finger surgery       There were no vitals filed for this visit.    Subjective Assessment - 03/12/21 0817     Subjective Right TKR on 03/09/21.    Currently in Pain? Yes    Pain Score 9     Pain Location Knee    Pain Orientation Right    Pain Descriptors / Indicators Aching;Constant    Pain Type Surgical pain    Pain Frequency Constant    Aggravating Factors  WBing, bending                [facility] PT Assessment - 03/12/21 0001       Assessment   Medical Diagnosis Right TKR    Referring Provider (PT) Caffrey    Onset Date/Surgical Date 03/09/21      Home Environment   Living Environment Private residence    Living Arrangements Spouse/significant other    Available Help at Discharge Family    Type of [place] One level      Prior Function    Level of Independence Independent    Vocation Full time employment    Vocation Requirements [employer]      ROM / Strength   AROM / PROM / Strength AROM;Strength      AROM   AROM Assessment Site Knee    Right/Left Knee Right    Right Knee Extension 15   lacking   Right Knee Flexion 70      Strength   Overall Strength Deficits    Overall Strength Comments RLE 3/5 gross strength      Transfers   Transfers Sit to Stand    Sit to Stand 6: Modified independent (Device/Increase time)      Ambulation/Gait   Ambulation/Gait Yes    Ambulation/Gait Assistance 6: Modified independent (Device/Increase time)    Ambulation Distance (Feet) 185 Feet    Assistive device Rolling walker    Gait Pattern Step-to pattern;Antalgic    Ambulation Surface Level;Indoor    Stairs Yes    Stairs Assistance 6: Modified independent (Device/Increase time)    Stair Management Technique Two rails;Step to pattern  Gait Comments 2MWT                        Objective measurements completed on examination: See above findings.       [place] [facility] Adult PT Treatment/Exercise - 03/12/21 0001       Exercises   Exercises Knee/Hip      Knee/Hip Exercises: Seated   Heel Slides AAROM;2 sets;10 reps      Knee/Hip Exercises: Supine   Quad Sets Strengthening;Right;2 sets;10 reps    Heel Slides AAROM;Right;2 sets;10 reps                     PT Education - 03/12/21 0837     Education Details discussion regarding posture/positioning for limb, elevation, HEP initiation    Person(s) Educated Patient    Methods Explanation    Comprehension Verbalized understanding              PT Short Term Goals - 03/12/21 0846       PT SHORT TERM GOAL #1   Title Patient will report understanding and regular compliance with HEP to improve mobility and decrease pain.    Time 3    Period Weeks    Status New    Target Date 04/02/21      PT SHORT TERM GOAL #2   Title Demonstrate right  knee extension 0 degrees and flexion 90 degrees to improve gait mechanics    Baseline lacking 15 extension, 70 flexion    Time 3    Period Weeks    Status New    Target Date 04/02/21      PT SHORT TERM GOAL #3   Title Ambulate 300 ft 2MWT with least restrictive AD to improve gait speed    Baseline 185 ft with RW    Time 3    Period Weeks    Status New    Target Date 04/02/21               PT Long Term Goals - 03/12/21 0848       PT LONG TERM GOAL #1   Title Patient will report an improvement of at least 50% in overall symptoms  to improve QOL.    Baseline 9/10 right knee pain    Time 6    Period Weeks    Status New    Target Date 04/23/21      PT LONG TERM GOAL #2   Title Ambulate independently with normalized pattern and stairs with reciprocal pattern    Time 6    Period Weeks    Status New    Target Date 04/23/21                    Plan - 03/12/21 0841     Clinical Impression Statement 51 yo man with new onset of right knee pain and gait dysfunction following right total knee replacement. Demonstrates ROM and strength deficits and reduced functional activity tolerance. PT services indicated to develop/instruct in HEP, improve LE ROM/strength, facilitate normalized gait pattern and restore capabilities to PLOF    Personal Factors and Comorbidities Profession    Examination-Activity Limitations Bend;Carry;Lift;Toileting;Stairs;Squat;Sleep;Locomotion Level;Transfers    Examination-Participation Restrictions Cleaning;Community Activity;Occupation    Stability/Clinical Decision Making Stable/Uncomplicated    Clinical Decision Making Low    Rehab Potential Excellent    PT Frequency 3x / week    PT Duration 6 weeks    PT Treatment/Interventions ADLs/Self Care Home  Management;Electrical Stimulation;DME Instruction;Gait training;Functional mobility training;Therapeutic activities;Therapeutic exercise;Balance training;Neuromuscular re-education;Patient/family  education;Passive range of motion    PT Next Visit Plan continue with acute TKR rehab    PT Home Exercise Plan QS, heel slides (supine and seated)    Consulted and Agree with Plan of Care Patient             Patient will benefit from skilled therapeutic intervention in order to improve the following deficits and impairments:  Abnormal gait, Decreased activity tolerance, Decreased mobility, Decreased knowledge of use of DME, Decreased range of motion, Decreased strength, Increased edema, Difficulty walking, Impaired perceived functional ability, Pain  Visit Diagnosis: Acute pain of right knee  Difficulty in walking, not elsewhere classified  Muscle weakness (generalized)     Problem List Patient Active Problem List   Diagnosis Date Noted   CELLULITIS, LEFT HAND 10/16/2009    Toniann Fail, PT 03/12/2021, 8:51 AM  [place], [place], [address] Phone: [phone]   Fax:  [phone]  Name: KETIH GOODIE MRN: [id number] Date of Birth: 11-Apr-1970

## 2021-03-13 ENCOUNTER — Encounter (HOSPITAL_COMMUNITY): Payer: Self-pay | Admitting: Orthopedic Surgery

## 2021-03-14 ENCOUNTER — Ambulatory Visit (HOSPITAL_COMMUNITY): Payer: BC Managed Care – PPO | Admitting: Physical Therapy

## 2021-03-14 ENCOUNTER — Other Ambulatory Visit: Payer: Self-pay

## 2021-03-14 ENCOUNTER — Encounter (HOSPITAL_COMMUNITY): Payer: Self-pay | Admitting: Physical Therapy

## 2021-03-14 DIAGNOSIS — M6281 Muscle weakness (generalized): Secondary | ICD-10-CM

## 2021-03-14 DIAGNOSIS — R262 Difficulty in walking, not elsewhere classified: Secondary | ICD-10-CM

## 2021-03-14 DIAGNOSIS — M25561 Pain in right knee: Secondary | ICD-10-CM

## 2021-03-14 NOTE — Therapy (Signed)
[place], [place], [address] Phone: [phone]   Fax:  [phone]  Physical Therapy Treatment  Patient Details  Name: Riley Larson MRN: [id number] Date of Birth: [birth year] Referring Provider (PT): French Ana   Encounter Date: 03/14/2021   PT End of Session - 03/14/21 0830     Visit Number 2    Number of Visits 18    Date for PT Re-Evaluation 04/23/21    Authorization Type [employer]BS Comm PPO    PT Start Time 0818    PT Stop Time 0858    PT Time Calculation (min) 40 min    Equipment Utilized During Treatment Gait belt    Activity Tolerance Patient tolerated treatment well    Behavior During Therapy [facility] for tasks assessed/performed             Past Medical History:  Diagnosis Date   Arthritis    Hypertension     Past Surgical History:  Procedure Laterality Date   BACK SURGERY     COLONOSCOPY WITH PROPOFOL N/A 03/14/2020   Procedure: COLONOSCOPY WITH PROPOFOL;  Surgeon: Harvel Quale, MD;  [facility];  Service: Gastroenterology;  Laterality: N/A;  9:15   LAPAROSCOPIC GASTRIC BANDING     left index finger surgery      TOTAL KNEE ARTHROPLASTY Right 03/09/2021   Procedure: TOTAL KNEE ARTHROPLASTY;  Surgeon: Earlie Server, MD;  Location: [facility];  Service: Orthopedics;  Laterality: Right;    There were no vitals filed for this visit.   Subjective Assessment - 03/14/21 0824     Subjective Patient reports increased swelling and muscle soreness today. He went to [facility] yesterday with his wife and walked quite a bit. he is ambulating with no AD. He was concerned about the swelling and discomfort.    Currently in Pain? Yes    Pain Score 8     Pain Location Knee    Pain Orientation Right    Pain Descriptors / Indicators Aching    Pain Type Surgical pain    Pain Onset In the past 7 days    Pain Frequency Constant                               [facility] Adult  PT Treatment/Exercise - 03/14/21 0001       Knee/Hip Exercises: Standing   Heel Raises Both;10 reps      Knee/Hip Exercises: Seated   Sit to Sand 1 set;10 reps;with UE support      Knee/Hip Exercises: Supine   Quad Sets Strengthening;Right;1 set;15 reps    Heel Slides AAROM;Right;2 sets;10 reps    Bridges 2 sets;10 reps    Straight Leg Raises Right;2 sets;10 reps    Knee Extension AAROM    Knee Extension Limitations 5    Knee Flexion AAROM;Right    Knee Flexion Limitations 100                       PT Short Term Goals - 03/12/21 0846       PT SHORT TERM GOAL #1   Title Patient will report understanding and regular compliance with HEP to improve mobility and decrease pain.    Time 3    Period Weeks    Status New    Target Date 04/02/21      PT SHORT TERM GOAL #2   Title Demonstrate  right knee extension 0 degrees and flexion 90 degrees to improve gait mechanics    Baseline lacking 15 extension, 70 flexion    Time 3    Period Weeks    Status New    Target Date 04/02/21      PT SHORT TERM GOAL #3   Title Ambulate 300 ft 2MWT with least restrictive AD to improve gait speed    Baseline 185 ft with RW    Time 3    Period Weeks    Status New    Target Date 04/02/21               PT Long Term Goals - 03/12/21 0848       PT LONG TERM GOAL #1   Title Patient will report an improvement of at least 50% in overall symptoms  to improve QOL.    Baseline 9/10 right knee pain    Time 6    Period Weeks    Status New    Target Date 04/23/21      PT LONG TERM GOAL #2   Title Ambulate independently with normalized pattern and stairs with reciprocal pattern    Time 6    Period Weeks    Status New    Target Date 04/23/21                   Plan - 03/14/21 0855     Clinical Impression Statement Patient showing good progress despite elevated pain levels. Educated patient on healing process and pacing of activity to tolerance. Patient able to  achieve 100 degrees of knee flexion using strap. Progressed LE strengthening with added bridging and sit to stands. Patient cued on proper form for target muscle activation. Issued HEP handout. Patient will continue to benefit from skilled therapy services to reduce deficits and improve functional ability.    Personal Factors and Comorbidities Profession    Examination-Activity Limitations Bend;Carry;Lift;Toileting;Stairs;Squat;Sleep;Locomotion Level;Transfers    Examination-Participation Restrictions Cleaning;Community Activity;Occupation    Stability/Clinical Decision Making Stable/Uncomplicated    Rehab Potential Excellent    PT Frequency 3x / week    PT Duration 6 weeks    PT Treatment/Interventions ADLs/Self Care Home Management;Electrical Stimulation;DME Instruction;Gait training;Functional mobility training;Therapeutic activities;Therapeutic exercise;Balance training;Neuromuscular re-education;Patient/family education;Passive range of motion    PT Next Visit Plan continue with acute TKR rehab    PT Home Exercise Plan QS, heel slides (supine and seated) 12/7 bridge, SLR, heel raises    Consulted and Agree with Plan of Care Patient             Patient will benefit from skilled therapeutic intervention in order to improve the following deficits and impairments:  Abnormal gait, Decreased activity tolerance, Decreased mobility, Decreased knowledge of use of DME, Decreased range of motion, Decreased strength, Increased edema, Difficulty walking, Impaired perceived functional ability, Pain  Visit Diagnosis: Acute pain of right knee  Difficulty in walking, not elsewhere classified  Muscle weakness (generalized)     Problem List Patient Active Problem List   Diagnosis Date Noted   CELLULITIS, LEFT HAND 10/16/2009   8:57 AM, 03/14/21 Josue Hector PT DPT  Physical Therapist with [facility]  [phone], [place], [address] Phone: [phone]   Fax:  [phone]  Name: Riley Larson MRN: [id number] Date of Birth: March 01, 1971

## 2021-03-14 NOTE — Patient Instructions (Signed)
Access Code: [email or web address] URL: [email or web address] Date: 03/14/2021 Prepared by: Josue Hector  Exercises Supine Bridge - 3 x daily - 7 x weekly - 2 sets - 10 reps Active Straight Leg Raise with Quad Set - 3 x daily - 7 x weekly - 2 sets - 10 reps Standing Heel Raise with Support - 3 x daily - 7 x weekly - 2 sets - 10 reps

## 2021-03-16 ENCOUNTER — Other Ambulatory Visit: Payer: Self-pay

## 2021-03-16 ENCOUNTER — Ambulatory Visit (HOSPITAL_COMMUNITY): Payer: BC Managed Care – PPO

## 2021-03-16 DIAGNOSIS — M25561 Pain in right knee: Secondary | ICD-10-CM | POA: Diagnosis not present

## 2021-03-16 DIAGNOSIS — R262 Difficulty in walking, not elsewhere classified: Secondary | ICD-10-CM

## 2021-03-16 DIAGNOSIS — M6281 Muscle weakness (generalized): Secondary | ICD-10-CM

## 2021-03-16 NOTE — Therapy (Signed)
[place], [place], [address] Phone: [phone]   Fax:  [phone]  Physical Therapy Treatment  Patient Details  Name: Riley Larson MRN: [id number] Date of Birth: 1970/05/14 Referring Provider (PT): French Ana   Encounter Date: 03/16/2021   PT End of Session - 03/16/21 0817     Visit Number 3    Number of Visits 18    Date for PT Re-Evaluation 04/23/21    Authorization Type [employer]BS Comm PPO    PT Start Time 0815    PT Stop Time 0900    PT Time Calculation (min) 45 min    Equipment Utilized During Treatment Gait belt    Activity Tolerance Patient tolerated treatment well    Behavior During Therapy Riley Larson for tasks assessed/performed             Past Medical History:  Diagnosis Date   Arthritis    Hypertension     Past Surgical History:  Procedure Laterality Date   BACK SURGERY     COLONOSCOPY WITH PROPOFOL N/A 03/14/2020   Procedure: COLONOSCOPY WITH PROPOFOL;  Surgeon: Harvel Quale, MD;  [facility];  Service: Gastroenterology;  Laterality: N/A;  9:15   LAPAROSCOPIC GASTRIC BANDING     left index finger surgery      TOTAL KNEE ARTHROPLASTY Right 03/09/2021   Procedure: TOTAL KNEE ARTHROPLASTY;  Surgeon: Earlie Server, MD;  [facility]: [facility];  Service: Orthopedics;  Laterality: Right;    There were no vitals filed for this visit.   Subjective Assessment - 03/16/21 0856     Subjective Feeling a bit better with continued swelling    Currently in Pain? Yes    Pain Score 7     Pain Location Knee    Pain Orientation Right    Pain Descriptors / Indicators Aching;Sore    Pain Type Surgical pain    Pain Onset In the past 7 days                [facility] PT Assessment - 03/16/21 0001       Assessment   Medical Diagnosis Right TKR    Referring Provider (PT) French Ana    Next MD Visit 03/22/21                           [facility] Adult PT Treatment/Exercise -  03/16/21 0001       Ambulation/Gait   Ambulation/Gait Yes    Ambulation/Gait Assistance 6: Modified independent (Device/Increase time)    Assistive device Straight cane    Gait Pattern Step-through pattern;Antalgic    Ambulation Surface Level;Indoor    Gait Comments training in sequence and use of cane to minimize gait deviations      Knee/Hip Exercises: Standing   Heel Raises Both;3 sets;10 reps   on airex pad   Other Standing Knee Exercises Tandem stance 3x30 sec left/right      Knee/Hip Exercises: Seated   Long Arc Quad AROM;Right;3 sets;10 reps    Heel Slides AROM;1 set   2 min   Sit to Sand 1 set;10 reps;without UE support   elevated EOM and cues for glute recruit     Knee/Hip Exercises: Supine   Quad Sets Strengthening;Right;3 sets;10 reps    Heel Slides AAROM;Right;2 sets;10 reps    Knee Extension AROM;Right    Knee Extension Limitations 3-5    Knee Flexion AROM;Right    Knee Flexion Limitations  95    Other Supine Knee/Hip Exercises knee flexion with swiss ball, oscillations on swiss ball 2x2 min      Knee/Hip Exercises: Prone   Hamstring Curl 2 sets;10 reps                       PT Short Term Goals - 03/12/21 0846       PT SHORT TERM GOAL #1   Title Patient will report understanding and regular compliance with HEP to improve mobility and decrease pain.    Time 3    Period Weeks    Status New    Target Date 04/02/21      PT SHORT TERM GOAL #2   Title Demonstrate right knee extension 0 degrees and flexion 90 degrees to improve gait mechanics    Baseline lacking 15 extension, 70 flexion    Time 3    Period Weeks    Status New    Target Date 04/02/21      PT SHORT TERM GOAL #3   Title Ambulate 300 ft 2MWT with least restrictive AD to improve gait speed    Baseline 185 ft with RW    Time 3    Period Weeks    Status New    Target Date 04/02/21               PT Long Term Goals - 03/12/21 0848       PT LONG TERM GOAL #1   Title  Patient will report an improvement of at least 50% in overall symptoms  to improve QOL.    Baseline 9/10 right knee pain    Time 6    Period Weeks    Status New    Target Date 04/23/21      PT LONG TERM GOAL #2   Title Ambulate independently with normalized pattern and stairs with reciprocal pattern    Time 6    Period Weeks    Status New    Target Date 04/23/21                   Plan - 03/16/21 0857     Clinical Impression Statement TOlerating tx sessions well.  Practice with cane improved gait pattern with decrease in lateral trunk displacement. Continues to exhibit RLE weakness and ROM limitations. Continued sessions indicated to improve LE function and mobility.    Personal Factors and Comorbidities Profession    Examination-Activity Limitations Bend;Carry;Lift;Toileting;Stairs;Squat;Sleep;Locomotion Level;Transfers    Examination-Participation Restrictions Cleaning;Community Activity;Occupation    Stability/Clinical Decision Making Stable/Uncomplicated    Rehab Potential Excellent    PT Frequency 3x / week    PT Duration 6 weeks    PT Treatment/Interventions ADLs/Self Care Home Management;Electrical Stimulation;DME Instruction;Gait training;Functional mobility training;Therapeutic activities;Therapeutic exercise;Balance training;Neuromuscular re-education;Patient/family education;Passive range of motion    PT Next Visit Plan continue with acute TKR rehab    PT Home Exercise Plan QS, heel slides (supine and seated) 12/7 bridge, SLR, heel raises    Consulted and Agree with Plan of Care Patient             Patient will benefit from skilled therapeutic intervention in order to improve the following deficits and impairments:  Abnormal gait, Decreased activity tolerance, Decreased mobility, Decreased knowledge of use of DME, Decreased range of motion, Decreased strength, Increased edema, Difficulty walking, Impaired perceived functional ability, Pain  Visit  Diagnosis: Acute pain of right knee  Difficulty in walking, not elsewhere classified  Muscle  weakness (generalized)     Problem List Patient Active Problem List   Diagnosis Date Noted   CELLULITIS, LEFT HAND 10/16/2009    Toniann Fail, PT 03/16/2021, 9:01 AM  [place], [place], [address] Phone: [phone]   Fax:  [phone]  Name: Riley Larson MRN: [id number] Date of Birth: 1970-08-22

## 2021-03-19 ENCOUNTER — Other Ambulatory Visit: Payer: Self-pay

## 2021-03-19 ENCOUNTER — Ambulatory Visit (HOSPITAL_COMMUNITY): Payer: BC Managed Care – PPO | Admitting: Physical Therapy

## 2021-03-19 DIAGNOSIS — M25561 Pain in right knee: Secondary | ICD-10-CM

## 2021-03-19 DIAGNOSIS — R262 Difficulty in walking, not elsewhere classified: Secondary | ICD-10-CM

## 2021-03-19 DIAGNOSIS — M6281 Muscle weakness (generalized): Secondary | ICD-10-CM

## 2021-03-19 NOTE — Therapy (Signed)
[place], [place], [address] Phone: [phone]   Fax:  [phone]  Physical Therapy Treatment  Patient Details  Name: Riley Larson MRN: [id number] Date of Birth: [birth year] Referring Provider (PT): French Ana   Encounter Date: 03/19/2021   PT End of Session - 03/19/21 0825     Visit Number 4    Number of Visits 18    Date for PT Re-Evaluation 04/23/21    Authorization Type BCBS Comm PPO    PT Start Time 0820    PT Stop Time 0903    PT Time Calculation (min) 43 min    Equipment Utilized During Treatment Gait belt    Activity Tolerance Patient tolerated treatment well    Behavior During Therapy [facility] for tasks assessed/performed             Past Medical History:  Diagnosis Date   Arthritis    Hypertension     Past Surgical History:  Procedure Laterality Date   BACK SURGERY     COLONOSCOPY WITH PROPOFOL N/A 03/14/2020   Procedure: COLONOSCOPY WITH PROPOFOL;  Surgeon: Harvel Quale, MD;  [facility];  Service: Gastroenterology;  Laterality: N/A;  9:15   LAPAROSCOPIC GASTRIC BANDING     left index finger surgery      TOTAL KNEE ARTHROPLASTY Right 03/09/2021   Procedure: TOTAL KNEE ARTHROPLASTY;  Surgeon: Earlie Server, MD;  Location: [facility];  Service: Orthopedics;  Laterality: Right;    There were no vitals filed for this visit.   Subjective Assessment - 03/19/21 0824     Subjective He has tried to take it easy. His calf is really sore today. He is walking with a cane his son made    Currently in Pain? Yes    Pain Score 9     Pain Location Calf    Pain Orientation Right    Pain Descriptors / Indicators Sore    Pain Type Acute pain    Pain Onset In the past 7 days                               [facility] Adult PT Treatment/Exercise - 03/19/21 0001       Knee/Hip Exercises: Stretches   Gastroc Stretch Right;3 reps;30 seconds    Gastroc Stretch  Limitations supine with strap      Knee/Hip Exercises: Aerobic   Recumbent Bike 4 minutes rocking (seat 18) EOS for mobility      Knee/Hip Exercises: Standing   Heel Raises Both;2 sets;10 reps      Knee/Hip Exercises: Supine   Quad Sets Right;1 set;15 reps    Heel Slides Right;1 set;15 reps    Heel Slides Limitations 5" hold    Bridges 2 sets;10 reps    Straight Leg Raises Right;1 set;15 reps    Knee Extension AROM;Right    Knee Extension Limitations 5    Knee Flexion AROM;Right    Knee Flexion Limitations 102    Other Supine Knee/Hip Exercises heel prop with quad set for knee extension 10 x 5"                       PT Short Term Goals - 03/12/21 0846       PT SHORT TERM GOAL #1   Title Patient will report understanding and regular compliance with HEP to improve mobility and decrease pain.  Time 3    Period Weeks    Status New    Target Date 04/02/21      PT SHORT TERM GOAL #2   Title Demonstrate right knee extension 0 degrees and flexion 90 degrees to improve gait mechanics    Baseline lacking 15 extension, 70 flexion    Time 3    Period Weeks    Status New    Target Date 04/02/21      PT SHORT TERM GOAL #3   Title Ambulate 300 ft 2MWT with least restrictive AD to improve gait speed    Baseline 185 ft with RW    Time 3    Period Weeks    Status New    Target Date 04/02/21               PT Long Term Goals - 03/12/21 0848       PT LONG TERM GOAL #1   Title Patient will report an improvement of at least 50% in overall symptoms  to improve QOL.    Baseline 9/10 right knee pain    Time 6    Period Weeks    Status New    Target Date 04/23/21      PT LONG TERM GOAL #2   Title Ambulate independently with normalized pattern and stairs with reciprocal pattern    Time 6    Period Weeks    Status New    Target Date 04/23/21                   Plan - 03/19/21 0906     Clinical Impression Statement Patient showing improved knee  flexion, remains limited with extension. Discussed addition of 2-3lb ankle weights with heel prop for improved extension. Patient noting new onset calf pain and concerned about DVT. Homans negative, does have increased point tenderness, coloration appears normal. Reviewed Wells criteria for DVT and educated patient on predictive rules. Instructed patient to contact MD if noting 3 or more factors. Patient noting improved symptoms and decreased pain EOS. Added bike for mobility. Will continue to monitor and progress activity as tolerated for improved functional mobility.    Personal Factors and Comorbidities Profession    Examination-Activity Limitations Bend;Carry;Lift;Toileting;Stairs;Squat;Sleep;Locomotion Level;Transfers    Examination-Participation Restrictions Cleaning;Community Activity;Occupation    Stability/Clinical Decision Making Stable/Uncomplicated    Rehab Potential Excellent    PT Frequency 3x / week    PT Duration 6 weeks    PT Treatment/Interventions ADLs/Self Care Home Management;Electrical Stimulation;DME Instruction;Gait training;Functional mobility training;Therapeutic activities;Therapeutic exercise;Balance training;Neuromuscular re-education;Patient/family education;Passive range of motion    PT Next Visit Plan continue with acute TKR rehab    PT Home Exercise Plan QS, heel slides (supine and seated) 12/7 bridge, SLR, heel raises    Consulted and Agree with Plan of Care Patient             Patient will benefit from skilled therapeutic intervention in order to improve the following deficits and impairments:  Abnormal gait, Decreased activity tolerance, Decreased mobility, Decreased knowledge of use of DME, Decreased range of motion, Decreased strength, Increased edema, Difficulty walking, Impaired perceived functional ability, Pain  Visit Diagnosis: Acute pain of right knee  Difficulty in walking, not elsewhere classified  Muscle weakness (generalized)     Problem  List Patient Active Problem List   Diagnosis Date Noted   CELLULITIS, LEFT HAND 10/16/2009   9:44 AM, 03/19/21 Josue Hector PT DPT  Physical Therapist with [facility]  Hospital  [phone]  [facility] [address] [place], [place], [address] Phone: [phone]   Fax:  [phone]  Name: GILLIAM HAWKES MRN: [id number] Date of Birth: May 28, 1970

## 2021-03-19 NOTE — Patient Instructions (Signed)
Access Code: [email or web address] URL: [email or web address] Date: 03/19/2021 Prepared by: Josue Hector  Exercises Seated Calf Stretch with Strap - 3 x daily - 7 x weekly - 1 sets - 3 reps - 30 second hold Supine Calf Stretch with Strap - 3 x daily - 7 x weekly - 1 sets - 3 reps - 30 second hold

## 2021-03-20 ENCOUNTER — Ambulatory Visit (HOSPITAL_COMMUNITY)
Admission: RE | Admit: 2021-03-20 | Discharge: 2021-03-20 | Disposition: A | Discharge status: Home / Self Care | Payer: BC Managed Care – PPO | Source: Ambulatory Visit | Attending: Orthopedic Surgery | Admitting: Orthopedic Surgery

## 2021-03-20 ENCOUNTER — Other Ambulatory Visit (HOSPITAL_COMMUNITY): Payer: Self-pay | Admitting: Orthopedic Surgery

## 2021-03-20 DIAGNOSIS — M79604 Pain in right leg: Secondary | ICD-10-CM | POA: Insufficient documentation

## 2021-03-20 DIAGNOSIS — M7989 Other specified soft tissue disorders: Secondary | ICD-10-CM

## 2021-03-21 ENCOUNTER — Encounter (HOSPITAL_COMMUNITY): Payer: Self-pay | Admitting: Physical Therapy

## 2021-03-21 ENCOUNTER — Ambulatory Visit (HOSPITAL_COMMUNITY): Payer: BC Managed Care – PPO | Admitting: Physical Therapy

## 2021-03-21 ENCOUNTER — Other Ambulatory Visit: Payer: Self-pay

## 2021-03-21 DIAGNOSIS — R262 Difficulty in walking, not elsewhere classified: Secondary | ICD-10-CM

## 2021-03-21 DIAGNOSIS — M6281 Muscle weakness (generalized): Secondary | ICD-10-CM

## 2021-03-21 DIAGNOSIS — M25561 Pain in right knee: Secondary | ICD-10-CM | POA: Diagnosis not present

## 2021-03-21 NOTE — Therapy (Signed)
[place], [place], [address] Phone: [phone]   Fax:  [phone]  Physical Therapy Treatment  Patient Details  Name: Riley Larson MRN: [id number] Date of Birth: 1971-01-01 Referring Provider (PT): French Ana   Encounter Date: 03/21/2021   PT End of Session - 03/21/21 0826     Visit Number 5    Number of Visits 18    Date for PT Re-Evaluation 04/23/21    Authorization Type BCBS Comm PPO    PT Start Time 0821    PT Stop Time 0901    PT Time Calculation (min) 40 min    Equipment Utilized During Treatment --    Activity Tolerance Patient tolerated treatment well    Behavior During Therapy [facility] for tasks assessed/performed             Past Medical History:  Diagnosis Date   Arthritis    Hypertension     Past Surgical History:  Procedure Laterality Date   BACK SURGERY     COLONOSCOPY WITH PROPOFOL N/A 03/14/2020   Procedure: COLONOSCOPY WITH PROPOFOL;  Surgeon: Harvel Quale, MD;  [facility];  Service: Gastroenterology;  Laterality: N/A;  9:15   LAPAROSCOPIC GASTRIC BANDING     left index finger surgery      TOTAL KNEE ARTHROPLASTY Right 03/09/2021   Procedure: TOTAL KNEE ARTHROPLASTY;  Surgeon: Earlie Server, MD;  [facility]: [facility];  Service: Orthopedics;  Laterality: Right;    There were no vitals filed for this visit.   Subjective Assessment - 03/21/21 0825     Subjective Patient says he went to MD yesterday to make sure he doesnt have a blood clot. They did ultrasound and he does not. His calf still hurts. It is swollen and he feels leg is stiff today.    Currently in Pain? Yes    Pain Score 9     Pain Location Calf    Pain Orientation Right    Pain Descriptors / Indicators Sore;Tightness    Pain Type Acute pain    Pain Onset In the past 7 days    Pain Frequency Constant                               [facility] Adult PT Treatment/Exercise -  03/21/21 0001       Knee/Hip Exercises: Stretches   Gastroc Stretch Right;3 reps;30 seconds    Gastroc Stretch Limitations slant board      Knee/Hip Exercises: Aerobic   Recumbent Bike 4 minutes full revolution for mobility      Knee/Hip Exercises: Standing   Heel Raises Both;2 sets;10 reps    Other Standing Knee Exercises Tandem stance 2x30 sec left/right      Knee/Hip Exercises: Seated   Sit to Sand 2 sets;10 reps;without UE support      Knee/Hip Exercises: Supine   Quad Sets Right;1 set;15 reps;10 reps    Heel Slides Right;1 set;10 reps    Heel Slides Limitations 5" hold    Bridges 2 sets;10 reps    Straight Leg Raises Right;1 set;10 reps    Knee Extension AROM;Right    Knee Extension Limitations 2    Knee Flexion AROM;Right    Knee Flexion Limitations 109      Manual Therapy   Manual Therapy Edema management    Manual therapy comments All manual completed separately from other skilled interventions  Edema Management to RT calf with leg elevated for decreased swelling                       PT Short Term Goals - 03/12/21 0846       PT SHORT TERM GOAL #1   Title Patient will report understanding and regular compliance with HEP to improve mobility and decrease pain.    Time 3    Period Weeks    Status New    Target Date 04/02/21      PT SHORT TERM GOAL #2   Title Demonstrate right knee extension 0 degrees and flexion 90 degrees to improve gait mechanics    Baseline lacking 15 extension, 70 flexion    Time 3    Period Weeks    Status New    Target Date 04/02/21      PT SHORT TERM GOAL #3   Title Ambulate 300 ft 2MWT with least restrictive AD to improve gait speed    Baseline 185 ft with RW    Time 3    Period Weeks    Status New    Target Date 04/02/21               PT Long Term Goals - 03/12/21 0848       PT LONG TERM GOAL #1   Title Patient will report an improvement of at least 50% in overall symptoms  to improve QOL.     Baseline 9/10 right knee pain    Time 6    Period Weeks    Status New    Target Date 04/23/21      PT LONG TERM GOAL #2   Title Ambulate independently with normalized pattern and stairs with reciprocal pattern    Time 6    Period Weeks    Status New    Target Date 04/23/21                   Plan - 03/21/21 1021     Clinical Impression Statement Patient shows continued improvement in knee AROM but limited by swelling in calf and pain. Patient noting difficulty completing standing calf stretching as a result. Introduced Conservation officer, nature which was tolerated well. Performed manual edema message and educated patient on purpose and function. Patient did note improve symptoms and decreased pain with WB following manual. Will continue to monitor, and progress LE strengthening activity as tolerated for improved functional mobility.    Personal Factors and Comorbidities Profession    Examination-Activity Limitations Bend;Carry;Lift;Toileting;Stairs;Squat;Sleep;Locomotion Level;Transfers    Examination-Participation Restrictions Cleaning;Community Activity;Occupation    Stability/Clinical Decision Making Stable/Uncomplicated    Rehab Potential Excellent    PT Frequency 3x / week    PT Duration 6 weeks    PT Treatment/Interventions ADLs/Self Care Home Management;Electrical Stimulation;DME Instruction;Gait training;Functional mobility training;Therapeutic activities;Therapeutic exercise;Balance training;Neuromuscular re-education;Patient/family education;Passive range of motion    PT Next Visit Plan continue with acute TKR rehab    PT Home Exercise Plan QS, heel slides (supine and seated) 12/7 bridge, SLR, heel raises    Consulted and Agree with Plan of Care Patient             Patient will benefit from skilled therapeutic intervention in order to improve the following deficits and impairments:  Abnormal gait, Decreased activity tolerance, Decreased mobility, Decreased knowledge of use  of DME, Decreased range of motion, Decreased strength, Increased edema, Difficulty walking, Impaired perceived functional ability, Pain  Visit Diagnosis: Acute  pain of right knee  Difficulty in walking, not elsewhere classified  Muscle weakness (generalized)     Problem List Patient Active Problem List   Diagnosis Date Noted   CELLULITIS, LEFT HAND 10/16/2009   10:26 AM, 03/21/21 Josue Hector PT DPT  Physical Therapist with [facility]  [phone] [place], [place], [address] Phone: [phone]   Fax:  [phone]  Name: Riley Larson MRN: [id number] Date of Birth: 19-Apr-1970

## 2021-03-23 ENCOUNTER — Ambulatory Visit (HOSPITAL_COMMUNITY): Payer: BC Managed Care – PPO

## 2021-03-23 ENCOUNTER — Encounter (HOSPITAL_COMMUNITY): Payer: Self-pay

## 2021-03-23 ENCOUNTER — Other Ambulatory Visit: Payer: Self-pay

## 2021-03-23 DIAGNOSIS — M25561 Pain in right knee: Secondary | ICD-10-CM

## 2021-03-23 DIAGNOSIS — M6281 Muscle weakness (generalized): Secondary | ICD-10-CM

## 2021-03-23 DIAGNOSIS — R262 Difficulty in walking, not elsewhere classified: Secondary | ICD-10-CM

## 2021-03-23 NOTE — Therapy (Signed)
[place], [place], [address] Phone: [phone]   Fax:  [phone]  Physical Therapy Treatment  Patient Details  Name: Riley Larson MRN: [id number] Date of Birth: 09/12/1970 Referring Provider (PT): French Ana   Encounter Date: 03/23/2021   PT End of Session - 03/23/21 0831     Visit Number 6    Number of Visits 18    Date for PT Re-Evaluation 04/23/21    Authorization Type BCBS Comm PPO    PT Start Time 0828    PT Stop Time 0912    PT Time Calculation (min) 44 min    Activity Tolerance Patient tolerated treatment well    Behavior During Therapy [facility] for tasks assessed/performed             Past Medical History:  Diagnosis Date   Arthritis    Hypertension     Past Surgical History:  Procedure Laterality Date   BACK SURGERY     COLONOSCOPY WITH PROPOFOL N/A 03/14/2020   Procedure: COLONOSCOPY WITH PROPOFOL;  Surgeon: Harvel Quale, MD;  [facility];  Service: Gastroenterology;  Laterality: N/A;  9:15   LAPAROSCOPIC GASTRIC BANDING     left index finger surgery      TOTAL KNEE ARTHROPLASTY Right 03/09/2021   Procedure: TOTAL KNEE ARTHROPLASTY;  Surgeon: Earlie Server, MD;  Location: [facility];  Service: Orthopedics;  Laterality: Right;    There were no vitals filed for this visit.   Subjective Assessment - 03/23/21 0830     Subjective Pt continues to have significant calf pain today, knee pain is low today,    Currently in Pain? Yes    Pain Score 9     Pain Location Calf    Pain Orientation Right    Pain Descriptors / Indicators Sore;Tightness    Pain Type Acute pain    Pain Onset In the past 7 days    Pain Frequency Constant    Aggravating Factors  weight bearing, bending                [facility] PT Assessment - 03/23/21 0001       Assessment   Medical Diagnosis Right TKR    Referring Provider (PT) French Ana    Next MD Visit 4 weeks                            [facility] Adult PT Treatment/Exercise - 03/23/21 0001       Knee/Hip Exercises: Stretches   Knee: Self-Stretch to increase Flexion 5 reps;10 seconds    Knee: Self-Stretch Limitations knee drive on 85UD step height    Gastroc Stretch Right;3 reps;30 seconds    Gastroc Stretch Limitations slant board      Knee/Hip Exercises: Aerobic   Recumbent Bike 4 minutes full revolution for mobility, seat 16      Knee/Hip Exercises: Standing   Heel Raises Both;2 sets;10 reps    Knee Flexion 10 reps    Terminal Knee Extension 10 reps;Theraband    Theraband Level (Terminal Knee Extension) Level 2 (Red)    Terminal Knee Extension Limitations 5" holds      Knee/Hip Exercises: Seated   Long Arc Quad Right;2 sets;10 reps    Sit to [employer] 2 sets;10 reps;without UE support   eccentric control 20 in height     Knee/Hip Exercises: Supine   Quad Sets 10 reps    Short  Arc [employer] Right;10 reps    Heel Slides AROM;AAROM    Heel Slides Limitations 5" hold    Straight Leg Raises Right;1 set;10 reps    Knee Extension AROM;Right    Knee Extension Limitations 2    Knee Flexion AAROM;Right    Knee Flexion Limitations 112      Manual Therapy   Manual Therapy Edema management;Soft tissue mobilization    Manual therapy comments All manual completed separately from other skilled interventions    Edema Management Decongestive lymph manual wiht LE elevated to address calf swelling and pain    Soft tissue mobilization STM to calf                     PT Education - 03/23/21 0851     Education Details Educated technqiues to assist with donning TED hose    Person(s) Educated Patient    Methods Explanation;Demonstration    Comprehension Verbalized understanding              PT Short Term Goals - 03/12/21 0846       PT SHORT TERM GOAL #1   Title Patient will report understanding and regular compliance with HEP to improve mobility and decrease pain.    Time 3     Period Weeks    Status New    Target Date 04/02/21      PT SHORT TERM GOAL #2   Title Demonstrate right knee extension 0 degrees and flexion 90 degrees to improve gait mechanics    Baseline lacking 15 extension, 70 flexion    Time 3    Period Weeks    Status New    Target Date 04/02/21      PT SHORT TERM GOAL #3   Title Ambulate 300 ft 2MWT with least restrictive AD to improve gait speed    Baseline 185 ft with RW    Time 3    Period Weeks    Status New    Target Date 04/02/21               PT Long Term Goals - 03/12/21 0848       PT LONG TERM GOAL #1   Title Patient will report an improvement of at least 50% in overall symptoms  to improve QOL.    Baseline 9/10 right knee pain    Time 6    Period Weeks    Status New    Target Date 04/23/21      PT LONG TERM GOAL #2   Title Ambulate independently with normalized pattern and stairs with reciprocal pattern    Time 6    Period Weeks    Status New    Target Date 04/23/21                   Plan - 03/23/21 0912     Clinical Impression Statement Session focus wiht edema control for pain control and knee mobility.  Began session with manual retrograde massage to address medial calf edema with reports of pain reduced following.  Added knee drives and TKE for knee mobility and quad strengthening.  AROM 2-112 degrees.    Personal Factors and Comorbidities Profession    Examination-Activity Limitations Bend;Carry;Lift;Toileting;Stairs;Squat;Sleep;Locomotion Level;Transfers    Examination-Participation Restrictions Cleaning;Community Activity;Occupation    Stability/Clinical Decision Making Stable/Uncomplicated    Clinical Decision Making Low    Rehab Potential Excellent    PT Frequency 3x / week    PT Duration  6 weeks    PT Treatment/Interventions ADLs/Self Care Home Management;Electrical Stimulation;DME Instruction;Gait training;Functional mobility training;Therapeutic activities;Therapeutic exercise;Balance  training;Neuromuscular re-education;Patient/family education;Passive range of motion    PT Next Visit Plan continue with acute TKR rehab    PT Home Exercise Plan QS, heel slides (supine and seated) 12/7 bridge, SLR, heel raises    Consulted and Agree with Plan of Care Patient             Patient will benefit from skilled therapeutic intervention in order to improve the following deficits and impairments:  Abnormal gait, Decreased activity tolerance, Decreased mobility, Decreased knowledge of use of DME, Decreased range of motion, Decreased strength, Increased edema, Difficulty walking, Impaired perceived functional ability, Pain  Visit Diagnosis: Acute pain of right knee  Difficulty in walking, not elsewhere classified  Muscle weakness (generalized)     Problem List Patient Active Problem List   Diagnosis Date Noted   CELLULITIS, LEFT HAND 10/16/2009   Ihor Austin, LPTA/CLT; CBIS [phone]  Aldona Lento, PTA 03/23/2021, 10:13 AM  [place], [place], [address] Phone: [phone]   Fax:  [phone]  Name: Riley Larson MRN: [id number] Date of Birth: 04-Dec-1970

## 2021-03-26 ENCOUNTER — Other Ambulatory Visit: Payer: Self-pay

## 2021-03-26 ENCOUNTER — Ambulatory Visit (HOSPITAL_COMMUNITY): Payer: BC Managed Care – PPO | Admitting: Physical Therapy

## 2021-03-26 ENCOUNTER — Encounter (HOSPITAL_COMMUNITY): Payer: Self-pay | Admitting: Physical Therapy

## 2021-03-26 DIAGNOSIS — M25561 Pain in right knee: Secondary | ICD-10-CM

## 2021-03-26 DIAGNOSIS — M6281 Muscle weakness (generalized): Secondary | ICD-10-CM

## 2021-03-26 DIAGNOSIS — R262 Difficulty in walking, not elsewhere classified: Secondary | ICD-10-CM

## 2021-03-26 NOTE — Therapy (Signed)
[place], [place], [address] Phone: [phone]   Fax:  [phone]  Physical Therapy Treatment  Patient Details  Name: Riley Larson MRN: [id number] Date of Birth: September 12, 1970 Referring Provider (PT): French Ana   Encounter Date: 03/26/2021   PT End of Session - 03/26/21 0825     Visit Number 8    Number of Visits 18    Date for PT Re-Evaluation 04/23/21    Authorization Type [employer] Comm PPO    PT Start Time 0821    PT Stop Time 0900    PT Time Calculation (min) 39 min    Activity Tolerance Patient tolerated treatment well    Behavior During Therapy [facility] for tasks assessed/performed             Past Medical History:  Diagnosis Date   Arthritis    Hypertension     Past Surgical History:  Procedure Laterality Date   BACK SURGERY     COLONOSCOPY WITH PROPOFOL N/A 03/14/2020   Procedure: COLONOSCOPY WITH PROPOFOL;  Surgeon: Harvel Quale, MD;  [facility];  Service: Gastroenterology;  Laterality: N/A;  9:15   LAPAROSCOPIC GASTRIC BANDING     left index finger surgery      TOTAL KNEE ARTHROPLASTY Right 03/09/2021   Procedure: TOTAL KNEE ARTHROPLASTY;  Surgeon: Earlie Server, MD;  [facility];  Service: Orthopedics;  Laterality: Right;    There were no vitals filed for this visit.   Subjective Assessment - 03/26/21 0825     Subjective Doing better today. Calf pain not as bad. Still having some swelling.    Currently in Pain? Yes    Pain Score 4     Pain Location Calf    Pain Orientation Right    Pain Descriptors / Indicators Sore    Pain Type Acute pain    Pain Onset In the past 7 days                               [facility] Adult PT Treatment/Exercise - 03/26/21 0001       Knee/Hip Exercises: Stretches   Knee: Self-Stretch to increase Flexion 5 reps;10 seconds    Knee: Self-Stretch Limitations knee drive on 28ZM step height    Gastroc Stretch 3  reps;30 seconds;Both    Gastroc Stretch Limitations slant board      Knee/Hip Exercises: Aerobic   Recumbent Bike 4 minutes full revolution for mobility, seat 16      Knee/Hip Exercises: Machines for Strengthening   Other Machine Machine TKE 3 plates x20      Knee/Hip Exercises: Standing   Heel Raises Both;2 sets;10 reps    Forward Step Up Right;2 sets;Hand Hold: 1;Step Height: 4"    Step Down Right;2 sets;10 reps;Hand Hold: 1;Step Height: 4"    Gait Training 1 RT in clinic with no AD    Other Standing Knee Exercises Tandem stance 2x30 sec left/right      Knee/Hip Exercises: Seated   Sit to Sand 2 sets;10 reps;without UE support      Knee/Hip Exercises: Supine   Heel Slides Right;1 set;10 reps    Knee Extension AROM;Right    Knee Extension Limitations 2    Knee Flexion AAROM;Right    Knee Flexion Limitations 114      Manual Therapy   Manual Therapy Edema management;Soft tissue mobilization    Manual therapy comments  All manual completed separately from other skilled interventions    Edema Management to RT calf with leg elevated for decreased swelling                       PT Short Term Goals - 03/12/21 0846       PT SHORT TERM GOAL #1   Title Patient will report understanding and regular compliance with HEP to improve mobility and decrease pain.    Time 3    Period Weeks    Status New    Target Date 04/02/21      PT SHORT TERM GOAL #2   Title Demonstrate right knee extension 0 degrees and flexion 90 degrees to improve gait mechanics    Baseline lacking 15 extension, 70 flexion    Time 3    Period Weeks    Status New    Target Date 04/02/21      PT SHORT TERM GOAL #3   Title Ambulate 300 ft 2MWT with least restrictive AD to improve gait speed    Baseline 185 ft with RW    Time 3    Period Weeks    Status New    Target Date 04/02/21               PT Long Term Goals - 03/12/21 0848       PT LONG TERM GOAL #1   Title Patient will report  an improvement of at least 50% in overall symptoms  to improve QOL.    Baseline 9/10 right knee pain    Time 6    Period Weeks    Status New    Target Date 04/23/21      PT LONG TERM GOAL #2   Title Ambulate independently with normalized pattern and stairs with reciprocal pattern    Time 6    Period Weeks    Status New    Target Date 04/23/21                   Plan - 03/26/21 0900     Clinical Impression Statement Patient progressing well. Continued restriction in RT calf noted with manual treatment. Improved static balance and gait mechanics. No increased pain during todays session. Progressed to step down for eccentric quad strength. Patient will continue to benefit from skilled therapy services to reduced deficits and improve function.    Personal Factors and Comorbidities Profession    Examination-Activity Limitations Bend;Carry;Lift;Toileting;Stairs;Squat;Sleep;Locomotion Level;Transfers    Examination-Participation Restrictions Cleaning;Community Activity;Occupation    Stability/Clinical Decision Making Stable/Uncomplicated    Rehab Potential Excellent    PT Frequency 3x / week    PT Duration 6 weeks    PT Treatment/Interventions ADLs/Self Care Home Management;Electrical Stimulation;DME Instruction;Gait training;Functional mobility training;Therapeutic activities;Therapeutic exercise;Balance training;Neuromuscular re-education;Patient/family education;Passive range of motion    PT Next Visit Plan continue with acute TKR rehab    PT Home Exercise Plan QS, heel slides (supine and seated) 12/7 bridge, SLR, heel raises 12/19 sit to stand, tandem stance    Consulted and Agree with Plan of Care Patient             Patient will benefit from skilled therapeutic intervention in order to improve the following deficits and impairments:  Abnormal gait, Decreased activity tolerance, Decreased mobility, Decreased knowledge of use of DME, Decreased range of motion, Decreased  strength, Increased edema, Difficulty walking, Impaired perceived functional ability, Pain  Visit Diagnosis: Acute pain of right knee  Difficulty  in walking, not elsewhere classified  Muscle weakness (generalized)     Problem List Patient Active Problem List   Diagnosis Date Noted   CELLULITIS, LEFT HAND 10/16/2009   9:02 AM, 03/26/21 Josue Hector PT DPT  Physical Therapist with [facility]  [phone] [place] [address] [place], [place], [address] Phone: [phone]   Fax:  [phone]  Name: Riley Larson MRN: [id number] Date of Birth: 09/27/1970

## 2021-03-28 ENCOUNTER — Ambulatory Visit (HOSPITAL_COMMUNITY): Payer: BC Managed Care – PPO | Admitting: Physical Therapy

## 2021-03-28 ENCOUNTER — Other Ambulatory Visit: Payer: Self-pay

## 2021-03-28 DIAGNOSIS — M6281 Muscle weakness (generalized): Secondary | ICD-10-CM

## 2021-03-28 DIAGNOSIS — R262 Difficulty in walking, not elsewhere classified: Secondary | ICD-10-CM

## 2021-03-28 DIAGNOSIS — M25561 Pain in right knee: Secondary | ICD-10-CM

## 2021-03-28 NOTE — Therapy (Signed)
[place], [place], [address] Phone: [phone]   Fax:  [phone]  Physical Therapy Treatment  Patient Details  Name: Riley Larson MRN: [id number] Date of Birth: 1970-11-02 Referring Provider (PT): French Ana   Encounter Date: 03/28/2021   PT End of Session - 03/28/21 0826     Visit Number 9    Number of Visits 18    Date for PT Re-Evaluation 04/23/21    Authorization Type [employer] Comm PPO    PT Start Time [phone]   arrives late   PT Stop Time 0904    PT Time Calculation (min) 38 min    Activity Tolerance Patient tolerated treatment well    Behavior During Therapy [facility] for tasks assessed/performed             Past Medical History:  Diagnosis Date   Arthritis    Hypertension     Past Surgical History:  Procedure Laterality Date   BACK SURGERY     COLONOSCOPY WITH PROPOFOL N/A 03/14/2020   Procedure: COLONOSCOPY WITH PROPOFOL;  Surgeon: Harvel Quale, MD;  [facility];  Service: Gastroenterology;  Laterality: N/A;  9:15   LAPAROSCOPIC GASTRIC BANDING     left index finger surgery      TOTAL KNEE ARTHROPLASTY Right 03/09/2021   Procedure: TOTAL KNEE ARTHROPLASTY;  Surgeon: Earlie Server, MD;  [facility];  Service: Orthopedics;  Laterality: Right;    There were no vitals filed for this visit.   Subjective Assessment - 03/28/21 0833     Subjective Patient says he is doing well. Mild soreness in leg and calf. Running late this AM.    Currently in Pain? Yes    Pain Score 4     Pain Location Calf    Pain Orientation Right    Pain Descriptors / Indicators Sore    Pain Type Acute pain    Pain Onset In the past 7 days                               [facility] Adult PT Treatment/Exercise - 03/28/21 0001       Knee/Hip Exercises: Stretches   Knee: Self-Stretch to increase Flexion 5 reps;10 seconds    Knee: Self-Stretch Limitations knee drive on 06YI step  height    Gastroc Stretch 3 reps;30 seconds;Both    Gastroc Stretch Limitations slant board      Knee/Hip Exercises: Aerobic   Recumbent Bike 4 minutes  lv 2 full revolution for mobility, seat 16      Knee/Hip Exercises: Standing   Heel Raises Both;2 sets;10 reps    Stairs 5 RT 4 inch step HHA x 1, reciprocal, 3 RT on 7 inch HHA x 1, reciprical    SLS 3 x 10"    Gait Training 1 RT in clinic with no AD      Knee/Hip Exercises: Seated   Sit to Sand 2 sets;10 reps;without UE support      Knee/Hip Exercises: Supine   Heel Slides Right;1 set;10 reps    Knee Extension AROM;Right    Knee Extension Limitations 2    Knee Flexion Right;AROM    Knee Flexion Limitations 116      Manual Therapy   Manual Therapy Edema management;Soft tissue mobilization    Manual therapy comments All manual completed separately from other skilled interventions    Edema Management to RT calf  with leg elevated for decreased swelling    Soft tissue mobilization STM to calf                       PT Short Term Goals - 03/12/21 0846       PT SHORT TERM GOAL #1   Title Patient will report understanding and regular compliance with HEP to improve mobility and decrease pain.    Time 3    Period Weeks    Status New    Target Date 04/02/21      PT SHORT TERM GOAL #2   Title Demonstrate right knee extension 0 degrees and flexion 90 degrees to improve gait mechanics    Baseline lacking 15 extension, 70 flexion    Time 3    Period Weeks    Status New    Target Date 04/02/21      PT SHORT TERM GOAL #3   Title Ambulate 300 ft 2MWT with least restrictive AD to improve gait speed    Baseline 185 ft with RW    Time 3    Period Weeks    Status New    Target Date 04/02/21               PT Long Term Goals - 03/12/21 0848       PT LONG TERM GOAL #1   Title Patient will report an improvement of at least 50% in overall symptoms  to improve QOL.    Baseline 9/10 right knee pain    Time 6     Period Weeks    Status New    Target Date 04/23/21      PT LONG TERM GOAL #2   Title Ambulate independently with normalized pattern and stairs with reciprocal pattern    Time 6    Period Weeks    Status New    Target Date 04/23/21                   Plan - 03/28/21 0907     Clinical Impression Statement Patient shows continued improvement in functional ability. Able to progress to 7-inch steps with no increased complaint of pain. Some eccentric weakness noted with descending steps. Continued limitation in knee extension AROM noted. Patient reports decreased pain and stiffness following manual treatment. Patient will continue to benefit from skilled therapy services to reduce deficits and improve functional ability.    Personal Factors and Comorbidities Profession    Examination-Activity Limitations Bend;Carry;Lift;Toileting;Stairs;Squat;Sleep;Locomotion Level;Transfers    Examination-Participation Restrictions Cleaning;Community Activity;Occupation    Stability/Clinical Decision Making Stable/Uncomplicated    Rehab Potential Excellent    PT Frequency 3x / week    PT Duration 6 weeks    PT Treatment/Interventions ADLs/Self Care Home Management;Electrical Stimulation;DME Instruction;Gait training;Functional mobility training;Therapeutic activities;Therapeutic exercise;Balance training;Neuromuscular re-education;Patient/family education;Passive range of motion    PT Next Visit Plan continue with acute TKR rehab    PT Home Exercise Plan QS, heel slides (supine and seated) 12/7 bridge, SLR, heel raises 12/19 sit to stand, tandem stance    Consulted and Agree with Plan of Care Patient             Patient will benefit from skilled therapeutic intervention in order to improve the following deficits and impairments:  Abnormal gait, Decreased activity tolerance, Decreased mobility, Decreased knowledge of use of DME, Decreased range of motion, Decreased strength, Increased edema,  Difficulty walking, Impaired perceived functional ability, Pain  Visit Diagnosis: Acute pain of  right knee  Difficulty in walking, not elsewhere classified  Muscle weakness (generalized)     Problem List Patient Active Problem List   Diagnosis Date Noted   CELLULITIS, LEFT HAND 10/16/2009   9:08 AM, 03/28/21 Josue Hector PT DPT  Physical Therapist with [facility]  [phone] [place], [place], [address] Phone: [phone]   Fax:  [phone]  Name: Riley Larson MRN: [id number] Date of Birth: 12/18/1970

## 2021-03-30 ENCOUNTER — Ambulatory Visit (HOSPITAL_COMMUNITY): Payer: BC Managed Care – PPO

## 2021-04-03 ENCOUNTER — Other Ambulatory Visit: Payer: Self-pay

## 2021-04-03 ENCOUNTER — Ambulatory Visit (HOSPITAL_COMMUNITY): Payer: BC Managed Care – PPO

## 2021-04-03 DIAGNOSIS — M25561 Pain in right knee: Secondary | ICD-10-CM | POA: Diagnosis not present

## 2021-04-03 DIAGNOSIS — R262 Difficulty in walking, not elsewhere classified: Secondary | ICD-10-CM

## 2021-04-03 NOTE — Therapy (Signed)
[place] [address] [place], [place], [address] Phone: [phone]   Fax:  [phone]  Physical Therapy Treatment and Progress Note  Patient Details  Name: Riley Larson MRN: [id number] Date of Birth: December 23, 1970 Referring Provider (PT): Caffrey  Progress Note Reporting Period 03/12/21 to 04/03/21  See note below for Objective Data and Assessment of Progress/Goals.     Encounter Date: 04/03/2021   PT End of Session - 04/03/21 0818     Visit Number 10    Number of Visits 18    Date for PT Re-Evaluation 04/23/21    Authorization Type [employer]BS Comm PPO    Progress Note Due on Visit 20    PT Start Time 0815    PT Stop Time 0900    PT Time Calculation (min) 45 min    Activity Tolerance Patient tolerated treatment well    Behavior During Therapy WFL for tasks assessed/performed             Past Medical History:  Diagnosis Date   Arthritis    Hypertension     Past Surgical History:  Procedure Laterality Date   BACK SURGERY     COLONOSCOPY WITH PROPOFOL N/A 03/14/2020   Procedure: COLONOSCOPY WITH PROPOFOL;  Surgeon: Harvel Quale, MD;  [facility];  Service: Gastroenterology;  Laterality: N/A;  9:15   LAPAROSCOPIC GASTRIC BANDING     left index finger surgery      TOTAL KNEE ARTHROPLASTY Right 03/09/2021   Procedure: TOTAL KNEE ARTHROPLASTY;  Surgeon: Earlie Server, MD;  [facility];  Service: Orthopedics;  Laterality: Right;    There were no vitals filed for this visit.   Subjective Assessment - 04/03/21 0819     Subjective Overall feeling better, it seems the swelling has moved from the claf up to the thigh    Currently in Pain? Yes    Pain Score 4     Pain Location Knee    Pain Orientation Right    Pain Descriptors / Indicators Sore    Pain Type Acute pain    Pain Onset In the past 7 days                [facility] PT Assessment - 04/03/21 0001       Assessment   Medical  Diagnosis Right TKR    Referring Provider (PT) French Ana                           [facility] Adult PT Treatment/Exercise - 04/03/21 0001       Ambulation/Gait   Ambulation/Gait Yes    Ambulation/Gait Assistance 7: Independent    Ambulation Distance (Feet) 390 Feet    Gait Pattern Within Functional Limits    Stairs Yes    Stairs Assistance 6: Modified independent (Device/Increase time)    Stair Management Technique Alternating pattern    Gait Comments 2MWT. Deviations with descending stairs, turns sideways, poor eccentric control      Knee/Hip Exercises: Aerobic   Recumbent Bike 5 minutes level 2 for dynamic warm-up and ROM      Knee/Hip Exercises: Standing   Knee Flexion Strengthening;Right;2 sets;10 reps    Knee Flexion Limitations 10#    Lateral Step Up Right;2 sets;10 reps;Step Height: 4";Hand Hold: 2    Forward Step Up Right;2 sets;Hand Hold: 1;Step Height: 4"    Forward Step Up Limitations cues for glute recruitment    Step  Down Right;2 sets;10 reps;Step Height: 2";Hand Hold: 2    Step Down Limitations poor eccentric control    Other Standing Knee Exercises sidestepping x 2 min 10# RLE      Knee/Hip Exercises: Seated   Long Arc Quad Strengthening;Right;3 sets;10 reps    Long Arc Quad Weight 10 lbs.    Long Arc Quad Limitations 3 sec hold at end-range      Knee/Hip Exercises: Supine   Knee Extension AROM;Right    Knee Extension Limitations 1-2   lacking   Knee Flexion Right;AROM    Knee Flexion Limitations 117      Knee/Hip Exercises: Prone   Hamstring Curl 2 sets;10 reps    Hamstring Curl Limitations 10#                       PT Short Term Goals - 04/03/21 0841       PT SHORT TERM GOAL #1   Title Patient will report understanding and regular compliance with HEP to improve mobility and decrease pain.    Baseline pt reports he is only doing heel slides for ROM, emphasized importance of strength development    Time 3    Period Weeks     Status On-going    Target Date 04/02/21      PT SHORT TERM GOAL #2   Title Demonstrate right knee extension 0 degrees and flexion 90 degrees to improve gait mechanics    Baseline lacking 1-2 degrees extension, 117 flexion    Time 3    Period Weeks    Status Achieved    Target Date 04/02/21      PT SHORT TERM GOAL #3   Title Ambulate 300 ft 2MWT with least restrictive AD to improve gait speed    Baseline 185 ft with RW at start of care, 390 ft with independence presently    Time 3    Period Weeks    Status Achieved    Target Date 04/02/21               PT Long Term Goals - 04/03/21 7353       PT LONG TERM GOAL #1   Title Patient will report an improvement of at least 50% in overall symptoms  to improve QOL.    Baseline 50% improvement    Time 6    Period Weeks    Status Achieved    Target Date 04/23/21      PT LONG TERM GOAL #2   Title Ambulate independently with normalized pattern and stairs with reciprocal pattern    Baseline deviations with stair ambulation    Time 6    Period Weeks    Status On-going    Target Date 04/23/21                   Plan - 04/03/21 0845     Clinical Impression Statement Progressing with POC details and demonstrating improved right knee ROM for extension and flexion.  Improving strength evident by use of 10 lbs weight for [place] PRE.  Able to ambulate independently level ground with minimal gait deviation. More deviation noted with stair ambulation with difficulty descending with poor eccentric right quad strength evident. Met 2/3 STG and 1/2 LTG at this time. Continued POC indicated to progress RLE ROM and strength and normalize gait pattern    Personal Factors and Comorbidities Profession    Examination-Activity Limitations Bend;Carry;Lift;Toileting;Stairs;Squat;Sleep;Locomotion Level;Transfers    Examination-Participation Restrictions Cleaning;Community  Activity;Occupation    Stability/Clinical Decision Making  Stable/Uncomplicated    Rehab Potential Excellent    PT Frequency 3x / week    PT Duration 6 weeks    PT Treatment/Interventions ADLs/Self Care Home Management;Electrical Stimulation;DME Instruction;Gait training;Functional mobility training;Therapeutic activities;Therapeutic exercise;Balance training;Neuromuscular re-education;Patient/family education;Passive range of motion    PT Next Visit Plan continue with acute TKR rehab    PT Home Exercise Plan QS, heel slides (supine and seated) 12/7 bridge, SLR, heel raises 12/19 sit to stand, tandem stance    Consulted and Agree with Plan of Care Patient             Patient will benefit from skilled therapeutic intervention in order to improve the following deficits and impairments:  Abnormal gait, Decreased activity tolerance, Decreased mobility, Decreased knowledge of use of DME, Decreased range of motion, Decreased strength, Increased edema, Difficulty walking, Impaired perceived functional ability, Pain  Visit Diagnosis: Acute pain of right knee  Difficulty in walking, not elsewhere classified     Problem List Patient Active Problem List   Diagnosis Date Noted   CELLULITIS, LEFT HAND 10/16/2009    Toniann Fail, PT 04/03/2021, 8:51 AM  [place], [place], [address] Phone: [phone]   Fax:  [phone]  Name: Riley Larson MRN: [id number] Date of Birth: [birth year]

## 2021-04-04 ENCOUNTER — Encounter (HOSPITAL_COMMUNITY): Payer: Self-pay

## 2021-04-04 ENCOUNTER — Ambulatory Visit (HOSPITAL_COMMUNITY): Payer: BC Managed Care – PPO

## 2021-04-04 DIAGNOSIS — M6281 Muscle weakness (generalized): Secondary | ICD-10-CM

## 2021-04-04 DIAGNOSIS — R262 Difficulty in walking, not elsewhere classified: Secondary | ICD-10-CM

## 2021-04-04 DIAGNOSIS — M25561 Pain in right knee: Secondary | ICD-10-CM

## 2021-04-04 NOTE — Therapy (Signed)
Riley Larson, [place], [address] Phone: [phone]   Fax:  [phone]  Physical Therapy Treatment  Patient Details  Name: Riley Larson MRN: [id number] Date of Birth: 25-May-1970 Referring Provider (PT): French Ana   Encounter Date: 04/04/2021   PT End of Session - 04/04/21 0836     Visit Number 11    Number of Visits 18    Date for PT Re-Evaluation 04/23/21    Authorization Type BCBS Comm PPO    Progress Note Due on Visit 20    PT Start Time 0820    PT Stop Time 0905    PT Time Calculation (min) 45 min    Activity Tolerance Patient tolerated treatment well    Behavior During Therapy [facility] for tasks assessed/performed             Past Medical History:  Diagnosis Date   Arthritis    Hypertension     Past Surgical History:  Procedure Laterality Date   BACK SURGERY     COLONOSCOPY WITH PROPOFOL N/A 03/14/2020   Procedure: COLONOSCOPY WITH PROPOFOL;  Surgeon: Harvel Quale, MD;  [facility];  Service: Gastroenterology;  Laterality: N/A;  9:15   LAPAROSCOPIC GASTRIC BANDING     left index finger surgery      TOTAL KNEE ARTHROPLASTY Right 03/09/2021   Procedure: TOTAL KNEE ARTHROPLASTY;  Surgeon: Earlie Server, MD;  Location: [facility];  Service: Orthopedics;  Laterality: Right;    There were no vitals filed for this visit.   Subjective Assessment - 04/04/21 0824     Subjective Pt stated he is sore today, pain scale 5-6/10.    Currently in Pain? Yes    Pain Score 6     Pain Location Knee    Pain Orientation Right    Pain Descriptors / Indicators Sore    Pain Type Acute pain    Pain Onset In the past 7 days    Pain Frequency Constant    Aggravating Factors  weight bearing, bending                               [facility] Adult PT Treatment/Exercise - 04/04/21 0001       Exercises   Exercises Knee/Hip      Knee/Hip Exercises: Stretches   Quad Stretch 3  reps;30 seconds    Quad Stretch Limitations prone with rope      Knee/Hip Exercises: Aerobic   Recumbent Bike 5 minutes level 2 for dynamic warm-up and ROM      Knee/Hip Exercises: Machines for Strengthening   Other Machine Retro gait with 3pl (toe to heel retro then controlled heel to toe forward) 5RT      Knee/Hip Exercises: Standing   Knee Flexion Strengthening;Right;2 sets;10 reps    Knee Flexion Limitations 10#    Forward Lunges 15 reps;Both    Forward Lunges Limitations 4in step, no HHA    Lateral Step Up Right;2 sets;10 reps;Step Height: 4";Hand Hold: 2    Forward Step Up 2 sets;10 reps;Hand Hold: 1;Hand Hold: 0;Step Height: 4";Step Height: 6"    Step Down Right;2 sets;10 reps;Step Height: 2";Hand Hold: 2;Step Height: 4"    Step Down Limitations visual quad fatigue    Functional Squat 10 reps    Functional Squat Limitations gobler    Other Standing Knee Exercises sidestepping x 2 min 10# RLE  Knee/Hip Exercises: Seated   Long Arc Quad Strengthening;Right;3 sets;10 reps    Long Arc Quad Weight 10 lbs.    Long Arc Quad Limitations 3 sec hold at end-range      Knee/Hip Exercises: Supine   Knee Extension AROM;Right    Knee Extension Limitations 1-2    Knee Flexion Right;AROM    Knee Flexion Limitations 117                       PT Short Term Goals - 04/03/21 0841       PT SHORT TERM GOAL #1   Title Patient will report understanding and regular compliance with HEP to improve mobility and decrease pain.    Baseline pt reports he is only doing heel slides for ROM, emphasized importance of strength development    Time 3    Period Weeks    Status On-going    Target Date 04/02/21      PT SHORT TERM GOAL #2   Title Demonstrate right knee extension 0 degrees and flexion 90 degrees to improve gait mechanics    Baseline lacking 1-2 degrees extension, 117 flexion    Time 3    Period Weeks    Status Achieved    Target Date 04/02/21      PT SHORT TERM GOAL  #3   Title Ambulate 300 ft 2MWT with least restrictive AD to improve gait speed    Baseline 185 ft with RW at start of care, 390 ft with independence presently    Time 3    Period Weeks    Status Achieved    Target Date 04/02/21               PT Long Term Goals - 04/03/21 5643       PT LONG TERM GOAL #1   Title Patient will report an improvement of at least 50% in overall symptoms  to improve QOL.    Baseline 50% improvement    Time 6    Period Weeks    Status Achieved    Target Date 04/23/21      PT LONG TERM GOAL #2   Title Ambulate independently with normalized pattern and stairs with reciprocal pattern    Baseline deviations with stair ambulation    Time 6    Period Weeks    Status On-going    Target Date 04/23/21                   Plan - 04/04/21 0913     Clinical Impression Statement Pt progressing well towards POC.  Added squats and lunges to POC for functional strengthening with min cueing for proper form initially.  Visual quad fatigue with step up training, able to increase height with step ups with good control.  Continues to demonstrate poor eccentric control with step down due to weakness.  No reports of pain through session.    Personal Factors and Comorbidities Profession    Examination-Activity Limitations Bend;Carry;Lift;Toileting;Stairs;Squat;Sleep;Locomotion Level;Transfers    Examination-Participation Restrictions Cleaning;Community Activity;Occupation    Stability/Clinical Decision Making Stable/Uncomplicated    Clinical Decision Making Low    Rehab Potential Excellent    PT Frequency 3x / week    PT Duration 6 weeks    PT Treatment/Interventions ADLs/Self Care Home Management;Electrical Stimulation;DME Instruction;Gait training;Functional mobility training;Therapeutic activities;Therapeutic exercise;Balance training;Neuromuscular re-education;Patient/family education;Passive range of motion    PT Next Visit Plan continue with acute TKR  rehab    PT  Home Exercise Plan QS, heel slides (supine and seated) 12/7 bridge, SLR, heel raises 12/19 sit to stand, tandem stance    Consulted and Agree with Plan of Care Patient             Patient will benefit from skilled therapeutic intervention in order to improve the following deficits and impairments:  Abnormal gait, Decreased activity tolerance, Decreased mobility, Decreased knowledge of use of DME, Decreased range of motion, Decreased strength, Increased edema, Difficulty walking, Impaired perceived functional ability, Pain  Visit Diagnosis: Acute pain of right knee  Difficulty in walking, not elsewhere classified  Muscle weakness (generalized)     Problem List Patient Active Problem List   Diagnosis Date Noted   CELLULITIS, LEFT HAND 10/16/2009   Ihor Austin, LPTA/CLT; CBIS [id number]  Aldona Lento, PTA 04/04/2021, 9:18 AM  [place], [place], [address] Phone: [phone]   Fax:  [phone]  Name: Riley Larson MRN: [id number] Date of Birth: [birth year]

## 2021-04-06 ENCOUNTER — Other Ambulatory Visit: Payer: Self-pay

## 2021-04-06 ENCOUNTER — Ambulatory Visit (HOSPITAL_COMMUNITY): Payer: BC Managed Care – PPO

## 2021-04-06 DIAGNOSIS — R262 Difficulty in walking, not elsewhere classified: Secondary | ICD-10-CM

## 2021-04-06 DIAGNOSIS — M6281 Muscle weakness (generalized): Secondary | ICD-10-CM

## 2021-04-06 DIAGNOSIS — M25561 Pain in right knee: Secondary | ICD-10-CM | POA: Diagnosis not present

## 2021-04-06 NOTE — Therapy (Signed)
[place], [place], [address] Phone: [phone]   Fax:  [phone]  Physical Therapy Treatment  Patient Details  Name: Riley Larson MRN: [id number] Date of Birth: 1971-03-03 Referring Provider (PT): French Ana   Encounter Date: 04/06/2021   PT End of Session - 04/06/21 0824     Visit Number 12    Number of Visits 18    Date for PT Re-Evaluation 04/23/21    Authorization Type BCBS Comm PPO    Progress Note Due on Visit 20    PT Start Time 0817    PT Stop Time 0900    PT Time Calculation (min) 43 min    Activity Tolerance Patient tolerated treatment well    Behavior During Therapy [facility] for tasks assessed/performed             Past Medical History:  Diagnosis Date   Arthritis    Hypertension     Past Surgical History:  Procedure Laterality Date   BACK SURGERY     COLONOSCOPY WITH PROPOFOL N/A 03/14/2020   Procedure: COLONOSCOPY WITH PROPOFOL;  Surgeon: Harvel Quale, MD;  [facility];  Service: Gastroenterology;  Laterality: N/A;  9:15   LAPAROSCOPIC GASTRIC BANDING     left index finger surgery      TOTAL KNEE ARTHROPLASTY Right 03/09/2021   Procedure: TOTAL KNEE ARTHROPLASTY;  Surgeon: Earlie Server, MD;  [facility];  Service: Orthopedics;  Laterality: Right;    There were no vitals filed for this visit.   Subjective Assessment - 04/06/21 0829     Subjective Overall feeling better    Currently in Pain? Yes    Pain Score 4     Pain Location Knee    Pain Orientation Right    Pain Descriptors / Indicators Sore    Pain Type Acute pain    Pain Onset In the past 7 days                               [facility] Adult PT Treatment/Exercise - 04/06/21 0001       Knee/Hip Exercises: Stretches   Knee: Self-Stretch to increase Flexion Right;5 reps;10 seconds      Knee/Hip Exercises: Aerobic   Recumbent Bike 5 minutes level 2 for dynamic warm-up and  ROM      Knee/Hip Exercises: Standing   Knee Flexion Strengthening;Right;3 sets;10 reps    Knee Flexion Limitations 5#    Hip Flexion Stengthening;Right;3 sets;10 reps;Knee bent    Hip Flexion Limitations 12" step    Lateral Step Up Right;2 sets;10 reps;Hand Hold: 1;Step Height: 8"    Forward Step Up Right;2 sets;10 reps;Hand Hold: 1;Step Height: 8"    Walking with Sports Cord resisted retro-forward 4 plates 2x2 min    Other Standing Knee Exercises sidestepping x 2 min 5#      Knee/Hip Exercises: Seated   Long Arc Quad Strengthening;Right;3 sets;10 reps    Long Arc Quad Weight 5 lbs.                       PT Short Term Goals - 04/03/21 0841       PT SHORT TERM GOAL #1   Title Patient will report understanding and regular compliance with HEP to improve mobility and decrease pain.    Baseline pt reports he is only doing heel slides for ROM, emphasized importance  of strength development    Time 3    Period Weeks    Status On-going    Target Date 04/02/21      PT SHORT TERM GOAL #2   Title Demonstrate right knee extension 0 degrees and flexion 90 degrees to improve gait mechanics    Baseline lacking 1-2 degrees extension, 117 flexion    Time 3    Period Weeks    Status Achieved    Target Date 04/02/21      PT SHORT TERM GOAL #3   Title Ambulate 300 ft 2MWT with least restrictive AD to improve gait speed    Baseline 185 ft with RW at start of care, 390 ft with independence presently    Time 3    Period Weeks    Status Achieved    Target Date 04/02/21               PT Long Term Goals - 04/03/21 2951       PT LONG TERM GOAL #1   Title Patient will report an improvement of at least 50% in overall symptoms  to improve QOL.    Baseline 50% improvement    Time 6    Period Weeks    Status Achieved    Target Date 04/23/21      PT LONG TERM GOAL #2   Title Ambulate independently with normalized pattern and stairs with reciprocal pattern    Baseline  deviations with stair ambulation    Time 6    Period Weeks    Status On-going    Target Date 04/23/21                   Plan - 04/06/21 0856     Clinical Impression Statement Progressing with right quad strength with improved power and strength evident by improved stepping up onto high step without trunk flexion to compensate and improving eccentric control for lowering down step albeit with some fascilculation from continued weakness. Contiuned sessions to improve right knee ROM and strength to normalize gait pattern and prepare for return to work    Personal Factors and Comorbidities Profession    Examination-Activity Limitations Bend;Carry;Lift;Toileting;Stairs;Squat;Sleep;Locomotion Level;Transfers    Examination-Participation Restrictions Cleaning;Community Activity;Occupation    Stability/Clinical Decision Making Stable/Uncomplicated    Rehab Potential Excellent    PT Frequency 3x / week    PT Duration 6 weeks    PT Treatment/Interventions ADLs/Self Care Home Management;Electrical Stimulation;DME Instruction;Gait training;Functional mobility training;Therapeutic activities;Therapeutic exercise;Balance training;Neuromuscular re-education;Patient/family education;Passive range of motion    PT Next Visit Plan continue with acute TKR rehab    PT Home Exercise Plan QS, heel slides (supine and seated) 12/7 bridge, SLR, heel raises 12/19 sit to stand, tandem stance    Consulted and Agree with Plan of Care Patient             Patient will benefit from skilled therapeutic intervention in order to improve the following deficits and impairments:  Abnormal gait, Decreased activity tolerance, Decreased mobility, Decreased knowledge of use of DME, Decreased range of motion, Decreased strength, Increased edema, Difficulty walking, Impaired perceived functional ability, Pain  Visit Diagnosis: Acute pain of right knee  Difficulty in walking, not elsewhere classified  Muscle weakness  (generalized)     Problem List Patient Active Problem List   Diagnosis Date Noted   CELLULITIS, LEFT HAND 10/16/2009    Toniann Fail, PT 04/06/2021, 8:59 AM  [place]  [place], [place], [address] Phone: [phone]   Fax:  [phone]  Name: Riley Larson MRN: [id number] Date of Birth: Aug 05, 1970

## 2021-04-10 ENCOUNTER — Encounter (HOSPITAL_COMMUNITY): Payer: Self-pay | Admitting: Physical Therapy

## 2021-04-10 ENCOUNTER — Other Ambulatory Visit: Payer: Self-pay

## 2021-04-10 ENCOUNTER — Ambulatory Visit (HOSPITAL_COMMUNITY): Payer: BC Managed Care – PPO | Attending: Orthopedic Surgery | Admitting: Physical Therapy

## 2021-04-10 DIAGNOSIS — M25561 Pain in right knee: Secondary | ICD-10-CM | POA: Insufficient documentation

## 2021-04-10 DIAGNOSIS — M6281 Muscle weakness (generalized): Secondary | ICD-10-CM | POA: Insufficient documentation

## 2021-04-10 DIAGNOSIS — R262 Difficulty in walking, not elsewhere classified: Secondary | ICD-10-CM | POA: Insufficient documentation

## 2021-04-10 NOTE — Patient Instructions (Signed)
Access Code: [email or web address] URL: [email or web address] Date: 04/10/2021 Prepared by: Josue Hector  Exercises Squat with Chair Touch - 2 x daily - 7 x weekly - 2 sets - 10 reps Standing Hip Abduction - 2 x daily - 7 x weekly - 2 sets - 10 reps Standing Hip Extension - 2 x daily - 7 x weekly - 2 sets - 10 reps Forward Step Up - 2 x daily - 7 x weekly - 2 sets - 10 reps

## 2021-04-10 NOTE — Therapy (Signed)
[place], [place], [address] Phone: [phone]   Fax:  [phone]  Physical Therapy Treatment  Patient Details  Name: Riley Larson MRN: [id number] Date of Birth: 1970-07-08 Referring Provider (PT): French Ana   Encounter Date: 04/10/2021   PT End of Session - 04/10/21 0823     Visit Number 13    Number of Visits 18    Date for PT Re-Evaluation 04/23/21    Authorization Type BCBS Comm PPO    Progress Note Due on Visit 20    PT Start Time 0818    PT Stop Time 0856    PT Time Calculation (min) 38 min    Activity Tolerance Patient tolerated treatment well    Behavior During Therapy [facility] for tasks assessed/performed             Past Medical History:  Diagnosis Date   Arthritis    Hypertension     Past Surgical History:  Procedure Laterality Date   BACK SURGERY     COLONOSCOPY WITH PROPOFOL N/A 03/14/2020   Procedure: COLONOSCOPY WITH PROPOFOL;  Surgeon: Harvel Quale, MD;  [facility];  Service: Gastroenterology;  Laterality: N/A;  9:15   LAPAROSCOPIC GASTRIC BANDING     left index finger surgery      TOTAL KNEE ARTHROPLASTY Right 03/09/2021   Procedure: TOTAL KNEE ARTHROPLASTY;  Surgeon: Earlie Server, MD;  [facility];  Service: Orthopedics;  Laterality: Right;    There were no vitals filed for this visit.   Subjective Assessment - 04/10/21 0823     Subjective Patient says he is doing good. A little stiff this morning.    Currently in Pain? Yes    Pain Score 4     Pain Location Knee    Pain Orientation Right    Pain Descriptors / Indicators Sore    Pain Type Surgical pain    Pain Onset In the past 7 days    Pain Frequency Constant                               [facility] Adult PT Treatment/Exercise - 04/10/21 0001       Knee/Hip Exercises: Stretches   Gastroc Stretch 3 reps;30 seconds;Both    Gastroc Stretch Limitations slant board     Other Knee/Hip Stretches Knee driver 12 inch box 5 x10"      Knee/Hip Exercises: Aerobic   Recumbent Bike 5 minutes level 2 for dynamic warm-up and ROM      Knee/Hip Exercises: Machines for Strengthening   Other Machine machine TKE 4 plates x20, machine walkouts 5 plates x 10      Knee/Hip Exercises: Standing   Heel Raises Both;2 sets;10 reps    Functional Squat 2 sets;10 reps    Functional Squat Limitations goblet 5lb    Stairs 5 RT 7 inch step HHA x 1, reciprocal    SLS 2 x 15" solid, 2 x 15" on foam    Gait Training 2RT in clinic no AD, cues for stride length      Knee/Hip Exercises: Supine   Quad Sets 10 reps    Heel Slides Right;1 set;10 reps    Knee Extension AROM;Right    Knee Extension Limitations 2    Knee Flexion Right;AROM    Knee Flexion Limitations 118  PT Short Term Goals - 04/03/21 0841       PT SHORT TERM GOAL #1   Title Patient will report understanding and regular compliance with HEP to improve mobility and decrease pain.    Baseline pt reports he is only doing heel slides for ROM, emphasized importance of strength development    Time 3    Period Weeks    Status On-going    Target Date 04/02/21      PT SHORT TERM GOAL #2   Title Demonstrate right knee extension 0 degrees and flexion 90 degrees to improve gait mechanics    Baseline lacking 1-2 degrees extension, 117 flexion    Time 3    Period Weeks    Status Achieved    Target Date 04/02/21      PT SHORT TERM GOAL #3   Title Ambulate 300 ft 2MWT with least restrictive AD to improve gait speed    Baseline 185 ft with RW at start of care, 390 ft with independence presently    Time 3    Period Weeks    Status Achieved    Target Date 04/02/21               PT Long Term Goals - 04/03/21 4332       PT LONG TERM GOAL #1   Title Patient will report an improvement of at least 50% in overall symptoms  to improve QOL.    Baseline 50% improvement    Time 6     Period Weeks    Status Achieved    Target Date 04/23/21      PT LONG TERM GOAL #2   Title Ambulate independently with normalized pattern and stairs with reciprocal pattern    Baseline deviations with stair ambulation    Time 6    Period Weeks    Status On-going    Target Date 04/23/21                   Plan - 04/10/21 0854     Clinical Impression Statement Patient progressing well. Shows improved static balance, and is ambulating well with no AD. Minor AROM deficits remain and difficulty with eccentric quad control when descending stairs. Educated patient on updating HEP for emphasis on strengthening and issued handout. Patient will continue to benefit from skilled therapy services to reduce deficits and improve function.    Personal Factors and Comorbidities Profession    Examination-Activity Limitations Bend;Carry;Lift;Toileting;Stairs;Squat;Sleep;Locomotion Level;Transfers    Examination-Participation Restrictions Cleaning;Community Activity;Occupation    Stability/Clinical Decision Making Stable/Uncomplicated    Rehab Potential Excellent    PT Frequency 3x / week    PT Duration 6 weeks    PT Treatment/Interventions ADLs/Self Care Home Management;Electrical Stimulation;DME Instruction;Gait training;Functional mobility training;Therapeutic activities;Therapeutic exercise;Balance training;Neuromuscular re-education;Patient/family education;Passive range of motion    PT Next Visit Plan continue with acute TKR rehab    PT Home Exercise Plan QS, heel slides (supine and seated) 12/7 bridge, SLR, heel raises 12/19 sit to stand, tandem stance 1/3 squat, hip abduction, hip extension, step ups    Consulted and Agree with Plan of Care Patient             Patient will benefit from skilled therapeutic intervention in order to improve the following deficits and impairments:  Abnormal gait, Decreased activity tolerance, Decreased mobility, Decreased knowledge of use of DME, Decreased  range of motion, Decreased strength, Increased edema, Difficulty walking, Impaired perceived functional ability, Pain  Visit Diagnosis: Acute  pain of right knee  Difficulty in walking, not elsewhere classified  Muscle weakness (generalized)     Problem List Patient Active Problem List   Diagnosis Date Noted   CELLULITIS, LEFT HAND 10/16/2009   8:58 AM, 04/10/21 Josue Hector PT DPT  Physical Therapist with [facility]  [phone] [place], [place], [address] Phone: [phone]   Fax:  [phone]  Name: Riley Larson MRN: [id number] Date of Birth: 03-Sep-1970

## 2021-04-11 ENCOUNTER — Ambulatory Visit (HOSPITAL_COMMUNITY): Payer: BC Managed Care – PPO | Admitting: Physical Therapy

## 2021-04-13 ENCOUNTER — Other Ambulatory Visit: Payer: Self-pay

## 2021-04-13 ENCOUNTER — Encounter (HOSPITAL_COMMUNITY): Payer: Self-pay

## 2021-04-13 ENCOUNTER — Ambulatory Visit (HOSPITAL_COMMUNITY): Payer: BC Managed Care – PPO

## 2021-04-13 DIAGNOSIS — M6281 Muscle weakness (generalized): Secondary | ICD-10-CM

## 2021-04-13 DIAGNOSIS — M25561 Pain in right knee: Secondary | ICD-10-CM

## 2021-04-13 DIAGNOSIS — R262 Difficulty in walking, not elsewhere classified: Secondary | ICD-10-CM

## 2021-04-13 NOTE — Patient Instructions (Addendum)
Hip Extension    Lie on back, legs in air, knees bent. Grasp hands behind one thigh and cross other leg over same thigh.  Hold 30 seconds. Repeat with other leg held. Repeat 3 times. Do 2 sessions per day.  Copyright  VHI. All rights reserved.    Piriformis Stretch, Sitting    Sit, one ankle on opposite knee, same-side hand on crossed knee. Push down on knee, keeping spine straight. Lean torso forward, with flat back, until tension is felt in hamstrings and gluteals of crossed-leg side. Hold 30 seconds.  Repeat 3 times per session. Do 2 sessions per day.  Copyright  VHI. All rights reserved.

## 2021-04-13 NOTE — Therapy (Signed)
[place], [place], [address] Phone: [phone]   Fax:  [phone]  Physical Therapy Treatment  Patient Details  Name: Riley Larson MRN: [id number] Date of Birth: 11-Nov-1970 Referring Provider (PT): French Ana   Encounter Date: 04/13/2021   PT End of Session - 04/13/21 0924     Visit Number 14    Number of Visits 18    Date for PT Re-Evaluation 04/23/21    Authorization Type [employer] PPO    Progress Note Due on Visit 20    PT Start Time 0920    PT Stop Time 0958    PT Time Calculation (min) 38 min    Activity Tolerance Patient tolerated treatment well    Behavior During Therapy [facility] for tasks assessed/performed             Past Medical History:  Diagnosis Date   Arthritis    Hypertension     Past Surgical History:  Procedure Laterality Date   BACK SURGERY     COLONOSCOPY WITH PROPOFOL N/A 03/14/2020   Procedure: COLONOSCOPY WITH PROPOFOL;  Surgeon: Harvel Quale, MD;  [facility];  Service: Gastroenterology;  Laterality: N/A;  9:15   LAPAROSCOPIC GASTRIC BANDING     left index finger surgery      TOTAL KNEE ARTHROPLASTY Right 03/09/2021   Procedure: TOTAL KNEE ARTHROPLASTY;  Surgeon: Earlie Server, MD;  Location: [facility];  Service: Orthopedics;  Laterality: Right;    There were no vitals filed for this visit.   Subjective Assessment - 04/13/21 0923     Subjective Reports increased soreness at night and difficulty putting on socks.    Currently in Pain? Yes    Pain Score 3     Pain Location Knee    Pain Orientation Right    Pain Descriptors / Indicators Sore    Pain Type Surgical pain    Pain Onset In the past 7 days    Pain Frequency Intermittent    Aggravating Factors  weight bearing, bending, laying still at night                [facility] PT Assessment - 04/13/21 0001       Assessment   Medical Diagnosis Right TKR    Referring Provider (PT) Caffrey     Onset Date/Surgical Date 03/09/21    Next MD Visit 04/17/21      AROM   AROM Assessment Site Knee    Right/Left Knee Right    Right Knee Extension 4    Right Knee Flexion 122      Strength   Strength Assessment Site Hip;Knee;Ankle    Right/Left Hip Right    Right Hip Flexion 5/5    Right Hip Extension 4/5    Right Hip ABduction 4+/5    Right/Left Knee Right    Right Knee Flexion 4/5    Right Knee Extension 4+/5    Right/Left Ankle Right    Right Ankle Plantar Flexion 4+/5      Transfers   Five time sit to stand comments  11.5      Ambulation/Gait   Ambulation/Gait Yes    Ambulation/Gait Assistance 7: Independent    Ambulation Distance (Feet) 478 Feet    Gait Pattern Within Functional Limits    Stairs Yes    Stairs Assistance 7: Independent    Stair Management Technique Alternating pattern;No rails    Gait Comments 2MWT  [place] [facility] PT Treatment/Exercise - 04/13/21 0001       Knee/Hip Exercises: Stretches   Sports administrator 2 reps;30 seconds    Quad Stretch Limitations prone with rope    Piriformis Stretch Right;3 reps;30 seconds      Knee/Hip Exercises: Aerobic   Recumbent Bike 4 minutes seat 13 level 2 for dynamic warm-up and ROM      Knee/Hip Exercises: Standing   Heel Raises Right;20 reps    Functional Squat 10 reps    Stairs 5 RT 7 inch step no HHA reciprocal pattern      Knee/Hip Exercises: Supine   Knee Extension AROM;Right    Knee Extension Limitations 2    Knee Flexion Right;AROM    Knee Flexion Limitations 122                       PT Short Term Goals - 04/13/21 0951       PT SHORT TERM GOAL #1   Title Patient will report understanding and regular compliance with HEP to improve mobility and decrease pain.    Baseline 1/6:  Reports complaince wiht advanced HEP; was: pt reports he is only doing heel slides for ROM, emphasized importance of strength development      PT SHORT TERM GOAL #2   Title  Demonstrate right knee extension 0 degrees and flexion 90 degrees to improve gait mechanics    Baseline 1/6: 2-122 degrees; was: lacking 1-2 degrees extension, 117 flexion    Status Partially Met      PT SHORT TERM GOAL #3   Title Ambulate 300 ft 2MWT with least restrictive AD to improve gait speed    Baseline 1/6: 2WMT no AD 471f; was: 185 ft with RW at start of care, 390 ft with independence presently    Status Achieved               PT Long Term Goals - 04/13/21 0933       PT LONG TERM GOAL #1   Title Patient will report an improvement of at least 50% in overall symptoms  to improve QOL.    Baseline 04/13/21: 80-85% subjective improments, was 50%    Status Achieved      PT LONG TERM GOAL #2   Title Ambulate independently with normalized pattern and stairs with reciprocal pattern    Baseline 04/13/21: Ambulates I with normal pattern and ability to complete reciprocal pattern wtih no HR needed, good control; deviations with stair ambulation    Time 6    Period Weeks    Status Achieved                   Plan - 04/13/21 1213     Clinical Impression Statement Pt arrived wiht reports of MD apt next week 1/10, reviewed goals with the following findings:  Pt has met 2/3 STGs and 2/2 LTGs.  Pt reports complaince with advanced HEP, pain minimal usually laying still at night, AROM 2-122.  Pt ambulated with gait mechanics WNL, increased cadence noted during 2MWT, able to ambulate reciprocal pattern ascend and descending stairs with HR needed and good control.  Pt educated on stretches to assist with donning socks as reports difficult task.  Reviewed current exercises to continue for functional strengthening.  Noted crepitus during gait and stair with Lt knee, stated he would discuss with MD next apt.    Personal Factors and Comorbidities Profession    Examination-Activity Limitations Bend;Carry;Lift;Toileting;Stairs;Squat;Sleep;Locomotion Level;Transfers  Examination-Participation Restrictions Cleaning;Community Activity;Occupation    Stability/Clinical Decision Making Stable/Uncomplicated    Clinical Decision Making Low    Rehab Potential Excellent    PT Frequency 3x / week    PT Duration 6 weeks    PT Treatment/Interventions ADLs/Self Care Home Management;Electrical Stimulation;DME Instruction;Gait training;Functional mobility training;Therapeutic activities;Therapeutic exercise;Balance training;Neuromuscular re-education;Patient/family education;Passive range of motion    PT Next Visit Plan DC to HEP.  F/U wiht MD.    PT Home Exercise Plan QS, heel slides (supine and seated) 12/7 bridge, SLR, heel raises 12/19 sit to stand, tandem stance 1/3 squat, hip abduction, hip extension, step ups; 1/6: piriformis stretch, squats, continue quad strengthening.    Consulted and Agree with Plan of Care Patient             Patient will benefit from skilled therapeutic intervention in order to improve the following deficits and impairments:  Abnormal gait, Decreased activity tolerance, Decreased mobility, Decreased knowledge of use of DME, Decreased range of motion, Decreased strength, Increased edema, Difficulty walking, Impaired perceived functional ability, Pain  Visit Diagnosis: Acute pain of right knee  Difficulty in walking, not elsewhere classified  Muscle weakness (generalized)     Problem List Patient Active Problem List   Diagnosis Date Noted   CELLULITIS, LEFT HAND 10/16/2009   Ihor Austin, LPTA/CLT; CBIS [phone]  Aldona Lento, PTA 04/13/2021, 12:21 PM  [place], [place], [address] Phone: [phone]   Fax:  [phone]  Name: SHIN LAMOUR MRN: [id number] Date of Birth: 1971/02/10

## 2021-04-16 ENCOUNTER — Encounter (HOSPITAL_COMMUNITY): Payer: BC Managed Care – PPO | Admitting: Physical Therapy

## 2021-04-18 ENCOUNTER — Encounter (HOSPITAL_COMMUNITY): Payer: BC Managed Care – PPO | Admitting: Physical Therapy

## 2021-04-20 ENCOUNTER — Encounter (HOSPITAL_COMMUNITY): Payer: BC Managed Care – PPO

## 2021-04-23 ENCOUNTER — Encounter (HOSPITAL_COMMUNITY): Payer: BC Managed Care – PPO | Admitting: Physical Therapy

## 2021-04-25 ENCOUNTER — Encounter (HOSPITAL_COMMUNITY): Payer: BC Managed Care – PPO | Admitting: Physical Therapy

## 2021-04-27 ENCOUNTER — Encounter (HOSPITAL_COMMUNITY): Payer: BC Managed Care – PPO

## 2021-04-30 ENCOUNTER — Encounter (HOSPITAL_COMMUNITY): Payer: BC Managed Care – PPO | Admitting: Physical Therapy

## 2021-05-02 ENCOUNTER — Encounter (HOSPITAL_COMMUNITY): Payer: BC Managed Care – PPO | Admitting: Physical Therapy

## 2021-05-04 ENCOUNTER — Encounter (HOSPITAL_COMMUNITY): Payer: BC Managed Care – PPO

## 2021-05-10 ENCOUNTER — Ambulatory Visit: Payer: Self-pay | Admitting: Physician Assistant

## 2021-05-10 DIAGNOSIS — M25562 Pain in left knee: Secondary | ICD-10-CM

## 2021-05-10 DIAGNOSIS — G8929 Other chronic pain: Secondary | ICD-10-CM

## 2021-05-10 DIAGNOSIS — M1712 Unilateral primary osteoarthritis, left knee: Secondary | ICD-10-CM

## 2021-05-10 NOTE — H&P (Signed)
TOTAL KNEE ADMISSION H&P  Patient is being admitted for left total knee arthroplasty.  Subjective:  Chief Complaint:left knee pain.  HPI: Riley Larson, 51 y.o. male, has a history of pain and functional disability in the left knee due to arthritis and has failed non-surgical conservative treatments for greater than 12 weeks to includeNSAID's and/or analgesics, corticosteriod injections, use of assistive devices, and activity modification.  Onset of symptoms was gradual, starting 5 years ago with gradually worsening course since that time. The patient noted no past surgery on the left knee(s).  Patient currently rates pain in the left knee(s) at 7 out of 10 with activity. Patient has night pain, worsening of pain with activity and weight bearing, pain that interferes with activities of daily living, pain with passive range of motion, crepitus, and joint swelling.  Patient has evidence of periarticular osteophytes and joint space narrowing by imaging studies. There is no active infection.  Patient Active Problem List   Diagnosis Date Noted   CELLULITIS, LEFT HAND 10/16/2009   Past Medical History:  Diagnosis Date   Arthritis    Hypertension     Past Surgical History:  Procedure Laterality Date   BACK SURGERY     COLONOSCOPY WITH PROPOFOL N/A 03/14/2020   Procedure: COLONOSCOPY WITH PROPOFOL;  Surgeon: Harvel Quale, MD;  [facility];  Service: Gastroenterology;  Laterality: N/A;  9:15   LAPAROSCOPIC GASTRIC BANDING     left index finger surgery      TOTAL KNEE ARTHROPLASTY Right 03/09/2021   Procedure: TOTAL KNEE ARTHROPLASTY;  Surgeon: Earlie Server, MD;  Location: [facility];  Service: Orthopedics;  Laterality: Right;    Current Outpatient Medications  Medication Sig Dispense Refill Last Dose   acetaminophen (TYLENOL) 325 MG tablet Take 2 tablets (650 mg total) by mouth every 4 (four) hours as needed. 100 tablet 2    allopurinol (ZYLOPRIM) 300 MG tablet Take 300  mg by mouth daily.      celecoxib (CELEBREX) 200 MG capsule Take 1 capsule (200 mg total) by mouth 2 (two) times daily. 60 capsule 2    colchicine 0.6 MG tablet Take 0.6 mg by mouth 2 (two) times daily as needed (gout).       docusate sodium (COLACE) 100 MG capsule Take 1 capsule (100 mg total) by mouth daily as needed. 30 capsule 2    lisinopril-hydrochlorothiazide (ZESTORETIC) 20-12.5 MG tablet Take 1 tablet by mouth daily.       methocarbamol (ROBAXIN) 500 MG tablet Take 1 tablet (500 mg total) by mouth every 6 (six) hours as needed for muscle spasms. 60 tablet 0    Multiple Vitamins-Minerals (MULTIVITAMIN WITH MINERALS) tablet Take 1 tablet by mouth daily. [employer] multi pack      oxyCODONE (OXY IR/ROXICODONE) 5 MG immediate release tablet Take one tab po q4-6hrs prn pain 40 tablet 0    sildenafil (REVATIO) 20 MG tablet Take 60 mg by mouth daily as needed (ED).       No current facility-administered medications for this visit.   No Known Allergies  Social History   Tobacco Use   Smoking status: Never   Smokeless tobacco: Former  Substance Use Topics   Alcohol use: Yes    Comment: occas    No family history on file.   Review of Systems  Musculoskeletal:  Positive for arthralgias.  All other systems reviewed and are negative.  Objective:  Physical Exam Constitutional:      General: He is not  in acute distress.    Appearance: Normal appearance.  HENT:     Head: Normocephalic and atraumatic.  Eyes:     Extraocular Movements: Extraocular movements intact.     Pupils: Pupils are equal, round, and reactive to light.  Cardiovascular:     Rate and Rhythm: Normal rate and regular rhythm.     Pulses: Normal pulses.     Heart sounds: Normal heart sounds.  Pulmonary:     Effort: Pulmonary effort is normal. No respiratory distress.     Breath sounds: Normal breath sounds. No wheezing.  Abdominal:     General: Abdomen is flat. Bowel sounds are normal. There is no distension.      Palpations: Abdomen is soft.     Tenderness: There is no abdominal tenderness.  Musculoskeletal:     Cervical back: Normal range of motion and neck supple.     Left knee: Swelling and bony tenderness present. Tenderness present. LCL laxity present. Abnormal alignment.  Lymphadenopathy:     Cervical: No cervical adenopathy.  Skin:    General: Skin is warm and dry.     Findings: No erythema or rash.  Neurological:     General: No focal deficit present.     Mental Status: He is alert and oriented to person, place, and time.  Psychiatric:        Mood and Affect: Mood normal.        Behavior: Behavior normal.    Vital signs in last 24 hours: @VSRANGES @  Labs:   Estimated body mass index is 33.23 kg/m as calculated from the following:   Height as of 03/09/21: 5\' 9"  (1.753 m).   Weight as of 03/09/21: 102.1 kg.   Imaging Review Plain radiographs demonstrate moderate degenerative joint disease of the left knee(s). The overall alignment issignificant valgus. The bone quality appears to be good for age and reported activity level.      Assessment/Plan:  End stage arthritis, left knee   The patient history, physical examination, clinical judgment of the provider and imaging studies are consistent with end stage degenerative joint disease of the left knee(s) and total knee arthroplasty is deemed medically necessary. The treatment options including medical management, injection therapy arthroscopy and arthroplasty were discussed at length. The risks and benefits of total knee arthroplasty were presented and reviewed. The risks due to aseptic loosening, infection, stiffness, patella tracking problems, thromboembolic complications and other imponderables were discussed. The patient acknowledged the explanation, agreed to proceed with the plan and consent was signed. Patient is being admitted for inpatient treatment for surgery, pain control, PT, OT, prophylactic antibiotics, VTE prophylaxis,  progressive ambulation and ADL's and discharge planning. The patient is planning to be discharged  home with outpt PT    Anticipated LOS equal to or greater than 2 midnights due to - Age 8 and older with one or more of the following:  - Obesity  - Expected need for hospital services (PT, OT, Nursing) required for safe  discharge  - Anticipated need for postoperative skilled nursing care or inpatient rehab  - Active co-morbidities: None OR   - Unanticipated findings during/Post Surgery: None  - Patient is a high risk of re-admission due to: None

## 2021-05-10 NOTE — H&P (View-Only) (Signed)
TOTAL KNEE ADMISSION H&P  Patient is being admitted for left total knee arthroplasty.  Subjective:  Chief Complaint:left knee pain.  HPI: Riley Larson, 51 y.o. male, has a history of pain and functional disability in the left knee due to arthritis and has failed non-surgical conservative treatments for greater than 12 weeks to includeNSAID's and/or analgesics, corticosteriod injections, use of assistive devices, and activity modification.  Onset of symptoms was gradual, starting 5 years ago with gradually worsening course since that time. The patient noted no past surgery on the left knee(s).  Patient currently rates pain in the left knee(s) at 7 out of 10 with activity. Patient has night pain, worsening of pain with activity and weight bearing, pain that interferes with activities of daily living, pain with passive range of motion, crepitus, and joint swelling.  Patient has evidence of periarticular osteophytes and joint space narrowing by imaging studies. There is no active infection.  Patient Active Problem List   Diagnosis Date Noted   CELLULITIS, LEFT HAND 10/16/2009   Past Medical History:  Diagnosis Date   Arthritis    Hypertension     Past Surgical History:  Procedure Laterality Date   BACK SURGERY     COLONOSCOPY WITH PROPOFOL N/A 03/14/2020   Procedure: COLONOSCOPY WITH PROPOFOL;  Surgeon: Harvel Quale, MD;  [facility];  Service: Gastroenterology;  Laterality: N/A;  9:15   LAPAROSCOPIC GASTRIC BANDING     left index finger surgery      TOTAL KNEE ARTHROPLASTY Right 03/09/2021   Procedure: TOTAL KNEE ARTHROPLASTY;  Surgeon: Earlie Server, MD;  Location: [facility];  Service: Orthopedics;  Laterality: Right;    Current Outpatient Medications  Medication Sig Dispense Refill Last Dose   acetaminophen (TYLENOL) 325 MG tablet Take 2 tablets (650 mg total) by mouth every 4 (four) hours as needed. 100 tablet 2    allopurinol (ZYLOPRIM) 300 MG tablet Take 300  mg by mouth daily.      celecoxib (CELEBREX) 200 MG capsule Take 1 capsule (200 mg total) by mouth 2 (two) times daily. 60 capsule 2    colchicine 0.6 MG tablet Take 0.6 mg by mouth 2 (two) times daily as needed (gout).       docusate sodium (COLACE) 100 MG capsule Take 1 capsule (100 mg total) by mouth daily as needed. 30 capsule 2    lisinopril-hydrochlorothiazide (ZESTORETIC) 20-12.5 MG tablet Take 1 tablet by mouth daily.       methocarbamol (ROBAXIN) 500 MG tablet Take 1 tablet (500 mg total) by mouth every 6 (six) hours as needed for muscle spasms. 60 tablet 0    Multiple Vitamins-Minerals (MULTIVITAMIN WITH MINERALS) tablet Take 1 tablet by mouth daily. [employer] multi pack      oxyCODONE (OXY IR/ROXICODONE) 5 MG immediate release tablet Take one tab po q4-6hrs prn pain 40 tablet 0    sildenafil (REVATIO) 20 MG tablet Take 60 mg by mouth daily as needed (ED).       No current facility-administered medications for this visit.   No Known Allergies  Social History   Tobacco Use   Smoking status: Never   Smokeless tobacco: Former  Substance Use Topics   Alcohol use: Yes    Comment: occas    No family history on file.   Review of Systems  Musculoskeletal:  Positive for arthralgias.  All other systems reviewed and are negative.  Objective:  Physical Exam Constitutional:      General: He is not  in acute distress.    Appearance: Normal appearance.  HENT:     Head: Normocephalic and atraumatic.  Eyes:     Extraocular Movements: Extraocular movements intact.     Pupils: Pupils are equal, round, and reactive to light.  Cardiovascular:     Rate and Rhythm: Normal rate and regular rhythm.     Pulses: Normal pulses.     Heart sounds: Normal heart sounds.  Pulmonary:     Effort: Pulmonary effort is normal. No respiratory distress.     Breath sounds: Normal breath sounds. No wheezing.  Abdominal:     General: Abdomen is flat. Bowel sounds are normal. There is no distension.      Palpations: Abdomen is soft.     Tenderness: There is no abdominal tenderness.  Musculoskeletal:     Cervical back: Normal range of motion and neck supple.     Left knee: Swelling and bony tenderness present. Tenderness present. LCL laxity present. Abnormal alignment.  Lymphadenopathy:     Cervical: No cervical adenopathy.  Skin:    General: Skin is warm and dry.     Findings: No erythema or rash.  Neurological:     General: No focal deficit present.     Mental Status: He is alert and oriented to person, place, and time.  Psychiatric:        Mood and Affect: Mood normal.        Behavior: Behavior normal.    Vital signs in last 24 hours: @VSRANGES @  Labs:   Estimated body mass index is 33.23 kg/m as calculated from the following:   Height as of 03/09/21: 5\' 9"  (1.753 m).   Weight as of 03/09/21: 102.1 kg.   Imaging Review Plain radiographs demonstrate moderate degenerative joint disease of the left knee(s). The overall alignment issignificant valgus. The bone quality appears to be good for age and reported activity level.      Assessment/Plan:  End stage arthritis, left knee   The patient history, physical examination, clinical judgment of the provider and imaging studies are consistent with end stage degenerative joint disease of the left knee(s) and total knee arthroplasty is deemed medically necessary. The treatment options including medical management, injection therapy arthroscopy and arthroplasty were discussed at length. The risks and benefits of total knee arthroplasty were presented and reviewed. The risks due to aseptic loosening, infection, stiffness, patella tracking problems, thromboembolic complications and other imponderables were discussed. The patient acknowledged the explanation, agreed to proceed with the plan and consent was signed. Patient is being admitted for inpatient treatment for surgery, pain control, PT, OT, prophylactic antibiotics, VTE prophylaxis,  progressive ambulation and ADL's and discharge planning. The patient is planning to be discharged  home with outpt PT    Anticipated LOS equal to or greater than 2 midnights due to - Age 33 and older with one or more of the following:  - Obesity  - Expected need for hospital services (PT, OT, Nursing) required for safe  discharge  - Anticipated need for postoperative skilled nursing care or inpatient rehab  - Active co-morbidities: None OR   - Unanticipated findings during/Post Surgery: None  - Patient is a high risk of re-admission due to: None

## 2021-05-18 NOTE — Patient Instructions (Addendum)
DUE TO COVID-19 ONLY ONE VISITOR IS ALLOWED TO COME WITH YOU AND STAY IN THE WAITING ROOM ONLY DURING PRE OP AND PROCEDURE.   **NO VISITORS ARE ALLOWED IN THE SHORT STAY AREA OR RECOVERY ROOM!!**       Your procedure is scheduled on: 06/01/21   Report to [facility] Main Entrance    Report to admitting at 5:15 AM   Call this number if you have problems the morning of surgery [phone]   Do not eat food :After Midnight.   May have liquids until 4:30 AM day of surgery  CLEAR LIQUID DIET  Foods Allowed                                                                     Foods Excluded  Water, Black Coffee and tea, regular and decaf                             liquids that you cannot  Plain Jell-O in any flavor  (No red)                                           see through such as: Fruit ices (not with fruit pulp)                                     milk, soups, orange juice              Iced Popsicles (No red)                                    All solid food                                   Apple juices Sports drinks like Gatorade (No red) Lightly seasoned clear broth or consume(fat free) Sugar     The day of surgery:  Drink ONE (1) Pre-Surgery Clear Ensure at 4:30 AM the morning of surgery. Drink in one sitting. Do not sip.  This drink was given to you during your hospital  pre-op appointment visit. Nothing else to drink after completing the  Pre-Surgery Clear Ensure.          If you have questions, please contact your surgeons office.  FOLLOW BOWEL PREP AND ANY ADDITIONAL PRE OP INSTRUCTIONS YOU RECEIVED FROM YOUR SURGEON'S OFFICE!!!     Oral Hygiene is also important to reduce your risk of infection.                                    Remember - BRUSH YOUR TEETH THE MORNING OF SURGERY WITH YOUR REGULAR TOOTHPASTE   Follow instructions you were given regarding Aspirin    Stop all vitamins or supplements 7 days before surgery   Take  these medicines the  morning of surgery with A SIP OF WATER: Allopurinol                              You may not have any metal on your body including jewelry, and body piercing             Do not wear lotions, powders, cologne, or deodorant              Men may shave face and neck.   Do not bring valuables to the hospital. [place].    Patients discharged on the day of surgery will not be allowed to drive home.  Someone needs to stay with you for the first 24 hours after anesthesia.              Please read over the following fact sheets you were given: IF YOU HAVE QUESTIONS ABOUT YOUR PRE-OP INSTRUCTIONS PLEASE CALL [place] - Preparing for Surgery Before surgery, you can play an important role.  Because skin is not sterile, your skin needs to be as free of germs as possible.  You can reduce the number of germs on your skin by washing with CHG (chlorahexidine gluconate) soap before surgery.  CHG is an antiseptic cleaner which kills germs and bonds with the skin to continue killing germs even after washing. Please DO NOT use if you have an allergy to CHG or antibacterial soaps.  If your skin becomes reddened/irritated stop using the CHG and inform your nurse when you arrive at Short Stay. Do not shave (including legs and underarms) for at least 48 hours prior to the first CHG shower.  You may shave your face/neck.  Please follow these instructions carefully:  1.  Shower with CHG Soap the night before surgery and the  morning of surgery.  2.  If you choose to wash your hair, wash your hair first as usual with your normal  shampoo.  3.  After you shampoo, rinse your hair and body thoroughly to remove the shampoo.                             4.  Use CHG as you would any other liquid soap.  You can apply chg directly to the skin and wash.  Gently with a scrungie or clean washcloth.  5.  Apply the CHG Soap to your body ONLY FROM THE NECK  DOWN.   Do   not use on face/ open                           Wound or open sores. Avoid contact with eyes, ears mouth and   genitals (private parts).                       Wash face,  Genitals (private parts) with your normal soap.             6.  Wash thoroughly, paying special attention to the area where your    surgery  will be performed.  7.  Thoroughly rinse your body with warm water from the neck down.  8.  DO NOT shower/wash with your normal soap after  using and rinsing off the CHG Soap.                9.  Pat yourself dry with a clean towel.            10.  Wear clean pajamas.            11.  Place clean sheets on your bed the night of your first shower and do not  sleep with pets. Day of Surgery : Do not apply any lotions/deodorants the morning of surgery.  Please wear clean clothes to the hospital/surgery center.  FAILURE TO FOLLOW THESE INSTRUCTIONS MAY RESULT IN THE CANCELLATION OF YOUR SURGERY  PATIENT SIGNATURE_________________________________  NURSE SIGNATURE__________________________________  ________________________________________________________________________   Riley Larson  An incentive spirometer is a tool that can help keep your lungs clear and active. This tool measures how well you are filling your lungs with each breath. Taking long deep breaths may help reverse or decrease the chance of developing breathing (pulmonary) problems (especially infection) following: A long period of time when you are unable to move or be active. BEFORE THE PROCEDURE  If the spirometer includes an indicator to show your best effort, your nurse or respiratory therapist will set it to a desired goal. If possible, sit up straight or lean slightly forward. Try not to slouch. Hold the incentive spirometer in an upright position. INSTRUCTIONS FOR USE  Sit on the edge of your bed if possible, or sit up as far as you can in bed or on a chair. Hold the incentive spirometer in an  upright position. Breathe out normally. Place the mouthpiece in your mouth and seal your lips tightly around it. Breathe in slowly and as deeply as possible, raising the piston or the ball toward the top of the column. Hold your breath for 3-5 seconds or for as long as possible. Allow the piston or ball to fall to the bottom of the column. Remove the mouthpiece from your mouth and breathe out normally. Rest for a few seconds and repeat Steps 1 through 7 at least 10 times every 1-2 hours when you are awake. Take your time and take a few normal breaths between deep breaths. The spirometer may include an indicator to show your best effort. Use the indicator as a goal to work toward during each repetition. After each set of 10 deep breaths, practice coughing to be sure your lungs are clear. If you have an incision (the cut made at the time of surgery), support your incision when coughing by placing a pillow or rolled up towels firmly against it. Once you are able to get out of bed, walk around indoors and cough well. You may stop using the incentive spirometer when instructed by your caregiver.  RISKS AND COMPLICATIONS Take your time so you do not get dizzy or light-headed. If you are in pain, you may need to take or ask for pain medication before doing incentive spirometry. It is harder to take a deep breath if you are having pain. AFTER USE Rest and breathe slowly and easily. It can be helpful to keep track of a log of your progress. Your caregiver can provide you with a simple table to help with this. If you are using the spirometer at home, follow these instructions: [place] IF:  You are having difficultly using the spirometer. You have trouble using the spirometer as often as instructed. Your pain medication is not giving enough relief while using the spirometer. You  develop fever of 100.5 F (38.1 C) or higher. SEEK IMMEDIATE MEDICAL CARE IF:  You cough up bloody sputum that had  not been present before. You develop fever of 102 F (38.9 C) or greater. You develop worsening pain at or near the incision site. MAKE SURE YOU:  Understand these instructions. Will watch your condition. Will get help right away if you are not doing well or get worse. Document Released: 08/05/2006 Document Revised: 06/17/2011 Document Reviewed: 10/06/2006 [facility] Patient Information 2014 [place], [place].   ________________________________________________________________________

## 2021-05-18 NOTE — Progress Notes (Addendum)
COVID swab appointment: n/a  COVID Vaccine Completed: yes x2 Date COVID Vaccine completed: Has received booster: COVID vaccine manufacturer: Pfizer    Moderna    Date of COVID positive in last 90 days: no  PCP - Sharilyn Sites, MD Cardiologist - n/a  Chest x-ray - n/a EKG - 02/27/21 Epic Stress Test - n/a ECHO - n/a Cardiac Cath - n/a Pacemaker/ICD device last checked: n/a Spinal Cord Stimulator: n/a  Bowel Prep - no  Sleep Study - yes, negative CPAP -   Fasting Blood Sugar - n/a Checks Blood Sugar _____ times a day  Blood Thinner Instructions: Aspirin Instructions: ASA 81, hold 7 days  Last Dose:  Activity level: Can go up a flight of stairs and perform activities of daily living without stopping and without symptoms of chest pain or shortness of breath.      Anesthesia review:   Patient denies shortness of breath, fever, cough and chest pain at PAT appointment   Patient verbalized understanding of instructions that were given to them at the PAT appointment. Patient was also instructed that they will need to review over the PAT instructions again at home before surgery.

## 2021-05-21 ENCOUNTER — Encounter (HOSPITAL_COMMUNITY)
Admission: RE | Admit: 2021-05-21 | Discharge: 2021-05-21 | Disposition: A | Discharge status: Home / Self Care | Payer: BC Managed Care – PPO | Source: Ambulatory Visit | Attending: Orthopedic Surgery | Admitting: Orthopedic Surgery

## 2021-05-21 ENCOUNTER — Encounter (HOSPITAL_COMMUNITY): Payer: Self-pay

## 2021-05-21 ENCOUNTER — Other Ambulatory Visit: Payer: Self-pay

## 2021-05-21 VITALS — BP 147/89 | HR 93 | Temp 98.4°F | Resp 14 | Ht 70.5 in | Wt 220.2 lb

## 2021-05-21 DIAGNOSIS — G8929 Other chronic pain: Secondary | ICD-10-CM | POA: Diagnosis not present

## 2021-05-21 DIAGNOSIS — M25562 Pain in left knee: Secondary | ICD-10-CM | POA: Diagnosis not present

## 2021-05-21 DIAGNOSIS — M1712 Unilateral primary osteoarthritis, left knee: Secondary | ICD-10-CM | POA: Insufficient documentation

## 2021-05-21 DIAGNOSIS — Z01812 Encounter for preprocedural laboratory examination: Secondary | ICD-10-CM | POA: Diagnosis present

## 2021-05-21 DIAGNOSIS — Z01818 Encounter for other preprocedural examination: Secondary | ICD-10-CM

## 2021-05-21 LAB — COMPREHENSIVE METABOLIC PANEL
ALT: 43 U/L (ref 0–44)
AST: 41 U/L (ref 15–41)
Albumin: 4.8 g/dL (ref 3.5–5.0)
Alkaline Phosphatase: 53 U/L (ref 38–126)
Anion gap: 12 (ref 5–15)
BUN: 7 mg/dL (ref 6–20)
CO2: 27 mmol/L (ref 22–32)
Calcium: 9.2 mg/dL (ref 8.9–10.3)
Chloride: 95 mmol/L — ABNORMAL LOW (ref 98–111)
Creatinine, Ser: 0.59 mg/dL — ABNORMAL LOW (ref 0.61–1.24)
GFR, Estimated: >60 mL/min (ref >60)
Glucose, Bld: 93 mg/dL (ref 70–99)
Potassium: 4.3 mmol/L (ref 3.5–5.1)
Sodium: 134 mmol/L — ABNORMAL LOW (ref 135–145)
Total Bilirubin: 0.4 mg/dL (ref 0.3–1.2)
Total Protein: 7.8 g/dL (ref 6.5–8.1)

## 2021-05-21 LAB — CBC WITH DIFFERENTIAL/PLATELET
Abs Immature Granulocytes: 0.02 10*3/uL (ref 0.00–0.07)
Basophils Absolute: 0.1 10*3/uL (ref 0.0–0.1)
Basophils Relative: 1 %
Eosinophils Absolute: 0.1 10*3/uL (ref 0.0–0.5)
Eosinophils Relative: 1 %
HCT: 46.6 % (ref 39.0–52.0)
Hemoglobin: 16 g/dL (ref 13.0–17.0)
Immature Granulocytes: 0 %
Lymphocytes Relative: 13 %
Lymphs Abs: 1 10*3/uL (ref 0.7–4.0)
MCH: 33.3 pg (ref 26.0–34.0)
MCHC: 34.3 g/dL (ref 30.0–36.0)
MCV: 96.9 fL (ref 80.0–100.0)
Monocytes Absolute: 0.9 10*3/uL (ref 0.1–1.0)
Monocytes Relative: 12 %
Neutro Abs: 5.8 10*3/uL (ref 1.7–7.7)
Neutrophils Relative %: 73 %
Platelets: 268 10*3/uL (ref 150–400)
RBC: 4.81 MIL/uL (ref 4.22–5.81)
RDW: 12.3 % (ref 11.5–15.5)
WBC: 7.9 10*3/uL (ref 4.0–10.5)
nRBC: 0 % (ref 0.0–0.2)

## 2021-05-21 LAB — SURGICAL PCR SCREEN
MRSA, PCR: NEGATIVE
Staphylococcus aureus: NEGATIVE

## 2021-05-31 NOTE — Anesthesia Preprocedure Evaluation (Addendum)
Anesthesia Evaluation  Patient identified by MRN, date of birth, ID band Patient awake    Reviewed: Allergy & Precautions, NPO status , Patient's Chart, lab work & pertinent test results  History of Anesthesia Complications Negative for: history of anesthetic complications  Airway Mallampati: I  TM Distance: >3 FB Neck ROM: Full    Dental  (+) Dental Advisory Given   Pulmonary neg pulmonary ROS,    breath sounds clear to auscultation       Cardiovascular hypertension, Pt. on medications (-) angina Rhythm:Regular Rate:Normal     Neuro/Psych negative neurological ROS     GI/Hepatic Neg liver ROS, neg GERD  ,H/o lap band   Endo/Other  obese  Renal/GU negative Renal ROS     Musculoskeletal  (+) Arthritis , Osteoarthritis,    Abdominal (+) + obese,   Peds  Hematology negative hematology ROS (+)   Anesthesia Other Findings   Reproductive/Obstetrics                            Anesthesia Physical Anesthesia Plan  ASA: 2  Anesthesia Plan: Spinal   Post-op Pain Management: Regional block* and Tylenol PO (pre-op)*   Induction:   PONV Risk Score and Plan: 1 and Ondansetron and Treatment may vary due to age or medical condition  Airway Management Planned: Natural Airway and Simple Face Mask  Additional Equipment: None  Intra-op Plan:   Post-operative Plan:   Informed Consent: I have reviewed the patients History and Physical, chart, labs and discussed the procedure including the risks, benefits and alternatives for the proposed anesthesia with the patient or authorized representative who has indicated his/her understanding and acceptance.     Dental advisory given  Plan Discussed with: CRNA and Surgeon  Anesthesia Plan Comments: (Plan routine monitors, SAB with adductor canal block for post op analgesia)       Anesthesia Quick Evaluation

## 2021-06-01 ENCOUNTER — Ambulatory Visit (HOSPITAL_COMMUNITY)
Admission: RE | Admit: 2021-06-01 | Discharge: 2021-06-01 | Disposition: A | Discharge status: Home / Self Care | Payer: BC Managed Care – PPO | Source: Ambulatory Visit | Attending: Orthopedic Surgery | Admitting: Orthopedic Surgery

## 2021-06-01 ENCOUNTER — Encounter (HOSPITAL_COMMUNITY)
Admission: RE | Disposition: A | Discharge status: Home / Self Care | Payer: Self-pay | Source: Ambulatory Visit | Attending: Orthopedic Surgery

## 2021-06-01 ENCOUNTER — Ambulatory Visit (HOSPITAL_COMMUNITY): Payer: BC Managed Care – PPO | Admitting: Anesthesiology

## 2021-06-01 ENCOUNTER — Encounter (HOSPITAL_COMMUNITY): Payer: Self-pay | Admitting: Orthopedic Surgery

## 2021-06-01 DIAGNOSIS — M25762 Osteophyte, left knee: Secondary | ICD-10-CM | POA: Diagnosis not present

## 2021-06-01 DIAGNOSIS — I10 Essential (primary) hypertension: Secondary | ICD-10-CM | POA: Insufficient documentation

## 2021-06-01 DIAGNOSIS — M21062 Valgus deformity, not elsewhere classified, left knee: Secondary | ICD-10-CM | POA: Diagnosis not present

## 2021-06-01 DIAGNOSIS — M1712 Unilateral primary osteoarthritis, left knee: Secondary | ICD-10-CM | POA: Diagnosis present

## 2021-06-01 DIAGNOSIS — Z01818 Encounter for other preprocedural examination: Secondary | ICD-10-CM

## 2021-06-01 DIAGNOSIS — E669 Obesity, unspecified: Secondary | ICD-10-CM | POA: Insufficient documentation

## 2021-06-01 DIAGNOSIS — Z6831 Body mass index (BMI) 31.0-31.9, adult: Secondary | ICD-10-CM | POA: Diagnosis not present

## 2021-06-01 HISTORY — PX: TOTAL KNEE ARTHROPLASTY: SHX125

## 2021-06-01 SURGERY — ARTHROPLASTY, KNEE, TOTAL
Anesthesia: Spinal | Site: Knee | Laterality: Left

## 2021-06-01 MED ORDER — MIDAZOLAM HCL 2 MG/2ML IJ SOLN
INTRAMUSCULAR | Status: AC
  Filled 2021-06-01: qty 2

## 2021-06-01 MED ORDER — CELECOXIB 200 MG PO CAPS: 200.0000 mg | ORAL_CAPSULE | Freq: Two times a day (BID) | ORAL | 0 refills | Status: DC

## 2021-06-01 MED ORDER — TRANEXAMIC ACID 1000 MG/10ML IV SOLN
INTRAVENOUS | Status: DC | PRN
  Administered 2021-06-01: 2000 mg via TOPICAL

## 2021-06-01 MED ORDER — ACETAMINOPHEN 500 MG PO TABS: 1000.0000 mg | ORAL_TABLET | Freq: Once | ORAL | Status: DC

## 2021-06-01 MED ORDER — TRANEXAMIC ACID 1000 MG/10ML IV SOLN
2000.0000 mg | INTRAVENOUS | Status: DC
  Filled 2021-06-01: qty 20

## 2021-06-01 MED ORDER — EPHEDRINE SULFATE-NACL 50-0.9 MG/10ML-% IV SOSY
PREFILLED_SYRINGE | INTRAVENOUS | Status: DC | PRN
Start: 2021-06-01 — End: 2021-06-01
  Administered 2021-06-01 (×2): 5 mg via INTRAVENOUS
  Administered 2021-06-01: 10 mg via INTRAVENOUS

## 2021-06-01 MED ORDER — METHOCARBAMOL 500 MG PO TABS
500.0000 mg | ORAL_TABLET | Freq: Four times a day (QID) | ORAL | 0 refills | Status: DC | PRN
Start: 2021-06-01 — End: 2022-03-27

## 2021-06-01 MED ORDER — OXYCODONE HCL 5 MG PO TABS: ORAL_TABLET | ORAL | 0 refills | Status: DC

## 2021-06-01 MED ORDER — SODIUM CHLORIDE 0.9% FLUSH
INTRAVENOUS | Status: DC | PRN
  Administered 2021-06-01: 50 mL

## 2021-06-01 MED ORDER — HYDROMORPHONE HCL 1 MG/ML IJ SOLN: 0.2500 mg | INTRAMUSCULAR | Status: DC | PRN

## 2021-06-01 MED ORDER — LACTATED RINGERS IV BOLUS
250.0000 mL | Freq: Once | INTRAVENOUS | Status: AC
  Administered 2021-06-01: 250 mL via INTRAVENOUS

## 2021-06-01 MED ORDER — METHOCARBAMOL 500 MG PO TABS: 500.0000 mg | ORAL_TABLET | Freq: Four times a day (QID) | ORAL | Status: DC | PRN

## 2021-06-01 MED ORDER — OXYCODONE HCL 5 MG/5ML PO SOLN: 5.0000 mg | Freq: Once | ORAL | Status: AC | PRN

## 2021-06-01 MED ORDER — ROPIVACAINE HCL 7.5 MG/ML IJ SOLN
INTRAMUSCULAR | Status: DC | PRN
  Administered 2021-06-01: 20 mL via PERINEURAL

## 2021-06-01 MED ORDER — PROPOFOL 1000 MG/100ML IV EMUL
INTRAVENOUS | Status: AC
  Filled 2021-06-01: qty 100

## 2021-06-01 MED ORDER — PROPOFOL 500 MG/50ML IV EMUL
INTRAVENOUS | Status: DC | PRN
  Administered 2021-06-01: 50 ug/kg/min via INTRAVENOUS

## 2021-06-01 MED ORDER — LACTATED RINGERS IV SOLN: INTRAVENOUS | Status: DC

## 2021-06-01 MED ORDER — PHENYLEPHRINE HCL (PRESSORS) 10 MG/ML IV SOLN
INTRAVENOUS | Status: AC
  Filled 2021-06-01: qty 1

## 2021-06-01 MED ORDER — OXYCODONE HCL 5 MG PO TABS
5.0000 mg | ORAL_TABLET | Freq: Once | ORAL | Status: AC | PRN
  Administered 2021-06-01: 5 mg via ORAL

## 2021-06-01 MED ORDER — METHOCARBAMOL 500 MG IVPB - SIMPLE MED: 500.0000 mg | Freq: Four times a day (QID) | INTRAVENOUS | Status: DC | PRN

## 2021-06-01 MED ORDER — PHENYLEPHRINE HCL-NACL 20-0.9 MG/250ML-% IV SOLN
INTRAVENOUS | Status: DC | PRN
  Administered 2021-06-01: 40 ug/min via INTRAVENOUS

## 2021-06-01 MED ORDER — OXYCODONE HCL 5 MG PO TABS
ORAL_TABLET | ORAL | Status: AC
  Filled 2021-06-01: qty 2

## 2021-06-01 MED ORDER — BUPIVACAINE IN DEXTROSE 0.75-8.25 % IT SOLN
INTRATHECAL | Status: DC | PRN
  Administered 2021-06-01: 15 mg via INTRATHECAL

## 2021-06-01 MED ORDER — BUPIVACAINE LIPOSOME 1.3 % IJ SUSP: 20.0000 mL | Freq: Once | INTRAMUSCULAR | Status: DC

## 2021-06-01 MED ORDER — BUPIVACAINE-EPINEPHRINE (PF) 0.25% -1:200000 IJ SOLN
INTRAMUSCULAR | Status: DC | PRN
Start: 2021-06-01 — End: 2021-06-01
  Administered 2021-06-01: 30 mL

## 2021-06-01 MED ORDER — ORAL CARE MOUTH RINSE: 15.0000 mL | Freq: Once | OROMUCOSAL | Status: AC

## 2021-06-01 MED ORDER — SODIUM CHLORIDE 0.9 % IV SOLN: INTRAVENOUS | Status: DC

## 2021-06-01 MED ORDER — TRANEXAMIC ACID-NACL 1000-0.7 MG/100ML-% IV SOLN: 1000.0000 mg | Freq: Once | INTRAVENOUS | Status: AC

## 2021-06-01 MED ORDER — FENTANYL CITRATE (PF) 100 MCG/2ML IJ SOLN
INTRAMUSCULAR | Status: AC
  Filled 2021-06-01: qty 2

## 2021-06-01 MED ORDER — WATER FOR IRRIGATION, STERILE IR SOLN
Status: DC | PRN
Start: 2021-06-01 — End: 2021-06-01
  Administered 2021-06-01: 2000 mL

## 2021-06-01 MED ORDER — BUPIVACAINE-EPINEPHRINE (PF) 0.25% -1:200000 IJ SOLN
INTRAMUSCULAR | Status: AC
  Filled 2021-06-01: qty 30

## 2021-06-01 MED ORDER — MIDAZOLAM HCL 2 MG/2ML IJ SOLN: 0.5000 mg | Freq: Once | INTRAMUSCULAR | Status: DC | PRN

## 2021-06-01 MED ORDER — PHENYLEPHRINE 40 MCG/ML (10ML) SYRINGE FOR IV PUSH (FOR BLOOD PRESSURE SUPPORT)
PREFILLED_SYRINGE | INTRAVENOUS | Status: DC | PRN
Start: 2021-06-01 — End: 2021-06-01
  Administered 2021-06-01: 80 ug via INTRAVENOUS
  Administered 2021-06-01 (×3): 40 ug via INTRAVENOUS

## 2021-06-01 MED ORDER — CHLORHEXIDINE GLUCONATE 0.12 % MT SOLN
15.0000 mL | Freq: Once | OROMUCOSAL | Status: AC
  Administered 2021-06-01: 15 mL via OROMUCOSAL

## 2021-06-01 MED ORDER — 0.9 % SODIUM CHLORIDE (POUR BTL) OPTIME
TOPICAL | Status: DC | PRN
  Administered 2021-06-01: 1000 mL

## 2021-06-01 MED ORDER — FENTANYL CITRATE (PF) 100 MCG/2ML IJ SOLN
INTRAMUSCULAR | Status: DC | PRN
  Administered 2021-06-01 (×2): 50 ug via INTRAVENOUS

## 2021-06-01 MED ORDER — CEFAZOLIN SODIUM-DEXTROSE 2-4 GM/100ML-% IV SOLN
2.0000 g | INTRAVENOUS | Status: AC
  Administered 2021-06-01: 2 g via INTRAVENOUS
  Filled 2021-06-01: qty 100

## 2021-06-01 MED ORDER — BUPIVACAINE LIPOSOME 1.3 % IJ SUSP
INTRAMUSCULAR | Status: DC | PRN
  Administered 2021-06-01: 20 mL

## 2021-06-01 MED ORDER — TRANEXAMIC ACID-NACL 1000-0.7 MG/100ML-% IV SOLN
1000.0000 mg | INTRAVENOUS | Status: AC
  Administered 2021-06-01: 1000 mg via INTRAVENOUS
  Filled 2021-06-01: qty 100

## 2021-06-01 MED ORDER — ONDANSETRON HCL 4 MG/2ML IJ SOLN
INTRAMUSCULAR | Status: DC | PRN
Start: 2021-06-01 — End: 2021-06-01
  Administered 2021-06-01: 4 mg via INTRAVENOUS

## 2021-06-01 MED ORDER — SODIUM CHLORIDE 0.9 % IR SOLN
Status: DC | PRN
  Administered 2021-06-01: 1000 mL

## 2021-06-01 MED ORDER — PROPOFOL 10 MG/ML IV BOLUS
INTRAVENOUS | Status: DC | PRN
Start: 2021-06-01 — End: 2021-06-01
  Administered 2021-06-01: 20 mg via INTRAVENOUS
  Administered 2021-06-01: 30 mg via INTRAVENOUS

## 2021-06-01 MED ORDER — TRANEXAMIC ACID-NACL 1000-0.7 MG/100ML-% IV SOLN
INTRAVENOUS | Status: AC
  Administered 2021-06-01: 1000 mg via INTRAVENOUS
  Filled 2021-06-01: qty 100

## 2021-06-01 MED ORDER — METHOCARBAMOL 500 MG IVPB - SIMPLE MED
INTRAVENOUS | Status: AC
  Administered 2021-06-01: 500 mg via INTRAVENOUS
  Filled 2021-06-01: qty 50

## 2021-06-01 MED ORDER — DEXAMETHASONE SODIUM PHOSPHATE 10 MG/ML IJ SOLN
INTRAMUSCULAR | Status: DC | PRN
  Administered 2021-06-01: 10 mg via INTRAVENOUS

## 2021-06-01 MED ORDER — POVIDONE-IODINE 10 % EX SWAB
2.0000 "application " | Freq: Once | CUTANEOUS | Status: AC
  Administered 2021-06-01: 2 via TOPICAL

## 2021-06-01 MED ORDER — MEPERIDINE HCL 50 MG/ML IJ SOLN: 6.2500 mg | INTRAMUSCULAR | Status: DC | PRN

## 2021-06-01 MED ORDER — ASPIRIN EC 81 MG PO TBEC: 81.0000 mg | DELAYED_RELEASE_TABLET | Freq: Two times a day (BID) | ORAL | 0 refills | Status: DC

## 2021-06-01 MED ORDER — MIDAZOLAM HCL 5 MG/5ML IJ SOLN
INTRAMUSCULAR | Status: DC | PRN
  Administered 2021-06-01 (×4): 1 mg via INTRAVENOUS

## 2021-06-01 MED ORDER — BUPIVACAINE LIPOSOME 1.3 % IJ SUSP
INTRAMUSCULAR | Status: AC
Start: 2021-06-01 — End: ?
  Filled 2021-06-01: qty 20

## 2021-06-01 MED ORDER — OXYCODONE HCL 5 MG PO TABS: 5.0000 mg | ORAL_TABLET | ORAL | Status: DC | PRN

## 2021-06-01 MED ORDER — LACTATED RINGERS IV BOLUS
500.0000 mL | Freq: Once | INTRAVENOUS | Status: AC
  Administered 2021-06-01: 500 mL via INTRAVENOUS

## 2021-06-01 MED ORDER — ACETAMINOPHEN 500 MG PO TABS
1000.0000 mg | ORAL_TABLET | Freq: Once | ORAL | Status: AC
  Administered 2021-06-01: 1000 mg via ORAL
  Filled 2021-06-01: qty 2

## 2021-06-01 SURGICAL SUPPLY — 63 items
ATTUNE PS FEM LT SZ 6 CEM KNEE (Femur) ×1 IMPLANT
ATTUNE PSRP INSR SZ6 6 KNEE (Insert) ×1 IMPLANT
BAG COUNTER SPONGE SURGICOUNT (BAG) ×3 IMPLANT
BAG DECANTER FOR FLEXI CONT (MISCELLANEOUS) ×4 IMPLANT
BAG SPEC THK2 15X12 ZIP CLS (MISCELLANEOUS) ×1
BAG SPNG CNTER NS LX DISP (BAG) ×1
BAG ZIPLOCK 12X15 (MISCELLANEOUS) ×3 IMPLANT
BASE TIBIA ATTUNE KNEE SYS SZ6 (Knees) IMPLANT
BLADE SAGITTAL 25.0X1.19X90 (BLADE) ×3 IMPLANT
BLADE SAW SGTL 13X75X1.27 (BLADE) ×3 IMPLANT
BLADE SURG 15 STRL LF DISP TIS (BLADE) ×2 IMPLANT
BLADE SURG 15 STRL SS (BLADE)
BLADE SURG SZ10 CARB STEEL (BLADE) ×6 IMPLANT
BNDG CMPR MED 10X6 ELC LF (GAUZE/BANDAGES/DRESSINGS) ×1
BNDG CMPR MED 15X6 ELC VLCR LF (GAUZE/BANDAGES/DRESSINGS) ×1
BNDG ELASTIC 6X10 VLCR STRL LF (GAUZE/BANDAGES/DRESSINGS) ×1 IMPLANT
BNDG ELASTIC 6X15 VLCR STRL LF (GAUZE/BANDAGES/DRESSINGS) ×3 IMPLANT
BOWL SMART MIX CTS (DISPOSABLE) ×3 IMPLANT
BSPLAT TIB 6 CMNT ROT PLAT STR (Knees) ×1 IMPLANT
CEMENT HV SMART SET (Cement) ×2 IMPLANT
CLSR STERI-STRIP ANTIMIC 1/2X4 (GAUZE/BANDAGES/DRESSINGS) ×6 IMPLANT
COVER SURGICAL LIGHT HANDLE (MISCELLANEOUS) ×3 IMPLANT
CUFF TOURN SGL QUICK 34 (TOURNIQUET CUFF) ×2
CUFF TRNQT CYL 34X4.125X (TOURNIQUET CUFF) ×2 IMPLANT
DRAPE INCISE IOBAN 66X45 STRL (DRAPES) ×3 IMPLANT
DRAPE U-SHAPE 47X51 STRL (DRAPES) ×3 IMPLANT
DRESSING AQUACEL AG SP 3.5X10 (GAUZE/BANDAGES/DRESSINGS) ×2 IMPLANT
DRSG AQUACEL AG ADV 3.5X10 (GAUZE/BANDAGES/DRESSINGS) ×1 IMPLANT
DRSG AQUACEL AG SP 3.5X10 (GAUZE/BANDAGES/DRESSINGS) ×2
DURAPREP 26ML APPLICATOR (WOUND CARE) ×6 IMPLANT
ELECT REM PT RETURN 15FT ADLT (MISCELLANEOUS) ×3 IMPLANT
GLOVE SRG 8 PF TXTR STRL LF DI (GLOVE) ×4 IMPLANT
GLOVE SURG ORTHO LTX SZ8 (GLOVE) ×3 IMPLANT
GLOVE SURG POLYISO LF SZ7.5 (GLOVE) ×3 IMPLANT
GLOVE SURG UNDER POLY LF SZ8 (GLOVE) ×4
GOWN STRL REUS W/TWL XL LVL3 (GOWN DISPOSABLE) ×6 IMPLANT
HANDPIECE INTERPULSE COAX TIP (DISPOSABLE) ×2
HOLDER FOLEY CATH W/STRAP (MISCELLANEOUS) ×1 IMPLANT
HOOD PEEL AWAY FLYTE STAYCOOL (MISCELLANEOUS) ×3 IMPLANT
IMMOBILIZER KNEE 20 (SOFTGOODS) ×2
IMMOBILIZER KNEE 20 THIGH 36 (SOFTGOODS) ×2 IMPLANT
KIT TURNOVER KIT A (KITS) ×1 IMPLANT
MANIFOLD NEPTUNE II (INSTRUMENTS) ×3 IMPLANT
NEEDLE HYPO 22GX1.5 SAFETY (NEEDLE) ×6 IMPLANT
NS IRRIG 1000ML POUR BTL (IV SOLUTION) ×3 IMPLANT
PACK TOTAL KNEE CUSTOM (KITS) ×3 IMPLANT
PATELLA MEDIAL ATTUN 35MM KNEE (Knees) ×1 IMPLANT
PROTECTOR NERVE ULNAR (MISCELLANEOUS) ×3 IMPLANT
SET HNDPC FAN SPRY TIP SCT (DISPOSABLE) ×2 IMPLANT
SPIKE FLUID TRANSFER (MISCELLANEOUS) ×6 IMPLANT
SPONGE T-LAP 18X18 ~~LOC~~+RFID (SPONGE) ×9 IMPLANT
SUT ETHIBOND NAB CT1 #1 30IN (SUTURE) ×6 IMPLANT
SUT MNCRL AB 3-0 PS2 18 (SUTURE) ×3 IMPLANT
SUT VIC AB 0 CT1 36 (SUTURE) ×3 IMPLANT
SUT VIC AB 2-0 CT1 27 (SUTURE) ×4
SUT VIC AB 2-0 CT1 TAPERPNT 27 (SUTURE) ×4 IMPLANT
SYR CONTROL 10ML LL (SYRINGE) ×9 IMPLANT
TIBIA ATTUNE KNEE SYS BASE SZ6 (Knees) ×2 IMPLANT
TOWEL OR 17X26 10 PK STRL BLUE (TOWEL DISPOSABLE) ×3 IMPLANT
TRAY FOLEY MTR SLVR 16FR STAT (SET/KITS/TRAYS/PACK) ×3 IMPLANT
TUBE SUCTION HIGH CAP CLEAR NV (SUCTIONS) ×3 IMPLANT
WATER STERILE IRR 1000ML POUR (IV SOLUTION) ×6 IMPLANT
WRAP KNEE MAXI GEL POST OP (GAUZE/BANDAGES/DRESSINGS) ×3 IMPLANT

## 2021-06-01 NOTE — Anesthesia Postprocedure Evaluation (Signed)
Anesthesia Post Note  Patient: Riley Larson  Procedure(s) Performed: TOTAL KNEE ARTHROPLASTY (Left: Knee)     Patient location during evaluation: PACU Anesthesia Type: Spinal Level of consciousness: awake and alert, patient cooperative and oriented Pain management: pain level controlled Vital Signs Assessment: post-procedure vital signs reviewed and stable Respiratory status: spontaneous breathing, nonlabored ventilation and respiratory function stable Cardiovascular status: blood pressure returned to baseline and stable Postop Assessment: spinal receding, patient able to bend at knees, no apparent nausea or vomiting, no headache and adequate PO intake Anesthetic complications: no   No notable events documented.  Last Vitals:  Vitals:   06/01/21 1200 06/01/21 1224  BP: (!) 142/93 (!) 147/80  Pulse: (!) 58 70  Resp: 18 16  Temp: (!) 36 C (!) 36.4 C  SpO2: 100% 100%    Last Pain:  Vitals:   06/01/21 1224  TempSrc:   PainSc: 0-No pain                 Georgian Mcclory,E. Marche Hottenstein

## 2021-06-01 NOTE — Discharge Instructions (Signed)

## 2021-06-01 NOTE — Evaluation (Signed)
Physical Therapy Evaluation Patient Details Name: Riley Larson MRN: [id number] DOB: [birth year] Today's Date: 06/01/2021  History of Present Illness  Pt s/p L TKR and with hx of R TKR  Clinical Impression  Pt s/p L TKR and presents with decreased L LE strength/ROM and post op pain limiting functional mobility.  This date, pt performed limited HEP with written instruction provided.  Pt up to ambulate 150' in hall with RW and negotiated step up to access house.  Pt eager for dc home this date and reports first OP PT scheduled for 06/04/21.     Recommendations for follow up therapy are one component of a multi-disciplinary discharge planning process, led by the attending physician.  Recommendations may be updated based on patient status, additional functional criteria and insurance authorization.  Follow Up Recommendations Outpatient PT    Assistance Recommended at Discharge Intermittent Supervision/Assistance  Patient can return home with the following  A little help with walking and/or transfers;A lot of help with bathing/dressing/bathroom;Assistance with cooking/housework;Assist for transportation;Help with stairs or ramp for entrance    Equipment Recommendations None recommended by PT  Recommendations for Other Services       Functional Status Assessment Patient has had a recent decline in their functional status and demonstrates the ability to make significant improvements in function in a reasonable and predictable amount of time.     Precautions / Restrictions Precautions Precautions: Knee;Fall Required Braces or Orthoses: Knee Immobilizer - Left Knee Immobilizer - Left: Discontinue once straight leg raise with < 10 degree lag (pt performed IND SLR this date) Restrictions Weight Bearing Restrictions: No Other Position/Activity Restrictions: WBAT      Mobility  Bed Mobility Overal bed mobility: Modified Independent             General bed mobility comments: No  physical assist    Transfers Overall transfer level: Needs assistance Equipment used: Rolling walker (2 wheels) Transfers: Sit to/from Stand Sit to Stand: Min guard           General transfer comment: Steady assist with min cues for LE management and use of UEs to self assist    Ambulation/Gait Ambulation/Gait assistance: Min guard Gait Distance (Feet): 150 Feet Assistive device: Rolling walker (2 wheels) Gait Pattern/deviations: Step-to pattern, Step-through pattern, Shuffle, Trunk flexed       General Gait Details: cues for posture, position from RW, initial sequence, and for safety awareness on turns  Stairs Stairs: Yes Stairs assistance: Min assist Stair Management: No rails, Step to pattern, Backwards, Forwards, With walker Number of Stairs: 2 General stair comments: single step twice - once fwd and once bkwd; cues for sequence  Wheelchair Mobility    Modified Rankin (Stroke Patients Only)       Balance Overall balance assessment: Needs assistance Sitting-balance support: No upper extremity supported, Feet supported Sitting balance-Leahy Scale: Good     Standing balance support: No upper extremity supported Standing balance-Leahy Scale: Fair Standing balance comment: pt dons mask in standing using both hands                             Pertinent Vitals/Pain Pain Assessment Pain Assessment: 0-10 Pain Score: 7  Pain Location: L knee Pain Descriptors / Indicators: Aching, Sore Pain Intervention(s): Limited activity within patient's tolerance, Monitored during session, Patient requesting pain meds-RN notified    Home Living Family/patient expects to be discharged to:: Private residence Living Arrangements: Spouse/significant other;Children Available Help at  Discharge: Family Type of Home: House Home Access: Stairs to enter   Technical brewer of Steps: 1   Home Layout: One level Home Equipment: Conservation officer, nature (2 wheels);BSC/3in1       Prior Function Prior Level of Function : Independent/Modified Independent                     Hand Dominance        Extremity/Trunk Assessment   Upper Extremity Assessment Upper Extremity Assessment: Overall WFL for tasks assessed    Lower Extremity Assessment Lower Extremity Assessment: LLE deficits/detail LLE Deficits / Details: 3/5 quads with IND SLR; AROM L knee 0 - 80    Cervical / Trunk Assessment Cervical / Trunk Assessment: Normal  Communication   Communication: No difficulties  Cognition Arousal/Alertness: Awake/alert Behavior During Therapy: WFL for tasks assessed/performed Overall Cognitive Status: Within Functional Limits for tasks assessed                                          General Comments      Exercises Total Joint Exercises Ankle Circles/Pumps: AROM, Both, 15 reps, Supine Quad Sets: AROM, Both, 5 reps, Supine Heel Slides: AROM, Left, 5 reps, Supine Straight Leg Raises: AROM, Left, 10 reps, Supine   Assessment/Plan    PT Assessment Patient needs continued PT services  PT Problem List Decreased strength;Decreased range of motion;Decreased activity tolerance;Decreased balance;Decreased mobility;Decreased knowledge of use of DME;Pain       PT Treatment Interventions DME instruction;Gait training;Stair training;Functional mobility training;Therapeutic activities;Therapeutic exercise;Patient/family education    PT Goals (Current goals can be found in the Care Plan section)  Acute Rehab PT Goals Patient Stated Goal: Regain IND PT Goal Formulation: All assessment and education complete, DC therapy    Frequency Min 1X/week     Co-evaluation               AM-PAC PT "6 Clicks" Mobility  Outcome Measure Help needed turning from your back to your side while in a flat bed without using bedrails?: A Little Help needed moving from lying on your back to sitting on the side of a flat bed without using bedrails?: A  Little Help needed moving to and from a bed to a chair (including a wheelchair)?: A Little Help needed standing up from a chair using your arms (e.g., wheelchair or bedside chair)?: A Little Help needed to walk in hospital room?: A Little Help needed climbing 3-5 steps with a railing? : A Little 6 Click Score: 18    End of Session Equipment Utilized During Treatment: Gait belt;Left knee immobilizer Activity Tolerance: Patient tolerated treatment well Patient left: in chair;with call bell/phone within reach Nurse Communication: Mobility status PT Visit Diagnosis: Unsteadiness on feet (R26.81);Pain Pain - Right/Left: Left Pain - part of body: Knee    Time: 1300-1333 PT Time Calculation (min) (ACUTE ONLY): 33 min   Charges:   PT Evaluation $PT Eval Low Complexity: 1 Low PT Treatments $Gait Training: 8-22 mins        Debe Coder PT Acute Rehabilitation Services Pager [phone] Office [phone]   Darol Cush 06/01/2021, 1:44 PM

## 2021-06-01 NOTE — Anesthesia Procedure Notes (Signed)
Anesthesia Regional Block: Adductor canal block   Pre-Anesthetic Checklist: , timeout performed,  Correct Patient, Correct Site, Correct Laterality,  Correct Procedure, Correct Position, site marked,  Risks and benefits discussed,  Surgical consent,  Pre-op evaluation,  At surgeon's request and post-op pain management  Laterality: Left and Lower  Prep: chloraprep       Needles:  Injection technique: Single-shot  Needle Type: Echogenic Needle     Needle Length: 9cm  Needle Gauge: 21     Additional Needles:   Procedures:,,,, ultrasound used (permanent image in chart),,    Narrative:  Start time: 06/01/2021 6:54 AM End time: 06/01/2021 7:00 AM Injection made incrementally with aspirations every 5 mL.  Performed by: Personally  Anesthesiologist: Annye Asa, MD  Additional Notes: Pt identified in Holding room.  Monitors applied. Working IV access confirmed. Sterile prep L thigh.  #21ga ECHOgenic Arrow block needle into adductor canal with US guidance.  20cc 0.75% Ropivacaine injected incrementally after negative test dose.  Patient asymptomatic, VSS, no heme aspirated, tolerated well.   Jenita Seashore, MD

## 2021-06-01 NOTE — Op Note (Signed)
NAME: Riley Larson, Riley Larson MEDICAL RECORD NO: [id number] ACCOUNT NO: [id number] DATE OF BIRTH: [birth year] FACILITY: [facility] LOCATION: [facility] PHYSICIAN: W D. Valeta Harms., MD  Operative Report   DATE OF PROCEDURE: 06/01/2021  PREOPERATIVE DIAGNOSIS:  Severe osteoarthritis with valgus deformity, left knee.  POSTOPERATIVE DIAGNOSIS:  Severe osteoarthritis with valgus deformity, left knee.  PROCEDURE:  Left total knee replacement (Attune cemented knee size 6 femur, size 6 tibia, 6 mm bearing with 35 mm all poly patella).  SURGEON:  W D. Valeta Harms., MD  ASSISTANTMarjo Bicker.  ANESTHESIA:  Spinal with block.  TOURNIQUET TIME:  60 minutes.  DESCRIPTION OF PROCEDURE:  Straight skin incision was made with a medial parapatellar approach to the knee.  We did a 10 mm 5-degree valgus cut on the femur.  We then cut 3 mm below the most diseased lateral compartment with the extension gap, eventually  be and measured at 6 mm.  Femur was sized to be a size 6.  Placement of the all-in-1 cutting block in the appropriate degree of external rotation, accomplishing the anterior, posterior and chamfer cuts.  PCL was released.  Small osteophytes were removed  from the posterior aspect of the knee.  The capsular and subcutaneous tissues were infiltrated with a mixture of Exparel and Marcaine.  Tibia was sized to be a size 6, placing the tibial cutting block, cutting the keel cut for the tibia.  The box cut  was next made for the femur.  Patella was cut, resecting 9.5 mm.  All trials were placed.  Full extension was noted, resolution of the valgus deformity.  Cement was prepared on the back table and inserted in the doughy state. Tibia followed by femur and  patella.  Trial bearing was placed.  Cement was allowed to harden.  After the cement hardened, the trial bearing was removed under direct vision.  The tourniquet was released. Prior to this small portions of cement were removed from the posterior aspect  of the  knee.  No excessive bleeding was noted.  Small bleeders were coagulated.  Final bearing was placed.  Closure was affected with #1 Ethibond, 2-0 Vicryl and Monocryl in the skin.  Taken to recovery room in stable condition.   PUS D: 06/01/2021 9:29:55 am T: 06/01/2021 9:45:00 am  JOB: [id number]/ [id number]

## 2021-06-01 NOTE — Transfer of Care (Signed)
Immediate Anesthesia Transfer of Care Note  Patient: Riley Larson  Procedure(s) Performed: Procedure(s): TOTAL KNEE ARTHROPLASTY (Left)  Patient Location: PACU  Anesthesia Type:MAC, Regional and Spinal  Level of Consciousness: Patient easily awoken, sedated, comfortable, cooperative, following commands, responds to stimulation.   Airway & Oxygen Therapy: Patient spontaneously breathing, ventilating well, oxygen via simple oxygen mask.  Post-op Assessment: Report given to PACU RN, vital signs reviewed and stable.  Post vital signs: Reviewed and stable.  Complications: No apparent anesthesia complications  Last Vitals:  Vitals Value Taken Time  BP 134/83 06/01/21 1003  Temp    Pulse 58 06/01/21 1004  Resp 16 06/01/21 1004  SpO2 100 % 06/01/21 1004  Vitals shown include unvalidated device data.  Last Pain:  Vitals:   06/01/21 0602  TempSrc:   PainSc: 0-No pain         Complications: No notable events documented.

## 2021-06-01 NOTE — Anesthesia Procedure Notes (Signed)
Spinal  Patient location during procedure: OR End time: 06/01/2021 7:48 AM Reason for block: surgical anesthesia Staffing Performed: anesthesiologist  Anesthesiologist: Annye Asa, MD Preanesthetic Checklist Completed: patient identified, IV checked, site marked, risks and benefits discussed, surgical consent, monitors and equipment checked, pre-op evaluation and timeout performed Spinal Block Patient position: sitting Prep: DuraPrep and site prepped and draped Patient monitoring: blood pressure, continuous pulse ox, cardiac monitor and heart rate Approach: midline Location: L3-4 Injection technique: single-shot Needle Needle type: Pencan and Introducer  Needle gauge: 24 G Needle length: 9 cm Assessment Events: CSF return Additional Notes Pt identified in Operating room.  Monitors applied. Working IV access confirmed. Sterile prep, drape lumbar spine.  1% lido local L 3,4.  #24ga Pencan into clear CSF L 3,4.  15mg  0.75% Bupivacaine with dextrose injected with asp CSF beginning and end of injection.  Patient asymptomatic, VSS, no heme aspirated, tolerated well.  Jenita Seashore, MD

## 2021-06-01 NOTE — Brief Op Note (Signed)
06/01/2021  10:05 AM  PATIENT:  Riley Larson  51 y.o. male  PRE-OPERATIVE DIAGNOSIS:  OA LEFT KNEE  POST-OPERATIVE DIAGNOSIS:  OA LEFT KNEE  PROCEDURE:  Procedure(s): TOTAL KNEE ARTHROPLASTY (Left)  SURGEON:  Surgeon(s) and Role:    Earlie Server, MD - Primary  PHYSICIAN ASSISTANT: Chriss Czar, PA-C  ASSISTANTS: OR staff x1   ANESTHESIA:   local, regional, spinal, and IV sedation  EBL:  50 mL   BLOOD ADMINISTERED:none  DRAINS: none   LOCAL MEDICATIONS USED:  MARCAINE     SPECIMEN:  No Specimen  DISPOSITION OF SPECIMEN:  N/A  COUNTS:  YES  TOURNIQUET:   Total Tourniquet Time Documented: Thigh (Left) - 64 minutes Total: Thigh (Left) - 64 minutes   DICTATION: .Other Dictation: Dictation Number unknown  PLAN OF CARE: Discharge to home after PACU  PATIENT DISPOSITION:  PACU - hemodynamically stable.   Delay start of Pharmacological VTE agent (>24hrs) due to surgical blood loss or risk of bleeding: yes

## 2021-06-01 NOTE — Progress Notes (Signed)
Orthopedic Tech Progress Note Patient Details:  Riley Larson [birth year] [id number]  Ortho Devices Type of Ortho Device: Bone foam zero knee Ortho Device/Splint Location: Left knee Ortho Device/Splint Interventions: Application   Post Interventions Patient Tolerated: Well  Linus Salmons Osmel Dykstra 06/01/2021, 10:35 AM

## 2021-06-01 NOTE — Anesthesia Procedure Notes (Signed)
Procedure Name: MAC Date/Time: 06/01/2021 7:43 AM Performed by: Deliah Boston, CRNA Pre-anesthesia Checklist: Patient identified, Emergency Drugs available, Suction available and Patient being monitored Patient Re-evaluated:Patient Re-evaluated prior to induction Oxygen Delivery Method: Simple face mask Preoxygenation: Pre-oxygenation with 100% oxygen Placement Confirmation: positive ETCO2 and breath sounds checked- equal and bilateral

## 2021-06-01 NOTE — Interval H&P Note (Signed)
History and Physical Interval Note:  06/01/2021 7:34 AM  Riley Larson  has presented today for surgery, with the diagnosis of OA LEFT KNEE.  The various methods of treatment have been discussed with the patient and family. After consideration of risks, benefits and other options for treatment, the patient has consented to  Procedure(s): TOTAL KNEE ARTHROPLASTY (Left) as a surgical intervention.  The patient's history has been reviewed, patient examined, no change in status, stable for surgery.  I have reviewed the patient's chart and labs.  Questions were answered to the patient's satisfaction.     Yvette Rack

## 2021-06-04 ENCOUNTER — Other Ambulatory Visit: Payer: Self-pay

## 2021-06-04 ENCOUNTER — Encounter (HOSPITAL_COMMUNITY): Payer: Self-pay | Admitting: Physical Therapy

## 2021-06-04 ENCOUNTER — Ambulatory Visit (HOSPITAL_COMMUNITY): Payer: BC Managed Care – PPO | Attending: Orthopedic Surgery | Admitting: Physical Therapy

## 2021-06-04 DIAGNOSIS — M25662 Stiffness of left knee, not elsewhere classified: Secondary | ICD-10-CM | POA: Diagnosis not present

## 2021-06-04 DIAGNOSIS — R29898 Other symptoms and signs involving the musculoskeletal system: Secondary | ICD-10-CM | POA: Diagnosis present

## 2021-06-04 DIAGNOSIS — M25562 Pain in left knee: Secondary | ICD-10-CM | POA: Insufficient documentation

## 2021-06-04 DIAGNOSIS — M6281 Muscle weakness (generalized): Secondary | ICD-10-CM | POA: Insufficient documentation

## 2021-06-04 DIAGNOSIS — R2689 Other abnormalities of gait and mobility: Secondary | ICD-10-CM | POA: Insufficient documentation

## 2021-06-04 NOTE — Patient Instructions (Signed)
Access Code: [email or web address] URL: [email or web address] Date: 06/04/2021 Prepared by: Mitzi Hansen Saket Hellstrom  Exercises Supine Ankle Pumps - 3 x daily - 7 x weekly - 2 sets - 20 reps Supine Quadricep Sets - 3 x daily - 7 x weekly - 10 reps - 10 second holds hold Supine Heel Slide with Strap - 3 x daily - 7 x weekly - 10 reps - 10 second hold Seated Heel Slide - 3 x daily - 7 x weekly - 10 reps - 10 second hold

## 2021-06-04 NOTE — Therapy (Signed)
[facility] [address] [place], [place], [address] Phone: [phone]   Fax:  [phone]  Physical Therapy Evaluation  Patient Details  Name: Riley Larson MRN: [id number] Date of Birth: 09/10/1970 Referring Provider (PT): Earlie Server MD   Encounter Date: 06/04/2021   PT End of Session - 06/04/21 0911     Visit Number 1    Number of Visits 12    Date for PT Re-Evaluation 07/16/21    Authorization Type Primary BCBS Comm PPO secondary: [place] focus    Progress Note Due on Visit 10    PT Start Time 0831    PT Stop Time 0910    PT Time Calculation (min) 39 min    Activity Tolerance Patient tolerated treatment well    Behavior During Therapy [facility] for tasks assessed/performed             Past Medical History:  Diagnosis Date   Arthritis    Hypertension     Past Surgical History:  Procedure Laterality Date   BACK SURGERY     COLONOSCOPY WITH PROPOFOL N/A 03/14/2020   Procedure: COLONOSCOPY WITH PROPOFOL;  Surgeon: Harvel Quale, MD;  [facility];  Service: Gastroenterology;  Laterality: N/A;  9:15   LAPAROSCOPIC GASTRIC BANDING     left index finger surgery      TOTAL KNEE ARTHROPLASTY Right 03/09/2021   Procedure: TOTAL KNEE ARTHROPLASTY;  Surgeon: Earlie Server, MD;  Location: [facility];  Service: Orthopedics;  Laterality: Right;    There were no vitals filed for this visit.    Subjective Assessment - 06/04/21 0833     Subjective Patient is a 51 y.o. male who presents to physical therapy s/p L TKA on 06/01/21. His knee is really sore and throbbing. He is using RW. He Had R TKA on 03/09/21. Was getting around well and joined the [facility] after d/c to ride bike. Unsteady with walking. He gets hip and back pain from limping. His main goal is to get back to normal.    Limitations Standing;Walking;House hold activities;Lifting    Patient Stated Goals get back to normal    Currently in Pain? Yes     Pain Score 9     Pain Location Knee    Pain Orientation Left    Pain Descriptors / Indicators Sore    Pain Type Surgical pain    Pain Onset In the past 7 days    Pain Frequency Constant                [facility] PT Assessment - 06/04/21 0001       Assessment   Medical Diagnosis L TKA    Referring Provider (PT) Earlie Server MD    Onset Date/Surgical Date 06/01/21    Prior Therapy R TKA      Precautions   Precautions None      Restrictions   Weight Bearing Restrictions No      Balance Screen   Has the patient fallen in the past 6 months No    Has the patient had a decrease in activity level because of a fear of falling?  No    Is the patient reluctant to leave their home because of a fear of falling?  No      Prior Function   Level of Independence Independent    Vocation Full time employment    Vocation Requirements [employer]      Cognition  Overall Cognitive Status Within Functional Limits for tasks assessed      Observation/Other Assessments   Observations Ambulates with RW, ace wrap on L LE    Focus on Therapeutic Outcomes (FOTO)  20% function      AROM   Right/Left Knee Right;Left    Right Knee Extension 0    Right Knee Flexion 122    Left Knee Extension 9   lacking   Left Knee Flexion 71   in supine     Strength   Right/Left Hip Right;Left    Right/Left Knee Right;Left    Right Knee Flexion 5/5    Right Knee Extension 5/5    Left Knee Flexion 4-/5    Left Knee Extension 3-/5    Right/Left Ankle Right;Left    Right Ankle Dorsiflexion 5/5    Left Ankle Dorsiflexion 4+/5      Transfers   Comments labored, relies on RLE      Ambulation/Gait   Ambulation/Gait Yes    Ambulation/Gait Assistance 6: Modified independent (Device/Increase time)    Ambulation Distance (Feet) 100 Feet    Gait Pattern Antalgic;Left flexed knee in stance    Ambulation Surface Level;Indoor                        Objective measurements completed on  examination: See above findings.       [facility] Adult PT Treatment/Exercise - 06/04/21 0001       Knee/Hip Exercises: Supine   Quad Sets 10 reps    Quad Sets Limitations 10 second holds    Heel Slides Left;10 reps    Heel Slides Limitations 10 second    Other Supine Knee/Hip Exercises ankle pumps x 20                     PT Education - 06/04/21 0832     Education Details Patient educated on exam findings, POC, scope of PT, HEP, compression garments    Person(s) Educated Patient    Methods Explanation;Demonstration;Handout    Comprehension Verbalized understanding;Returned demonstration              PT Short Term Goals - 06/04/21 0913       PT SHORT TERM GOAL #1   Title Patient will report understanding and regular compliance with HEP to improve mobility and decrease pain.    Time 3    Period Weeks    Status New    Target Date 06/25/21      PT SHORT TERM GOAL #2   Title Demonstrate right knee extension 0 degrees and flexion 90 degrees to improve gait mechanics    Time 3    Period Weeks    Status New    Target Date 06/25/21               PT Long Term Goals - 06/04/21 0914       PT LONG TERM GOAL #1   Title Patient will report an improvement of at least 75% in overall symptoms  to improve QOL.    Time 6    Period Weeks    Status New    Target Date 07/16/21      PT LONG TERM GOAL #2   Title Ambulate independently with normalized pattern and stairs with reciprocal pattern    Time 6    Period Weeks    Status New    Target Date 07/16/21      PT  LONG TERM GOAL #3   Title Patient will improve FOTO score by at least 30 points in order to indicate improved tolerance to activity.    Time 6    Period Weeks    Status New    Target Date 07/16/21      PT LONG TERM GOAL #4   Title Patient will improve ROM for left knee extension/flexion to 0-115 degrees to improve squatting, and other functional mobility.    Time 6    Period Weeks    Status New     Target Date 07/16/21                    Plan - 06/04/21 0912     Clinical Impression Statement Patient is a 51 y.o. male who presents to physical therapy s/p L TKA on 06/01/21. He presents with pain limited deficits in L knee strength, ROM, endurance, gait, balance, edema and functional mobility with ADL. He is having to modify and restrict ADL as indicated by FOTO score as well as subjective information and objective measures which is affecting overall participation. Patient will benefit from skilled physical therapy in order to improve function and reduce impairment.    Personal Factors and Comorbidities Profession    Examination-Activity Limitations Bend;Carry;Lift;Toileting;Stairs;Squat;Sleep;Locomotion Level;Transfers    Examination-Participation Restrictions Cleaning;Community Activity;Occupation    Stability/Clinical Decision Making Stable/Uncomplicated    Clinical Decision Making Low    Rehab Potential Excellent    PT Frequency 2x / week    PT Duration 6 weeks    PT Treatment/Interventions ADLs/Self Care Home Management;Electrical Stimulation;DME Instruction;Gait training;Functional mobility training;Therapeutic activities;Therapeutic exercise;Balance training;Neuromuscular re-education;Patient/family education;Passive range of motion;Splinting;Taping;Dry needling;Energy conservation;Compression bandaging;Manual techniques;Orthotic Fit/Training;Stair training;Cryotherapy    PT Next Visit Plan f/u with HEP and compression garments, L knee mobility, quad strength, progress as tolerated    PT Home Exercise Plan quad set, heel slides supine and seated, ankle pumps    Consulted and Agree with Plan of Care Patient             Patient will benefit from skilled therapeutic intervention in order to improve the following deficits and impairments:  Abnormal gait, Decreased activity tolerance, Decreased mobility, Decreased knowledge of use of DME, Decreased range of motion,  Decreased strength, Increased edema, Difficulty walking, Impaired perceived functional ability, Pain  Visit Diagnosis: Stiffness of left knee, not elsewhere classified  Left knee pain, unspecified chronicity  Muscle weakness (generalized)  Other abnormalities of gait and mobility  Other symptoms and signs involving the musculoskeletal system     Problem List Patient Active Problem List   Diagnosis Date Noted   CELLULITIS, LEFT HAND 10/16/2009    9:16 AM, 06/04/21 Mearl Latin PT, DPT Physical Therapist at [facility] [place], [place], [address] Phone: [phone]   Fax:  [phone]  Name: Riley Larson MRN: [id number] Date of Birth: 1970-12-24

## 2021-06-06 ENCOUNTER — Encounter (HOSPITAL_COMMUNITY): Payer: BC Managed Care – PPO

## 2021-06-08 ENCOUNTER — Ambulatory Visit (HOSPITAL_COMMUNITY): Payer: BC Managed Care – PPO | Attending: Orthopedic Surgery

## 2021-06-08 ENCOUNTER — Other Ambulatory Visit: Payer: Self-pay

## 2021-06-08 ENCOUNTER — Encounter (HOSPITAL_COMMUNITY): Payer: Self-pay

## 2021-06-08 DIAGNOSIS — M25562 Pain in left knee: Secondary | ICD-10-CM | POA: Insufficient documentation

## 2021-06-08 DIAGNOSIS — M6281 Muscle weakness (generalized): Secondary | ICD-10-CM | POA: Diagnosis present

## 2021-06-08 DIAGNOSIS — R2689 Other abnormalities of gait and mobility: Secondary | ICD-10-CM | POA: Insufficient documentation

## 2021-06-08 DIAGNOSIS — R29898 Other symptoms and signs involving the musculoskeletal system: Secondary | ICD-10-CM | POA: Insufficient documentation

## 2021-06-08 DIAGNOSIS — M25662 Stiffness of left knee, not elsewhere classified: Secondary | ICD-10-CM | POA: Insufficient documentation

## 2021-06-08 NOTE — Therapy (Signed)
[facility] ?[address] ?[place], [place], [address] ?Phone: [phone]   Fax:  [phone] ? ?Physical Therapy Treatment ? ?Patient Details  ?Name: Riley Larson ?MRN: [id number] ?Date of Birth: 08-Oct-1970 ?Referring Provider (PT): Earlie Server MD ? ? ?Encounter Date: 06/08/2021 ? ? PT End of Session - 06/08/21 1010   ? ? Visit Number 2   ? Number of Visits 12   ? Date for PT Re-Evaluation 07/16/21   ? Authorization Type Primary BCBS Comm PPO secondary: [place] focus   ? Progress Note Due on Visit 10   ? PT Start Time 1004   ? PT Stop Time 1046   ? PT Time Calculation (min) 42 min   ? Activity Tolerance Patient limited by pain;Patient tolerated treatment well;No increased pain   ? Behavior During Therapy [facility] for tasks assessed/performed   ? ?  ?  ? ?  ? ? ?Past Medical History:  ?Diagnosis Date  ? Arthritis   ? Hypertension   ? ? ?Past Surgical History:  ?Procedure Laterality Date  ? BACK SURGERY    ? COLONOSCOPY WITH PROPOFOL N/A 03/14/2020  ? Procedure: COLONOSCOPY WITH PROPOFOL;  Surgeon: Harvel Quale, MD;  [facility];  Service: Gastroenterology;  Laterality: N/A;  9:15  ? LAPAROSCOPIC GASTRIC BANDING    ? left index finger surgery     ? TOTAL KNEE ARTHROPLASTY Right 03/09/2021  ? Procedure: TOTAL KNEE ARTHROPLASTY;  Surgeon: Earlie Server, MD;  [facility];  Service: Orthopedics;  Laterality: Right;  ? TOTAL KNEE ARTHROPLASTY Left 06/01/2021  ? Procedure: TOTAL KNEE ARTHROPLASTY;  Surgeon: Earlie Server, MD;  Location[facility];  Service: Orthopedics;  Laterality: Left;  ? ? ?There were no vitals filed for this visit. ? ? Subjective Assessment - 06/08/21 1007   ? ? Subjective Pt stated Lt knee is sore today, pain scale 7/10 today.  Has began HEP and riding CPM daily.   ? Patient Stated Goals get back to normal   ? Currently in Pain? Yes   ? Pain Score 7    ? Pain Location Knee   ? Pain Orientation Left   ? Pain Descriptors /  Indicators Sore   ? Pain Type Surgical pain   ? Pain Onset In the past 7 days   ? Pain Frequency Constant   ? Aggravating Factors  weight bearing, bending, laying still at night   ? ?  ?  ? ?  ? ? ? ? ? ? ? ? ? ? ? ? ? ? ? ? ? ? ? ? [facility] Adult PT Treatment/Exercise - 06/08/21 0001   ? ?  ? Knee/Hip Exercises: Stretches  ? Knee: Self-Stretch to increase Flexion Right;5 reps;10 seconds   ? Knee: Self-Stretch Limitations knee drive on 17PZ step height   ?  ? Knee/Hip Exercises: Aerobic  ? Recumbent Bike 78min seat 16 rocking   ?  ? Knee/Hip Exercises: Standing  ? Heel Raises 15 reps;3 seconds   ? Terminal Knee Extension 10 reps;Theraband   ? Theraband Level (Terminal Knee Extension) Level 3 (Green)   ? Terminal Knee Extension Limitations 5" holds   ?  ? Knee/Hip Exercises: Seated  ? Heel Slides 5 reps   ?  ? Knee/Hip Exercises: Supine  ? Quad Sets 10 reps   ? Quad Sets Limitations --   10" holds  ? Short Arc [employer] 10 reps;Left   ? Heel Slides Left;10 reps   ?  Heel Slides Limitations 10 second   ? Straight Leg Raises Left;2 sets;5 reps   ? Knee Extension AROM;Left   ? Knee Extension Limitations 9   ? Knee Flexion AROM;Left   ? Knee Flexion Limitations 82 degrees   ?  ? Manual Therapy  ? Manual Therapy Soft tissue mobilization;Edema management   ? Manual therapy comments All manual completed separately from other skilled interventions   ? Edema Management Decongestive techniques with Lt LE elevated   ? Soft tissue mobilization Lt quad gentle   ? ?  ?  ? ?  ? ? ? ? ? ? ? ? ? ? PT Education - 06/08/21 1014   ? ? Education Details Reviewed goals and importance of HEP compliance, RICE techqniues for pain and edema control.   ? Person(s) Educated Patient   ? Methods Explanation;Demonstration   ? Comprehension Verbalized understanding;Returned demonstration   ? ?  ?  ? ?  ? ? ? PT Short Term Goals - 06/04/21 0913   ? ?  ? PT SHORT TERM GOAL #1  ? Title Patient will report understanding and regular compliance with HEP to  improve mobility and decrease pain.   ? Time 3   ? Period Weeks   ? Status New   ? Target Date 06/25/21   ?  ? PT SHORT TERM GOAL #2  ? Title Demonstrate right knee extension 0 degrees and flexion 90 degrees to improve gait mechanics   ? Time 3   ? Period Weeks   ? Status New   ? Target Date 06/25/21   ? ?  ?  ? ?  ? ? ? ? PT Long Term Goals - 06/04/21 0914   ? ?  ? PT LONG TERM GOAL #1  ? Title Patient will report an improvement of at least 75% in overall symptoms  to improve QOL.   ? Time 6   ? Period Weeks   ? Status New   ? Target Date 07/16/21   ?  ? PT LONG TERM GOAL #2  ? Title Ambulate independently with normalized pattern and stairs with reciprocal pattern   ? Time 6   ? Period Weeks   ? Status New   ? Target Date 07/16/21   ?  ? PT LONG TERM GOAL #3  ? Title Patient will improve FOTO score by at least 30 points in order to indicate improved tolerance to activity.   ? Time 6   ? Period Weeks   ? Status New   ? Target Date 07/16/21   ?  ? PT LONG TERM GOAL #4  ? Title Patient will improve ROM for left knee extension/flexion to 0-115 degrees to improve squatting, and other functional mobility.   ? Time 6   ? Period Weeks   ? Status New   ? Target Date 07/16/21   ? ?  ?  ? ?  ? ? ? ? ? ? ? ? Plan - 06/08/21 1014   ? ? Clinical Impression Statement Reviewed goals and importance of HEP compliance for maximal benefits.  Session focus on knee mobility and pain control.  Reviewed RICE techqiues for pain and edema control.  Pt c/o increased edema in quadriceps and proximal knee, manual decongestive techniques complete for pain and edema control with reports of relief following.  Improved AROM 9-82 degrees today.  EOS rocking on bike, seat 16 for mobility.   ? Personal Factors and Comorbidities Profession   ? Examination-Activity  Limitations Bend;Carry;Lift;Toileting;Stairs;Squat;Sleep;Locomotion Level;Transfers   ? Examination-Participation Restrictions Cleaning;Community Activity;Occupation   ? Stability/Clinical  Decision Making Stable/Uncomplicated   ? Clinical Decision Making Low   ? Rehab Potential Excellent   ? PT Frequency 2x / week   ? PT Duration 6 weeks   ? PT Treatment/Interventions ADLs/Self Care Home Management;Electrical Stimulation;DME Instruction;Gait training;Functional mobility training;Therapeutic activities;Therapeutic exercise;Balance training;Neuromuscular re-education;Patient/family education;Passive range of motion;Splinting;Taping;Dry needling;Energy conservation;Compression bandaging;Manual techniques;Orthotic Fit/Training;Stair training;Cryotherapy   ? PT Next Visit Plan f/u with HEP and compression garments, L knee mobility, quad strength, progress as tolerated   ? PT Home Exercise Plan quad set, heel slides supine and seated, ankle pumps   ? Consulted and Agree with Plan of Care Patient   ? ?  ?  ? ?  ? ? ?Patient will benefit from skilled therapeutic intervention in order to improve the following deficits and impairments:  Abnormal gait, Decreased activity tolerance, Decreased mobility, Decreased knowledge of use of DME, Decreased range of motion, Decreased strength, Increased edema, Difficulty walking, Impaired perceived functional ability, Pain ? ?Visit Diagnosis: ?Stiffness of left knee, not elsewhere classified ? ?Left knee pain, unspecified chronicity ? ?Muscle weakness (generalized) ? ?Other abnormalities of gait and mobility ? ?Other symptoms and signs involving the musculoskeletal system ? ? ? ? ?Problem List ?Patient Active Problem List  ? Diagnosis Date Noted  ? CELLULITIS, LEFT HAND 10/16/2009  ? ?Ihor Austin, LPTA/CLT; CBIS ?[phone] ? ?Aldona Lento, PTA ?06/08/2021, 12:01 PM ? ?[facility] ?[address] ?[place], [place], [address] ?Phone: [phone]   Fax:  [phone] ? ?Name: Riley Larson ?MRN: [id number] ?Date of Birth: 1970/05/20 ? ? ? ?

## 2021-06-11 ENCOUNTER — Encounter (HOSPITAL_COMMUNITY): Payer: BC Managed Care – PPO

## 2021-06-13 ENCOUNTER — Encounter (HOSPITAL_COMMUNITY): Payer: BC Managed Care – PPO | Admitting: Physical Therapy

## 2021-06-15 ENCOUNTER — Encounter (HOSPITAL_COMMUNITY): Payer: Self-pay

## 2021-06-15 ENCOUNTER — Ambulatory Visit (HOSPITAL_COMMUNITY): Payer: BC Managed Care – PPO

## 2021-06-15 ENCOUNTER — Encounter (HOSPITAL_COMMUNITY): Payer: BC Managed Care – PPO

## 2021-06-15 ENCOUNTER — Other Ambulatory Visit: Payer: Self-pay

## 2021-06-15 DIAGNOSIS — M25562 Pain in left knee: Secondary | ICD-10-CM

## 2021-06-15 DIAGNOSIS — R2689 Other abnormalities of gait and mobility: Secondary | ICD-10-CM

## 2021-06-15 DIAGNOSIS — M25662 Stiffness of left knee, not elsewhere classified: Secondary | ICD-10-CM

## 2021-06-15 DIAGNOSIS — R29898 Other symptoms and signs involving the musculoskeletal system: Secondary | ICD-10-CM

## 2021-06-15 DIAGNOSIS — M6281 Muscle weakness (generalized): Secondary | ICD-10-CM

## 2021-06-15 NOTE — Therapy (Signed)
OUTPATIENT PHYSICAL THERAPY TREATMENT NOTE   Patient Name: Riley Larson MRN: [id number] DOB:09/26/1970, 51 y.o., male Today's Date: 06/15/2021  PCP: Sharilyn Sites, MD REFERRING PROVIDER: Sharilyn Sites, MD   PT End of Session - 06/15/21 1237     Visit Number 3    Number of Visits 12    Date for PT Re-Evaluation 07/16/21    Authorization Type Primary BCBS Comm PPO secondary: [place] focus    Progress Note Due on Visit 10    PT Start Time 1132    PT Stop Time 1215    PT Time Calculation (min) 43 min    Activity Tolerance Patient tolerated treatment well    Behavior During Therapy WFL for tasks assessed/performed             Past Medical History:  Diagnosis Date   Arthritis    Hypertension    Past Surgical History:  Procedure Laterality Date   BACK SURGERY     COLONOSCOPY WITH PROPOFOL N/A 03/14/2020   Procedure: COLONOSCOPY WITH PROPOFOL;  Surgeon: Harvel Quale, MD;  [facility]: [facility];  Service: Gastroenterology;  Laterality: N/A;  9:15   LAPAROSCOPIC GASTRIC BANDING     left index finger surgery      TOTAL KNEE ARTHROPLASTY Right 03/09/2021   Procedure: TOTAL KNEE ARTHROPLASTY;  Surgeon: Earlie Server, MD;  Location: [facility];  Service: Orthopedics;  Laterality: Right;   TOTAL KNEE ARTHROPLASTY Left 06/01/2021   Procedure: TOTAL KNEE ARTHROPLASTY;  Surgeon: Earlie Server, MD;  Location: [facility];  Service: Orthopedics;  Laterality: Left;   Patient Active Problem List   Diagnosis Date Noted   CELLULITIS, LEFT HAND 10/16/2009    REFERRING DIAG: R TKA  THERAPY DIAG:  Stiffness of left knee, not elsewhere classified  Left knee pain, unspecified chronicity  Muscle weakness (generalized)  Other abnormalities of gait and mobility  Other symptoms and signs involving the musculoskeletal system  PERTINENT HISTORY: none  PRECAUTIONS: none   SUBJECTIVE: Saw MD, happy wiht progress.  Reports pain in calf and hip today, 5/10.  Been compliant  with HEP daily.  PAIN:  Are you having pain? Yes: Pain location: Rt thigh and calf Pain description: sore, tight Aggravating factors: morning, stiffness at night Relieving factors: rest     TODAY'S TREATMENT:    06/15/21 0001  Knee/Hip Exercises: Stretches  Knee: Self-Stretch to increase Flexion Right;5 reps;10 seconds  Knee: Self-Stretch Limitations knee drive on 36RW step height  Gastroc Stretch 3 reps;30 seconds;Both  Gastroc Stretch Limitations slant board  Knee/Hip Exercises: Aerobic  Recumbent Bike 40mn seat 18 initial rocking then backward/forward full revolution  Knee/Hip Exercises: Standing  Heel Raises 15 reps;3 seconds  Terminal Knee Extension 10 reps;Theraband  Theraband Level (Terminal Knee Extension) Level 3 (Green)  Terminal Knee Extension Limitations 5" holds  Rocker Board 1 minute  Rocker Board Limitations lateral and DF/PF  Knee/Hip Exercises: Seated  Sit to [employer]10 reps;without UE support  Knee/Hip Exercises: Supine  Quad Sets 10 reps  Short Arc [employer]10 reps;Left  Heel Slides Left;10 reps  Heel Slides Limitations 10 second  Knee Extension AROM;Left  Knee Extension Limitations 6  Knee Flexion AROM;Left  Knee Flexion Limitations AROM 94, AAROM with rope to 98  Manual Therapy  Manual Therapy Soft tissue mobilization  Manual therapy comments All manual completed separately from other skilled interventions  Soft tissue mobilization STM to gastroc in prone and quad in supine     HOME EXERCISE PROGRAM:  quad set, heel slides supine and seated, ankle pumps, SLR.  Add gastroc stretch next session.     PT Short Term Goals - 06/04/21 0913       PT SHORT TERM GOAL #1   Title Patient will report understanding and regular compliance with HEP to improve mobility and decrease pain.    Time 3    Period Weeks    Status New    Target Date 06/25/21      PT SHORT TERM GOAL #2   Title Demonstrate right knee extension 0 degrees and flexion 90 degrees to  improve gait mechanics    Time 3    Period Weeks    Status New    Target Date 06/25/21              PT Long Term Goals - 06/04/21 0914       PT LONG TERM GOAL #1   Title Patient will report an improvement of at least 75% in overall symptoms  to improve QOL.    Time 6    Period Weeks    Status New    Target Date 07/16/21      PT LONG TERM GOAL #2   Title Ambulate independently with normalized pattern and stairs with reciprocal pattern    Time 6    Period Weeks    Status New    Target Date 07/16/21      PT LONG TERM GOAL #3   Title Patient will improve FOTO score by at least 30 points in order to indicate improved tolerance to activity.    Time 6    Period Weeks    Status New    Target Date 07/16/21      PT LONG TERM GOAL #4   Title Patient will improve ROM for left knee extension/flexion to 0-115 degrees to improve squatting, and other functional mobility.    Time 6    Period Weeks    Status New    Target Date 07/16/21              Plan - 06/15/21 1301     Clinical Impression Statement Pt progressing well towards POC with minimal edema and improved AROM both directions.  Pt able to make full revolution following initial rocking on bicycle.  Improved AROM 6-94 and AAROM to 98.  Added rocker board to improve weight distribution with gait and stretches for mobility.  EOS with manual STM for pain control.  EOS pain reduced to 3/10.    Personal Factors and Comorbidities Profession    Examination-Activity Limitations Bend;Carry;Lift;Toileting;Stairs;Squat;Sleep;Locomotion Level;Transfers    Examination-Participation Restrictions Cleaning;Community Activity;Occupation    Stability/Clinical Decision Making Stable/Uncomplicated    Clinical Decision Making Low    Rehab Potential Excellent    PT Frequency 2x / week    PT Duration 6 weeks    PT Treatment/Interventions ADLs/Self Care Home Management;Electrical Stimulation;DME Instruction;Gait training;Functional mobility  training;Therapeutic activities;Therapeutic exercise;Balance training;Neuromuscular re-education;Patient/family education;Passive range of motion;Splinting;Taping;Dry needling;Energy conservation;Compression bandaging;Manual techniques;Orthotic Fit/Training;Stair training;Cryotherapy    PT Next Visit Plan f/u with HEP and compression garments, L knee mobility, quad strength, progress as tolerated.  Instruct gastroc stretch for HEP next session.    PT Home Exercise Plan quad set, heel slides supine and seated, ankle pumps    Consulted and Agree with Plan of Care Patient              Ihor Austin, LPTA/CLT; CBIS [phone]  Aldona Lento, PTA 06/15/2021, 1:07 PM

## 2021-06-18 ENCOUNTER — Encounter (HOSPITAL_COMMUNITY): Payer: BC Managed Care – PPO | Admitting: Physical Therapy

## 2021-06-20 ENCOUNTER — Encounter (HOSPITAL_COMMUNITY): Payer: BC Managed Care – PPO

## 2021-06-20 ENCOUNTER — Telehealth (HOSPITAL_COMMUNITY): Payer: Self-pay

## 2021-06-20 NOTE — Telephone Encounter (Signed)
No show, attempted to call with no answer or answering machine to leave message.   ? ?Ihor Austin, LPTA/CLT; CBIS ?[phone] ? ?

## 2021-06-22 ENCOUNTER — Ambulatory Visit (HOSPITAL_COMMUNITY): Payer: BC Managed Care – PPO

## 2021-06-22 ENCOUNTER — Encounter (HOSPITAL_COMMUNITY): Payer: Self-pay

## 2021-06-22 ENCOUNTER — Other Ambulatory Visit: Payer: Self-pay

## 2021-06-22 DIAGNOSIS — M25662 Stiffness of left knee, not elsewhere classified: Secondary | ICD-10-CM

## 2021-06-22 DIAGNOSIS — M6281 Muscle weakness (generalized): Secondary | ICD-10-CM

## 2021-06-22 DIAGNOSIS — M25562 Pain in left knee: Secondary | ICD-10-CM

## 2021-06-22 DIAGNOSIS — R2689 Other abnormalities of gait and mobility: Secondary | ICD-10-CM

## 2021-06-22 NOTE — Therapy (Addendum)
?OUTPATIENT PHYSICAL THERAPY TREATMENT NOTE ? ? ?Patient Name: Riley Larson ?MRN: [id number] ?DOB:[birth year], 51 y.o., male ?Today's Date: 06/22/2021 ? ?PCP: Sharilyn Sites, MD ?REFERRING PROVIDER: Earlie Server MD  ? PT End of Session - 06/22/21 1010   ? ? Visit Number 4   ? Number of Visits 12   ? Date for PT Re-Evaluation 07/16/21   ? Authorization Type Primary BCBS Comm PPO secondary: [place] focus   ? Progress Note Due on Visit 10   ? PT Start Time 1003   ? PT Stop Time 0258   ? PT Time Calculation (min) 39 min   ? Activity Tolerance Patient tolerated treatment well   ? Behavior During Therapy [facility] for tasks assessed/performed   ? ?  ?  ? ?  ? ? ? ?Past Medical History:  ?Diagnosis Date  ? Arthritis   ? Hypertension   ? ?Past Surgical History:  ?Procedure Laterality Date  ? BACK SURGERY    ? COLONOSCOPY WITH PROPOFOL N/A 03/14/2020  ? Procedure: COLONOSCOPY WITH PROPOFOL;  Surgeon: Harvel Quale, MD;  Location: [facility];  Service: Gastroenterology;  Laterality: N/A;  9:15  ? LAPAROSCOPIC GASTRIC BANDING    ? left index finger surgery     ? TOTAL KNEE ARTHROPLASTY Right 03/09/2021  ? Procedure: TOTAL KNEE ARTHROPLASTY;  Surgeon: Earlie Server, MD;  Location: [facility];  Service: Orthopedics;  Laterality: Right;  ? TOTAL KNEE ARTHROPLASTY Left 06/01/2021  ? Procedure: TOTAL KNEE ARTHROPLASTY;  Surgeon: Earlie Server, MD;  Location: [facility];  Service: Orthopedics;  Laterality: Left;  ? ?Patient Active Problem List  ? Diagnosis Date Noted  ? CELLULITIS, LEFT HAND 10/16/2009  ? ? ?REFERRING DIAG: L TKA ? ?THERAPY DIAG:  ?Stiffness of left knee, not elsewhere classified ? ?Left knee pain, unspecified chronicity ? ?Muscle weakness (generalized) ? ?Other abnormalities of gait and mobility ? ?Onset date: 06/01/21 ? ?PERTINENT HISTORY: none ? ?PRECAUTIONS: none  ? ?SUBJECTIVE: Knee is stiff, thigh is sore today.  Most difficulty putting on socks/shoes. ? ?PAIN:  ?Are you having pain? Yes: Pain location:  Lt knee ?Pain description: sore, tight ?Aggravating factors: morning, stiffness at night ?Relieving factors: rest ? ? ? ? ?TODAY'S TREATMENT:  ? ? ? 06/22/21 0001  ?Knee/Hip Exercises: Stretches  ?Quad Stretch 2 reps;30 seconds  ?Quad Stretch Limitations prone with rope  ?Knee: Self-Stretch to increase Flexion Right;5 reps;10 seconds  ?Knee: Self-Stretch Limitations knee drive on 52DP step height  ?Gastroc Stretch 2 reps;30 seconds  ?Other Knee/Hip Stretches Gastroc stretch facing wall 2x 30"  ?Active Hamstring Stretch Left;3 reps;30 seconds  ?Active Hamstring Stretch Limitations supine hands behind knee  ?Knee/Hip Exercises: Aerobic  ?Recumbent Bike 15mn seat 16 full revolution  ?Knee/Hip Exercises: Standing  ?Heel Raises 15 reps;3 seconds  ?Terminal Knee Extension 10 reps;Theraband  ?Theraband Level (Terminal Knee Extension) Level 3 (Green)  ?Terminal Knee Extension Limitations 5" holds  ?Rocker Board 1 minute  ?Rocker Board Limitations lateral and DF/PF  ?Heel Raises Limitations toe raises incline slope  ?Knee/Hip Exercises: Supine  ?Quad Sets 10 reps  ?Heel Slides Left;10 reps  ?Heel Slides Limitations 10 second  ?Knee Extension AROM;Left  ?Knee Extension Limitations 4  ?Knee Flexion AROM  ?Knee Flexion Limitations 102  ?Manual Therapy  ?Manual Therapy Soft tissue mobilization;Myofascial release;Joint mobilization  ?Manual therapy comments All manual completed separately from other skilled interventions  ?Joint Mobilization patella mobs all direction  ?Soft tissue mobilization STM to Lt thigh  ?Myofascial Release  Scar tissue massage complete and instructed for self care  ? ? ? ? 06/15/21 0001  ?Knee/Hip Exercises: Stretches  ?Knee: Self-Stretch to increase Flexion Right;5 reps;10 seconds  ?Knee: Self-Stretch Limitations knee drive on 01XB step height  ?Gastroc Stretch 3 reps;30 seconds;Both  ?Gastroc Stretch Limitations slant board  ?Knee/Hip Exercises: Aerobic  ?Recumbent Bike 59mn seat 18 initial rocking then  backward/forward full revolution  ?Knee/Hip Exercises: Standing  ?Heel Raises 15 reps;3 seconds  ?Terminal Knee Extension 10 reps;Theraband  ?Theraband Level (Terminal Knee Extension) Level 3 (Green)  ?Terminal Knee Extension Limitations 5" holds  ?Rocker Board 1 minute  ?Rocker Board Limitations lateral and DF/PF  ?Knee/Hip Exercises: Seated  ?Sit to Sand 10 reps;without UE support  ?Knee/Hip Exercises: Supine  ?Quad Sets 10 reps  ?Short Arc [employer]10 reps;Left  ?Heel Slides Left;10 reps  ?Heel Slides Limitations 10 second  ?Knee Extension AROM;Left  ?Knee Extension Limitations 6  ?Knee Flexion AROM;Left  ?Knee Flexion Limitations AROM 94, AAROM with rope to 98  ?Manual Therapy  ?Manual Therapy Soft tissue mobilization  ?Manual therapy comments All manual completed separately from other skilled interventions  ?Soft tissue mobilization STM to gastroc in prone and quad in supine  ? ? ? ?HOME EXERCISE PROGRAM: ?quad set, heel slides supine and seated, ankle pumps, SLR.  Add gastroc stretch next session.   ? ? PT Short Term Goals - 06/04/21 0913   ? ?  ? PT SHORT TERM GOAL #1  ? Title Patient will report understanding and regular compliance with HEP to improve mobility and decrease pain.   ? Time 3   ? Period Weeks   ? Status New   ? Target Date 06/25/21   ?  ? PT SHORT TERM GOAL #2  ? Title Demonstrate right knee extension 0 degrees and flexion 90 degrees to improve gait mechanics   ? Time 3   ? Period Weeks   ? Status New   ? Target Date 06/25/21   ? ?  ?  ? ?  ? ? ? PT Long Term Goals - 06/04/21 0914   ? ?  ? PT LONG TERM GOAL #1  ? Title Patient will report an improvement of at least 75% in overall symptoms  to improve QOL.   ? Time 6   ? Period Weeks   ? Status New   ? Target Date 07/16/21   ?  ? PT LONG TERM GOAL #2  ? Title Ambulate independently with normalized pattern and stairs with reciprocal pattern   ? Time 6   ? Period Weeks   ? Status New   ? Target Date 07/16/21   ?  ? PT LONG TERM GOAL #3  ? Title  Patient will improve FOTO score by at least 30 points in order to indicate improved tolerance to activity.   ? Time 6   ? Period Weeks   ? Status New   ? Target Date 07/16/21   ?  ? PT LONG TERM GOAL #4  ? Title Patient will improve ROM for left knee extension/flexion to 0-115 degrees to improve squatting, and other functional mobility.   ? Time 6   ? Period Weeks   ? Status New   ? Target Date 07/16/21   ? ?  ?  ? ?  ? ? ? Plan - 06/22/21 1037   ? ? Clinical Impression Statement Session focus with knee mobilitiy.  Added gastroc and hamstring stretch to HEP.  Manual  STM complete to Lt thigh to address tightness, patella mobs for mobility and instructed scar tissue massage for self care.  Pt tolerated well to session with no reports of increased pain.  Improve AROM 4-102 degrees.   ? Personal Factors and Comorbidities Profession   ? Examination-Activity Limitations Bend;Carry;Lift;Toileting;Stairs;Squat;Sleep;Locomotion Level;Transfers   ? Examination-Participation Restrictions Cleaning;Community Activity;Occupation   ? Stability/Clinical Decision Making Stable/Uncomplicated   ? Clinical Decision Making Low   ? Rehab Potential Excellent   ? PT Frequency 2x / week   ? PT Duration 6 weeks   ? PT Treatment/Interventions ADLs/Self Care Home Management;Electrical Stimulation;DME Instruction;Gait training;Functional mobility training;Therapeutic activities;Therapeutic exercise;Balance training;Neuromuscular re-education;Patient/family education;Passive range of motion;Splinting;Taping;Dry needling;Energy conservation;Compression bandaging;Manual techniques;Orthotic Fit/Training;Stair training;Cryotherapy   ? PT Next Visit Plan Add contract/relax for flexion and hip mobility/piriformis stretch to increased donning sock/shoes next session.  Progress L knee mobility, quad strength, progress as tolerated.   ? PT Home Exercise Plan quad set, heel slides supine and seated, ankle pumps; 3/17: gastroc and hamstring stretch   ?  Consulted and Agree with Plan of Care Patient   ? ?  ?  ? ?  ? ? ? ? ?Ihor Austin, LPTA/CLT; CBIS ?[phone] ? ?Aldona Lento, PTA ?06/22/2021, 10:56 AM ? ?  ? ?

## 2021-06-22 NOTE — Patient Instructions (Addendum)
Gastroc, Standing ? ? ? ?Stand, left foot behind, heel on floor and turned slightly out, leg straight, forward leg bent. Keeping arms straight, push pelvis forward until stretch is felt in calf. Hold 30 seconds. ?Repeat 3 times per session. Do 2 sessions per day. ? ?Copyright ? VHI. All rights reserved.  ? ? ?Hamstring Step 3 ? ? ? ?Left leg in maximal straight leg raise, heel at maximal stretch, straighten knee further by tightening knee cap. Warning: Intense stretch. Stay within tolerance.  ?Hold 30 seconds. Relax knee cap only. ?Repeat 3 times. ? ?Copyright ? VHI. All rights reserved.  ? ?

## 2021-06-25 ENCOUNTER — Encounter (HOSPITAL_COMMUNITY): Payer: BC Managed Care – PPO

## 2021-06-27 ENCOUNTER — Encounter (HOSPITAL_COMMUNITY): Payer: Self-pay | Admitting: Physical Therapy

## 2021-06-27 ENCOUNTER — Ambulatory Visit (HOSPITAL_COMMUNITY): Payer: BC Managed Care – PPO | Admitting: Physical Therapy

## 2021-06-27 ENCOUNTER — Other Ambulatory Visit: Payer: Self-pay

## 2021-06-27 DIAGNOSIS — M25662 Stiffness of left knee, not elsewhere classified: Secondary | ICD-10-CM | POA: Diagnosis not present

## 2021-06-27 DIAGNOSIS — R2689 Other abnormalities of gait and mobility: Secondary | ICD-10-CM

## 2021-06-27 DIAGNOSIS — M25562 Pain in left knee: Secondary | ICD-10-CM

## 2021-06-27 DIAGNOSIS — R29898 Other symptoms and signs involving the musculoskeletal system: Secondary | ICD-10-CM

## 2021-06-27 DIAGNOSIS — M6281 Muscle weakness (generalized): Secondary | ICD-10-CM

## 2021-06-27 NOTE — Therapy (Signed)
?OUTPATIENT PHYSICAL THERAPY TREATMENT NOTE ? ? ?Patient Name: Riley Larson ?MRN: [id number] ?DOB:1971-01-27, 51 y.o., male ?Today's Date: 06/27/2021 ? ?PCP: Sharilyn Sites, MD ?REFERRING PROVIDER: Earlie Server MD ? ? PT End of Session - 06/27/21 0840   ? ? Visit Number 5   ? Number of Visits 12   ? Date for PT Re-Evaluation 07/16/21   ? Authorization Type Primary BCBS Comm PPO secondary: [place] focus   ? Progress Note Due on Visit 10   ? PT Start Time 0840   ? PT Stop Time 5573   ? PT Time Calculation (min) 40 min   ? Activity Tolerance Patient tolerated treatment well   ? Behavior During Therapy [facility] for tasks assessed/performed   ? ?  ?  ? ?  ? ? ?Past Medical History:  ?Diagnosis Date  ? Arthritis   ? Hypertension   ? ?Past Surgical History:  ?Procedure Laterality Date  ? BACK SURGERY    ? COLONOSCOPY WITH PROPOFOL N/A 03/14/2020  ? Procedure: COLONOSCOPY WITH PROPOFOL;  Surgeon: Harvel Quale, MD;  [facility]: [facility];  Service: Gastroenterology;  Laterality: N/A;  9:15  ? LAPAROSCOPIC GASTRIC BANDING    ? left index finger surgery     ? TOTAL KNEE ARTHROPLASTY Right 03/09/2021  ? Procedure: TOTAL KNEE ARTHROPLASTY;  Surgeon: Earlie Server, MD;  Location: [facility];  Service: Orthopedics;  Laterality: Right;  ? TOTAL KNEE ARTHROPLASTY Left 06/01/2021  ? Procedure: TOTAL KNEE ARTHROPLASTY;  Surgeon: Earlie Server, MD;  Location: [facility];  Service: Orthopedics;  Laterality: Left;  ? ?Patient Active Problem List  ? Diagnosis Date Noted  ? CELLULITIS, LEFT HAND 10/16/2009  ? ? ?REFERRING DIAG:  ? L TKA   ? ? ?THERAPY DIAG:  ?Stiffness of left knee, not elsewhere classified ? ?Left knee pain, unspecified chronicity ? ?Muscle weakness (generalized) ? ?Other abnormalities of gait and mobility ? ?Other symptoms and signs involving the musculoskeletal system ? ?PERTINENT HISTORY: s/p L TKA on 06/01/21 ? ?PRECAUTIONS: none ? ?SUBJECTIVE: Been riding bike at [facility] in morning. Random pain in knee cap  sometimes.  ? ?PAIN:  ?Are you having pain? Yes: Pain location: Lt knee 2-3/10 ?Pain description: sore, tight ?Aggravating factors: morning, stiffness at night ?Relieving factors: rest ? ?Objective: ? ?LE ROM: ? ?Active ROM Right ?06/27/2021 Left ?06/27/2021  ?Hip flexion    ?Hip extension    ?Hip abduction    ?Hip adduction    ?Hip internal rotation    ?Hip external rotation    ?Knee flexion  115   ?Knee extension  Lacking 3  ?Ankle dorsiflexion    ?Ankle plantarflexion    ?Ankle inversion    ?Ankle eversion    ? (Blank rows = not tested) ?*= pain ? ?LE MMT: ? ?MMT Right Left ?  ?Hip flexion    ?Hip extension    ?Hip abduction    ?Hip adduction    ?Hip internal rotation    ?Hip external rotation    ?Knee flexion    ?Knee extension    ?Ankle dorsiflexion    ?Ankle plantarflexion    ?Ankle inversion    ?Ankle eversion    ? (Blank rows = not tested) ?*= pain ? ? ? ?TODAY'S TREATMENT:  ? ?06/27/21 ?Bike 5 minutes, Level 3 for dynamic warm up ?Heel slides 10 x 10 second holds LLE ?Step up 4 inch step 2 x 10 without UE support ?Step up 6 inch step 2 x 10 with  unilateral  UE support ?STS 2x 10 without UE support ?Lateral step down 2x 10 eccentric control  ?TKE 6 plates 10 x 5 second holds 2 sets ? ? ? ? 06/22/21 0001  ?Knee/Hip Exercises: Stretches  ?Quad Stretch 2 reps;30 seconds  ?Quad Stretch Limitations prone with rope  ?Knee: Self-Stretch to increase Flexion Right;5 reps;10 seconds  ?Knee: Self-Stretch Limitations knee drive on 13KG step height  ?Gastroc Stretch 2 reps;30 seconds  ?Other Knee/Hip Stretches Gastroc stretch facing wall 2x 30"  ?Active Hamstring Stretch Left;3 reps;30 seconds  ?Active Hamstring Stretch Limitations supine hands behind knee  ?Knee/Hip Exercises: Aerobic  ?Recumbent Bike 53mn seat 16 full revolution  ?Knee/Hip Exercises: Standing  ?Heel Raises 15 reps;3 seconds  ?Terminal Knee Extension 10 reps;Theraband  ?Theraband Level (Terminal Knee Extension) Level 3 (Green)  ?Terminal Knee Extension  Limitations 5" holds  ?Rocker Board 1 minute  ?Rocker Board Limitations lateral and DF/PF  ?Heel Raises Limitations toe raises incline slope  ?Knee/Hip Exercises: Supine  ?Quad Sets 10 reps  ?Heel Slides Left;10 reps  ?Heel Slides Limitations 10 second  ?Knee Extension AROM;Left  ?Knee Extension Limitations 4  ?Knee Flexion AROM  ?Knee Flexion Limitations 102  ?Manual Therapy  ?Manual Therapy Soft tissue mobilization;Myofascial release;Joint mobilization  ?Manual therapy comments All manual completed separately from other skilled interventions  ?Joint Mobilization patella mobs all direction  ?Soft tissue mobilization STM to Lt thigh  ?Myofascial Release Scar tissue massage complete and instructed for self care  ? ? ? ? 06/15/21 0001  ?Knee/Hip Exercises: Stretches  ?Knee: Self-Stretch to increase Flexion Right;5 reps;10 seconds  ?Knee: Self-Stretch Limitations knee drive on 140NUstep height  ?Gastroc Stretch 3 reps;30 seconds;Both  ?Gastroc Stretch Limitations slant board  ?Knee/Hip Exercises: Aerobic  ?Recumbent Bike 584m seat 18 initial rocking then backward/forward full revolution  ?Knee/Hip Exercises: Standing  ?Heel Raises 15 reps;3 seconds  ?Terminal Knee Extension 10 reps;Theraband  ?Theraband Level (Terminal Knee Extension) Level 3 (Green)  ?Terminal Knee Extension Limitations 5" holds  ?Rocker Board 1 minute  ?Rocker Board Limitations lateral and DF/PF  ?Knee/Hip Exercises: Seated  ?Sit to Sand 10 reps;without UE support  ?Knee/Hip Exercises: Supine  ?Quad Sets 10 reps  ?Short Arc [employer]0 reps;Left  ?Heel Slides Left;10 reps  ?Heel Slides Limitations 10 second  ?Knee Extension AROM;Left  ?Knee Extension Limitations 6  ?Knee Flexion AROM;Left  ?Knee Flexion Limitations AROM 94, AAROM with rope to 98  ?Manual Therapy  ?Manual Therapy Soft tissue mobilization  ?Manual therapy comments All manual completed separately from other skilled interventions  ?Soft tissue mobilization STM to gastroc in prone and  quad in supine  ? ? ? ?HOME EXERCISE PROGRAM: ?quad set, heel slides supine and seated, ankle pumps, SLR. 3/22 STS, step up, lateral step down ? ? PT Short Term Goals   ? ?  ? PT SHORT TERM GOAL #1  ? Title Patient will report understanding and regular compliance with HEP to improve mobility and decrease pain.   ? Time 3   ? Period Weeks   ? Status Achieved  ? Target Date 06/25/21   ?  ? PT SHORT TERM GOAL #2  ? Title Demonstrate right knee extension 0 degrees and flexion 90 degrees to improve gait mechanics   ? Time 3   ? Period Weeks   ? Status Achieved lacking 3 to 115 on 06/27/21  ? Target Date 06/25/21   ? ?  ?  ? ?  ? ? ?  PT Long Term Goals   ? ?  ? PT LONG TERM GOAL #1  ? Title Patient will report an improvement of at least 75% in overall symptoms  to improve QOL.   ? Time 6   ? Period Weeks   ? Status Ongoing   ? Target Date 07/16/21   ?  ? PT LONG TERM GOAL #2  ? Title Ambulate independently with normalized pattern and stairs with reciprocal pattern   ? Time 6   ? Period Weeks   ? Status Ongoing   ? Target Date 07/16/21   ?  ? PT LONG TERM GOAL #3  ? Title Patient will improve FOTO score by at least 30 points in order to indicate improved tolerance to activity.   ? Time 6   ? Period Weeks   ? Status Ongoing   ? Target Date 07/16/21   ?  ? PT LONG TERM GOAL #4  ? Title Patient will improve ROM for left knee extension/flexion to 0-115 degrees to improve squatting, and other functional mobility.   ? Time 6   ? Period Weeks   ? Status Ongoing   ? Target Date 07/16/21   ? ?  ?  ? ?  ? ? ? ? ? Plan - 06/27/21 0847   ? ? Clinical Impression Statement Patient demonstrating improving ROM from lacking 3 to 115. Progressed standing strength training with step up exercises today which is tolerated well. Completes several rounds on 4 inch steps with ease without UE support, some unsteadiness with 6 inch step but overall good mechanics with decreased eccentric strength. Overall, he is progressing very well with ROM and  strength. Patient will continue to benefit from physical therapy in order reduce impairment and improve function.   ? Personal Factors and Comorbidities Profession   ? Examination-Activity Limitations Bend;Carry;L

## 2021-06-27 NOTE — Patient Instructions (Signed)
Access Code: [email or web address] ?URL: [email or web address] ?Date: 06/27/2021 ?Prepared by: Mitzi Hansen Alleen Kehm ? ?Exercises ?Step Up - 1 x daily - 7 x weekly - 2 sets - 10 reps ?Sit to Stand with Arms Crossed - 1 x daily - 7 x weekly - 3 sets - 10 reps ?Lateral Step Down - 1 x daily - 7 x weekly - 2 sets - 10 reps ? ?

## 2021-06-29 ENCOUNTER — Ambulatory Visit (HOSPITAL_COMMUNITY): Payer: BC Managed Care – PPO

## 2021-06-29 ENCOUNTER — Encounter (HOSPITAL_COMMUNITY): Payer: Self-pay

## 2021-06-29 ENCOUNTER — Other Ambulatory Visit: Payer: Self-pay

## 2021-06-29 DIAGNOSIS — R29898 Other symptoms and signs involving the musculoskeletal system: Secondary | ICD-10-CM

## 2021-06-29 DIAGNOSIS — M6281 Muscle weakness (generalized): Secondary | ICD-10-CM

## 2021-06-29 DIAGNOSIS — M25662 Stiffness of left knee, not elsewhere classified: Secondary | ICD-10-CM

## 2021-06-29 DIAGNOSIS — M25562 Pain in left knee: Secondary | ICD-10-CM

## 2021-06-29 DIAGNOSIS — R2689 Other abnormalities of gait and mobility: Secondary | ICD-10-CM

## 2021-06-29 NOTE — Therapy (Signed)
?OUTPATIENT PHYSICAL THERAPY TREATMENT NOTE ? ? ?Patient Name: Riley Larson ?MRN: [id number] ?DOB:12-25-1970, 51 y.o., male ?Today's Date: 06/29/2021 ? ?PCP: Sharilyn Sites, MD ?REFERRING PROVIDER: Earlie Server MD ? ? PT End of Session - 06/29/21 1047   ? ? Visit Number 6   ? Number of Visits 12   ? Date for PT Re-Evaluation 07/16/21   ? Authorization Type Primary BCBS Comm PPO secondary: [place] focus   ? Progress Note Due on Visit 10   ? PT Start Time 1003   ? PT Stop Time 2330   ? PT Time Calculation (min) 39 min   ? Activity Tolerance Patient tolerated treatment well   ? Behavior During Therapy [facility] for tasks assessed/performed   ? ?  ?  ? ?  ? ? ? ?Past Medical History:  ?Diagnosis Date  ? Arthritis   ? Hypertension   ? ?Past Surgical History:  ?Procedure Laterality Date  ? BACK SURGERY    ? COLONOSCOPY WITH PROPOFOL N/A 03/14/2020  ? Procedure: COLONOSCOPY WITH PROPOFOL;  Surgeon: Harvel Quale, MD;  [facility];  Service: Gastroenterology;  Laterality: N/A;  9:15  ? LAPAROSCOPIC GASTRIC BANDING    ? left index finger surgery     ? TOTAL KNEE ARTHROPLASTY Right 03/09/2021  ? Procedure: TOTAL KNEE ARTHROPLASTY;  Surgeon: Earlie Server, MD;  Location: [facility];  Service: Orthopedics;  Laterality: Right;  ? TOTAL KNEE ARTHROPLASTY Left 06/01/2021  ? Procedure: TOTAL KNEE ARTHROPLASTY;  Surgeon: Earlie Server, MD;  Location: [facility];  Service: Orthopedics;  Laterality: Left;  ? ?Patient Active Problem List  ? Diagnosis Date Noted  ? CELLULITIS, LEFT HAND 10/16/2009  ? ? ?REFERRING DIAG:  ? L TKA   ? ? ?THERAPY DIAG:  ?Stiffness of left knee, not elsewhere classified ? ?Left knee pain, unspecified chronicity ? ?Muscle weakness (generalized) ? ?Other abnormalities of gait and mobility ? ?Other symptoms and signs involving the musculoskeletal system ? ?PERTINENT HISTORY: s/p L TKA on 06/01/21 ? ?PRECAUTIONS: none ? ?SUBJECTIVE: Knee is sore today, HEP is going well. ? ?PAIN:  ?Are you having  pain? Yes: Pain location: Lt knee 2-3/10 ?Pain description: sore, tight ?Aggravating factors: morning, stiffness at night ?Relieving factors: rest ? ?Objective: ? ?LE ROM: ? ?Active ROM Right ?06/27/2021 Left ?06/27/2021  ?Hip flexion    ?Hip extension    ?Hip abduction    ?Hip adduction    ?Hip internal rotation    ?Hip external rotation    ?Knee flexion  115   ?Knee extension  Lacking 3  ?Ankle dorsiflexion    ?Ankle plantarflexion    ?Ankle inversion    ?Ankle eversion    ? (Blank rows = not tested) ?*= pain ? ?LE MMT: ? ?MMT Right Left ?  ?Hip flexion    ?Hip extension    ?Hip abduction    ?Hip adduction    ?Hip internal rotation    ?Hip external rotation    ?Knee flexion    ?Knee extension    ?Ankle dorsiflexion    ?Ankle plantarflexion    ?Ankle inversion    ?Ankle eversion    ? (Blank rows = not tested) ?*= pain ? ? ? ?TODAY'S TREATMENT:  ? ?06/28/21: ? Bike 5 minutes, Level 3 for dynamic warm up ? Squat then heel raise 10x 3" ? TKE 6 plates 10 x 5 second holds 2 ? Lateral and forward step up 6in each 15x each ? Step down 6in 15x ?  SLS:  Rt 18", Lt 35" max ?Leg press 4Pl 10x slow and controlled ?Seated: heel slide with AAROM at end range 10x 10" ? STS 10 no HHA  ?Supine: AROM 3-118 degree ? ?06/27/21 ?Bike 5 minutes, Level 3 for dynamic warm up ?Heel slides 10 x 10 second holds LLE ?Step up 4 inch step 2 x 10 without UE support ?Step up 6 inch step 2 x 10 with unilateral  UE support ?STS 2x 10 without UE support ?Lateral step down 2x 10 eccentric control  ?TKE 6 plates 10 x 5 second holds 2 sets ? ? ? ? 06/22/21 0001  ?Knee/Hip Exercises: Stretches  ?Quad Stretch 2 reps;30 seconds  ?Quad Stretch Limitations prone with rope  ?Knee: Self-Stretch to increase Flexion Right;5 reps;10 seconds  ?Knee: Self-Stretch Limitations knee drive on 81EH step height  ?Gastroc Stretch 2 reps;30 seconds  ?Other Knee/Hip Stretches Gastroc stretch facing wall 2x 30"  ?Active Hamstring Stretch Left;3 reps;30 seconds  ?Active Hamstring  Stretch Limitations supine hands behind knee  ?Knee/Hip Exercises: Aerobic  ?Recumbent Bike 40mn seat 16 full revolution  ?Knee/Hip Exercises: Standing  ?Heel Raises 15 reps;3 seconds  ?Terminal Knee Extension 10 reps;Theraband  ?Theraband Level (Terminal Knee Extension) Level 3 (Green)  ?Terminal Knee Extension Limitations 5" holds  ?Rocker Board 1 minute  ?Rocker Board Limitations lateral and DF/PF  ?Heel Raises Limitations toe raises incline slope  ?Knee/Hip Exercises: Supine  ?Quad Sets 10 reps  ?Heel Slides Left;10 reps  ?Heel Slides Limitations 10 second  ?Knee Extension AROM;Left  ?Knee Extension Limitations 4  ?Knee Flexion AROM  ?Knee Flexion Limitations 102  ?Manual Therapy  ?Manual Therapy Soft tissue mobilization;Myofascial release;Joint mobilization  ?Manual therapy comments All manual completed separately from other skilled interventions  ?Joint Mobilization patella mobs all direction  ?Soft tissue mobilization STM to Lt thigh  ?Myofascial Release Scar tissue massage complete and instructed for self care  ? ? ? ? 06/15/21 0001  ?Knee/Hip Exercises: Stretches  ?Knee: Self-Stretch to increase Flexion Right;5 reps;10 seconds  ?Knee: Self-Stretch Limitations knee drive on 163JSstep height  ?Gastroc Stretch 3 reps;30 seconds;Both  ?Gastroc Stretch Limitations slant board  ?Knee/Hip Exercises: Aerobic  ?Recumbent Bike 523m seat 18 initial rocking then backward/forward full revolution  ?Knee/Hip Exercises: Standing  ?Heel Raises 15 reps;3 seconds  ?Terminal Knee Extension 10 reps;Theraband  ?Theraband Level (Terminal Knee Extension) Level 3 (Green)  ?Terminal Knee Extension Limitations 5" holds  ?Rocker Board 1 minute  ?Rocker Board Limitations lateral and DF/PF  ?Knee/Hip Exercises: Seated  ?Sit to Sand 10 reps;without UE support  ?Knee/Hip Exercises: Supine  ?Quad Sets 10 reps  ?Short Arc [employer]0 reps;Left  ?Heel Slides Left;10 reps  ?Heel Slides Limitations 10 second  ?Knee Extension AROM;Left  ?Knee  Extension Limitations 6  ?Knee Flexion AROM;Left  ?Knee Flexion Limitations AROM 94, AAROM with rope to 98  ?Manual Therapy  ?Manual Therapy Soft tissue mobilization  ?Manual therapy comments All manual completed separately from other skilled interventions  ?Soft tissue mobilization STM to gastroc in prone and quad in supine  ? ? ? ?HOME EXERCISE PROGRAM: ?quad set, heel slides supine and seated, ankle pumps, SLR. 3/22 STS, step up, lateral step down ? ? PT Short Term Goals   ? ?  ? PT SHORT TERM GOAL #1  ? Title Patient will report understanding and regular compliance with HEP to improve mobility and decrease pain.   ? Time 3   ? Period Weeks   ?  Status Achieved  ? Target Date 06/25/21   ?  ? PT SHORT TERM GOAL #2  ? Title Demonstrate right knee extension 0 degrees and flexion 90 degrees to improve gait mechanics   ? Time 3   ? Period Weeks   ? Status Achieved lacking 3 to 115 on 06/27/21  ? Target Date 06/25/21   ? ?  ?  ? ?  ? ? ? PT Long Term Goals   ? ?  ? PT LONG TERM GOAL #1  ? Title Patient will report an improvement of at least 75% in overall symptoms  to improve QOL.   ? Time 6   ? Period Weeks   ? Status Ongoing   ? Target Date 07/16/21   ?  ? PT LONG TERM GOAL #2  ? Title Ambulate independently with normalized pattern and stairs with reciprocal pattern   ? Time 6   ? Period Weeks   ? Status Ongoing   ? Target Date 07/16/21   ?  ? PT LONG TERM GOAL #3  ? Title Patient will improve FOTO score by at least 30 points in order to indicate improved tolerance to activity.   ? Time 6   ? Period Weeks   ? Status Ongoing   ? Target Date 07/16/21   ?  ? PT LONG TERM GOAL #4  ? Title Patient will improve ROM for left knee extension/flexion to 0-115 degrees to improve squatting, and other functional mobility.   ? Time 6   ? Period Weeks   ? Status Ongoing   ? Target Date 07/16/21   ? ?  ?  ? ?  ? ? ? ? ? Plan - 06/27/21 0847   ? ? Clinical Impression Statement Patient progressing well toward POC.  AROM 3-118 degrees.   Progressed functional strengthening with addition step down for eccentric control, leg press and SLS for stability.  Pt able to complete all exercises with no reports of increased pain, was limited b

## 2021-07-02 ENCOUNTER — Encounter (HOSPITAL_COMMUNITY): Payer: BC Managed Care – PPO

## 2021-07-04 ENCOUNTER — Encounter (HOSPITAL_COMMUNITY): Payer: Self-pay | Admitting: Physical Therapy

## 2021-07-04 ENCOUNTER — Other Ambulatory Visit: Payer: Self-pay

## 2021-07-04 ENCOUNTER — Ambulatory Visit (HOSPITAL_COMMUNITY): Payer: BC Managed Care – PPO | Admitting: Physical Therapy

## 2021-07-04 DIAGNOSIS — R29898 Other symptoms and signs involving the musculoskeletal system: Secondary | ICD-10-CM

## 2021-07-04 DIAGNOSIS — M25562 Pain in left knee: Secondary | ICD-10-CM

## 2021-07-04 DIAGNOSIS — M25662 Stiffness of left knee, not elsewhere classified: Secondary | ICD-10-CM | POA: Diagnosis not present

## 2021-07-04 DIAGNOSIS — R2689 Other abnormalities of gait and mobility: Secondary | ICD-10-CM

## 2021-07-04 DIAGNOSIS — M6281 Muscle weakness (generalized): Secondary | ICD-10-CM

## 2021-07-04 NOTE — Patient Instructions (Signed)
Access Code: [email or web address] ?URL: [email or web address] ?Date: 07/04/2021 ?Prepared by: Mitzi Hansen Analiya Porco ? ?Exercises ?- Forward Step Down (Mirrored)  - 1 x daily - 7 x weekly - 3 sets - 10 reps ?

## 2021-07-04 NOTE — Therapy (Signed)
?OUTPATIENT PHYSICAL THERAPY TREATMENT NOTE ? ? ?Patient Name: Riley Larson ?MRN: [id number] ?DOB:12-12-1970, 51 y.o., male ?Today's Date: 07/04/2021 ? ?PCP: Sharilyn Sites, MD ?REFERRING PROVIDER: Earlie Server MD ? ? PT End of Session - 07/04/21 0837   ? ? Visit Number 7   ? Number of Visits 12   ? Date for PT Re-Evaluation 07/16/21   ? Authorization Type Primary BCBS Comm PPO secondary: [place] focus   ? Progress Note Due on Visit 10   ? PT Start Time [phone]   ? PT Stop Time 3536   ? PT Time Calculation (min) 38 min   ? Activity Tolerance Patient tolerated treatment well   ? Behavior During Therapy [facility] for tasks assessed/performed   ? ?  ?  ? ?  ? ? ? ?Past Medical History:  ?Diagnosis Date  ? Arthritis   ? Hypertension   ? ?Past Surgical History:  ?Procedure Laterality Date  ? BACK SURGERY    ? COLONOSCOPY WITH PROPOFOL N/A 03/14/2020  ? Procedure: COLONOSCOPY WITH PROPOFOL;  Surgeon: Harvel Quale, MD;  [facility];  Service: Gastroenterology;  Laterality: N/A;  9:15  ? LAPAROSCOPIC GASTRIC BANDING    ? left index finger surgery     ? TOTAL KNEE ARTHROPLASTY Right 03/09/2021  ? Procedure: TOTAL KNEE ARTHROPLASTY;  Surgeon: Earlie Server, MD;  [facility];  Service: Orthopedics;  Laterality: Right;  ? TOTAL KNEE ARTHROPLASTY Left 06/01/2021  ? Procedure: TOTAL KNEE ARTHROPLASTY;  Surgeon: Earlie Server, MD;  [facility];  Service: Orthopedics;  Laterality: Left;  ? ?Patient Active Problem List  ? Diagnosis Date Noted  ? CELLULITIS, LEFT HAND 10/16/2009  ? ? ?REFERRING DIAG:  ? L TKA   ? ? ?THERAPY DIAG:  ?Stiffness of left knee, not elsewhere classified ? ?Left knee pain, unspecified chronicity ? ?Muscle weakness (generalized) ? ?Other abnormalities of gait and mobility ? ?Other symptoms and signs involving the musculoskeletal system ? ?PERTINENT HISTORY: s/p L TKA on 06/01/21 ? ?PRECAUTIONS: none ? ?SUBJECTIVE: Knee feeling good, still going to [facility] when he gets the  chance ? ?PAIN:  ?Are you having pain? Yes: Pain location: Lt knee 0/10 ?Pain description: sore, tight ?Aggravating factors: morning, stiffness at night ?Relieving factors: rest ? ?Objective: ? ?LE ROM: ? ?Active ROM Right ?06/27/2021 Left ?06/27/2021 Left ?07/04/21  ?Hip flexion     ?Hip extension     ?Hip abduction     ?Hip adduction     ?Hip internal rotation     ?Hip external rotation     ?Knee flexion  115  113 in seated  ?Knee extension  Lacking 3 Lacking 1  ?Ankle dorsiflexion     ?Ankle plantarflexion     ?Ankle inversion     ?Ankle eversion     ? (Blank rows = not tested) ?*= pain ? ?LE MMT: ? ?MMT Right Left ?  ?Hip flexion    ?Hip extension    ?Hip abduction    ?Hip adduction    ?Hip internal rotation    ?Hip external rotation    ?Knee flexion    ?Knee extension    ?Ankle dorsiflexion    ?Ankle plantarflexion    ?Ankle inversion    ?Ankle eversion    ? (Blank rows = not tested) ?*= pain ? ? ? ?TODAY'S TREATMENT:  ? ?07/04/21 ?Bike 5 minutes, Level 4 for dynamic warm up ?Lateral step down 2x 10 6 inch step ?Forward step down 2x  10 6 inch step ?STS with black foam under RLE 3x 10 without UE support ?Forward lunge 2x 10 with unilateral UE support  ?Leg press 6Pl 2 x 10 slow and controlled ? ? ? ?06/28/21: ? Bike 5 minutes, Level 3 for dynamic warm up ? Squat then heel raise 10x 3" ? TKE 6 plates 10 x 5 second holds 2 ? Lateral and forward step up 6in each 15x each ? Step down 6in 15x ? SLS:  Rt 18", Lt 35" max ?Leg press 4Pl 10x slow and controlled ?Seated: heel slide with AAROM at end range 10x 10" ? STS 10 no HHA  ?Supine: AROM 3-118 degree ? ?06/27/21 ?Bike 5 minutes, Level 3 for dynamic warm up ?Heel slides 10 x 10 second holds LLE ?Step up 4 inch step 2 x 10 without UE support ?Step up 6 inch step 2 x 10 with unilateral  UE support ?STS 2x 10 without UE support ?Lateral step down 2x 10 eccentric control  ?TKE 6 plates 10 x 5 second holds 2 sets ? ? ? ?HOME EXERCISE PROGRAM: ?quad set, heel slides supine and  seated, ankle pumps, SLR. 3/22 STS, step up, lateral step down ? ? PT Short Term Goals   ? ?  ? PT SHORT TERM GOAL #1  ? Title Patient will report understanding and regular compliance with HEP to improve mobility and decrease pain.   ? Time 3   ? Period Weeks   ? Status Achieved  ? Target Date 06/25/21   ?  ? PT SHORT TERM GOAL #2  ? Title Demonstrate right knee extension 0 degrees and flexion 90 degrees to improve gait mechanics   ? Time 3   ? Period Weeks   ? Status Achieved lacking 3 to 115 on 06/27/21  ? Target Date 06/25/21   ? ?  ?  ? ?  ? ? ? PT Long Term Goals   ? ?  ? PT LONG TERM GOAL #1  ? Title Patient will report an improvement of at least 75% in overall symptoms  to improve QOL.   ? Time 6   ? Period Weeks   ? Status Ongoing   ? Target Date 07/16/21   ?  ? PT LONG TERM GOAL #2  ? Title Ambulate independently with normalized pattern and stairs with reciprocal pattern   ? Time 6   ? Period Weeks   ? Status Ongoing   ? Target Date 07/16/21   ?  ? PT LONG TERM GOAL #3  ? Title Patient will improve FOTO score by at least 30 points in order to indicate improved tolerance to activity.   ? Time 6   ? Period Weeks   ? Status Ongoing   ? Target Date 07/16/21   ?  ? PT LONG TERM GOAL #4  ? Title Patient will improve ROM for left knee extension/flexion to 0-115 degrees to improve squatting, and other functional mobility.   ? Time 6   ? Period Weeks   ? Status Ongoing   ? Target Date 07/16/21   ? ?  ?  ? ?  ? ? ? ? ? Plan - 06/27/21 0847   ? ? Clinical Impression Statement Patient demonstrating improving extension ROM to lacking 1 today. Showing improving eccentric strength and control but requires UE support for strength and balance deficit. Requires tactile and verbal cueing for lowering with lunges rather than anterior translation with fair carry over. Fatigues quickly with lunges.  Patient will continue to benefit from physical therapy in order to improve function and reduce impairment. ?  ? Personal Factors and  Comorbidities Profession   ? Examination-Activity Limitations Bend;Carry;Lift;Toileting;Stairs;Squat;Sleep;Locomotion Level;Transfers   ? Examination-Participation Restrictions Cleaning;Community Activity;Occupation   ? Stability/Clinical Decision Making Stable/Uncomplicated   ? Rehab Potential Excellent   ? PT Frequency 2x / week   ? PT Duration 6 weeks   ? PT Treatment/Interventions ADLs/Self Care Home Management;Electrical Stimulation;DME Instruction;Gait training;Functional mobility training;Therapeutic activities;Therapeutic exercise;Balance training;Neuromuscular re-education;Patient/family education;Passive range of motion;Splinting;Taping;Dry needling;Energy conservation;Compression bandaging;Manual techniques;Orthotic Fit/Training;Stair training;Cryotherapy   ? PT Next Visit Plan   Progress L knee mobility, quad strength, progress as tolerated.   ? PT Home Exercise Plan quad set, heel slides supine and seated, ankle pumps; 3/17: gastroc and hamstring stretch 3/22 STS, step up, lateral step down 3/29 forward step down  ? Consulted and Agree with Plan of Care Patient   ? ?  ?  ? ?  ? ? ? ?8:37 AM, 07/04/21 ?Mearl Latin PT, DPT ?Physical Therapist at [facility] ?[facility] ? ? ?  ? ?

## 2021-07-06 ENCOUNTER — Ambulatory Visit (HOSPITAL_COMMUNITY): Payer: BC Managed Care – PPO

## 2021-07-06 ENCOUNTER — Encounter (HOSPITAL_COMMUNITY): Payer: Self-pay

## 2021-07-06 DIAGNOSIS — M25562 Pain in left knee: Secondary | ICD-10-CM

## 2021-07-06 DIAGNOSIS — M6281 Muscle weakness (generalized): Secondary | ICD-10-CM

## 2021-07-06 DIAGNOSIS — M25662 Stiffness of left knee, not elsewhere classified: Secondary | ICD-10-CM

## 2021-07-06 NOTE — Therapy (Signed)
?OUTPATIENT PHYSICAL THERAPY TREATMENT NOTE ? ? ?Patient Name: Riley Larson ?MRN: [id number] ?DOB:July 07, 1970, 51 y.o., male ?Today's Date: 07/06/2021 ? ?PCP: Sharilyn Sites, MD ?REFERRING PROVIDER: Earlie Server MD ? ? PT End of Session - 07/06/21 0841   ? ? Visit Number 8   ? Number of Visits 12   ? Date for PT Re-Evaluation 07/16/21   ? Authorization Type Primary BCBS Comm PPO secondary: [place] focus   ? Progress Note Due on Visit 10   ? PT Start Time [phone]   late sign in  ? PT Stop Time 0175   ? PT Time Calculation (min) 38 min   ? Activity Tolerance Patient tolerated treatment well   ? Behavior During Therapy [facility] for tasks assessed/performed   ? ?  ?  ? ?  ? ? ? ?Past Medical History:  ?Diagnosis Date  ? Arthritis   ? Hypertension   ? ?Past Surgical History:  ?Procedure Laterality Date  ? BACK SURGERY    ? COLONOSCOPY WITH PROPOFOL N/A 03/14/2020  ? Procedure: COLONOSCOPY WITH PROPOFOL;  Surgeon: Harvel Quale, MD;  [facility];  Service: Gastroenterology;  Laterality: N/A;  9:15  ? LAPAROSCOPIC GASTRIC BANDING    ? left index finger surgery     ? TOTAL KNEE ARTHROPLASTY Right 03/09/2021  ? Procedure: TOTAL KNEE ARTHROPLASTY;  Surgeon: Earlie Server, MD;  [facility];  Service: Orthopedics;  Laterality: Right;  ? TOTAL KNEE ARTHROPLASTY Left 06/01/2021  ? Procedure: TOTAL KNEE ARTHROPLASTY;  Surgeon: Earlie Server, MD;  Location: [facility];  Service: Orthopedics;  Laterality: Left;  ? ?Patient Active Problem List  ? Diagnosis Date Noted  ? CELLULITIS, LEFT HAND 10/16/2009  ? ? ?REFERRING DIAG:  ? L TKA   ? ? ?THERAPY DIAG:  ?Stiffness of left knee, not elsewhere classified ? ?Left knee pain, unspecified chronicity ? ?Muscle weakness (generalized) ? ?PERTINENT HISTORY: s/p L TKA on 06/01/21 ? ?PRECAUTIONS: none ? ?SUBJECTIVE: Both knees are stiff today and Lt knee is sore following new exercise last session.  Went to [employer] and rode bike yesterday. ? ?PAIN:  ?Are you having pain? Yes:  Pain location: Lt knee 0/10 ?Pain description: sore, tight ?Aggravating factors: morning, stiffness at night ?Relieving factors: rest ? ?Objective: ? ?LE ROM: ? ?Active ROM Right ?06/27/2021 Left ?06/27/2021 Left ?07/04/21 Lt 07/06/21  ?Hip flexion      ?Hip extension      ?Hip abduction      ?Hip adduction      ?Hip internal rotation      ?Hip external rotation      ?Knee flexion  115  113 in seated 118 supine  ?Knee extension  Lacking 3 Lacking 1 Lacking 1  ?Ankle dorsiflexion      ?Ankle plantarflexion      ?Ankle inversion      ?Ankle eversion      ? (Blank rows = not tested) ?*= pain ? ?LE MMT: ? ?MMT Right Left ?  ?Hip flexion    ?Hip extension    ?Hip abduction    ?Hip adduction    ?Hip internal rotation    ?Hip external rotation    ?Knee flexion    ?Knee extension    ?Ankle dorsiflexion    ?Ankle plantarflexion    ?Ankle inversion    ?Ankle eversion    ? (Blank rows = not tested) ?*= pain ? ? ? ?TODAY'S TREATMENT:  ? 3/31: ?  Bike 5 minutes, Level  4 for dynamic warm up ?  Squat then heel raise 15x 3" ?  SLS Rt 22", LT 20" ?  Forward Lunge with UE support ?  Transition quadruped then stand for RTW duties x 2 ?  Vector stance 3x 5" ?  Stairs reciprocal pattern 5RT no HR  ?  Leg press 6Pl 2 x 10 slow and controlled ?AROM 1-118 ?   ? ? ?07/04/21 ?Bike 5 minutes, Level 4 for dynamic warm up ?Lateral step down 2x 10 6 inch step ?Forward step down 2x 10 6 inch step ?STS with black foam under RLE 3x 10 without UE support ?Forward lunge 2x 10 with unilateral UE support  ?Leg press 6Pl 2 x 10 slow and controlled ? ? ? ?06/28/21: ? Bike 5 minutes, Level 3 for dynamic warm up ? Squat then heel raise 10x 3" ? TKE 6 plates 10 x 5 second holds 2 ? Lateral and forward step up 6in each 15x each ? Step down 6in 15x ? SLS:  Rt 18", Lt 35" max ?Leg press 4Pl 10x slow and controlled ?Seated: heel slide with AAROM at end range 10x 10" ? STS 10 no HHA  ?Supine: AROM 3-118 degree ? ?06/27/21 ?Bike 5 minutes, Level 3 for dynamic warm  up ?Heel slides 10 x 10 second holds LLE ?Step up 4 inch step 2 x 10 without UE support ?Step up 6 inch step 2 x 10 with unilateral  UE support ?STS 2x 10 without UE support ?Lateral step down 2x 10 eccentric control  ?TKE 6 plates 10 x 5 second holds 2 sets ? ? ? ?HOME EXERCISE PROGRAM: ?quad set, heel slides supine and seated, ankle pumps, SLR. 3/22 STS, step up, lateral step down ? ? PT Short Term Goals   ? ?  ? PT SHORT TERM GOAL #1  ? Title Patient will report understanding and regular compliance with HEP to improve mobility and decrease pain.   ? Time 3   ? Period Weeks   ? Status Achieved  ? Target Date 06/25/21   ?  ? PT SHORT TERM GOAL #2  ? Title Demonstrate right knee extension 0 degrees and flexion 90 degrees to improve gait mechanics   ? Time 3   ? Period Weeks   ? Status Achieved lacking 3 to 115 on 06/27/21  ? Target Date 06/25/21   ? ?  ?  ? ?  ? ? ? PT Long Term Goals   ? ?  ? PT LONG TERM GOAL #1  ? Title Patient will report an improvement of at least 75% in overall symptoms  to improve QOL.   ? Time 6   ? Period Weeks   ? Status Ongoing   ? Target Date 07/16/21   ?  ? PT LONG TERM GOAL #2  ? Title Ambulate independently with normalized pattern and stairs with reciprocal pattern   ? Time 6   ? Period Weeks   ? Status Ongoing   ? Target Date 07/16/21   ?  ? PT LONG TERM GOAL #3  ? Title Patient will improve FOTO score by at least 30 points in order to indicate improved tolerance to activity.   ? Time 6   ? Period Weeks   ? Status Ongoing   ? Target Date 07/16/21   ?  ? PT LONG TERM GOAL #4  ? Title Patient will improve ROM for left knee extension/flexion to 0-115 degrees to improve squatting, and other functional  mobility.   ? Time 6   ? Period Weeks   ? Status Ongoing   ? Target Date 07/16/21   ? ?  ?  ? ?  ? ? ? ? ? Plan - 06/27/21 0847   ? ? Clinical Impression Statement Pt progressing well towards goals.  Added balance activities for knee stability with intermittent HHA required for LOB.  Pt able  to complete stairs reciprocal pattern with cueing to improve eccentric control for strengthening, visual quad fatigue descending stairs.  AROM 1-118 degrees.   ?  ? Personal Factors and Comorbidities Profession   ? Examination-Activity Limitations Bend;Carry;Lift;Toileting;Stairs;Squat;Sleep;Locomotion Level;Transfers   ? Examination-Participation Restrictions Cleaning;Community Activity;Occupation   ? Stability/Clinical Decision Making Stable/Uncomplicated   ? Rehab Potential Excellent   ? PT Frequency 2x / week   ? PT Duration 6 weeks   ? PT Treatment/Interventions ADLs/Self Care Home Management;Electrical Stimulation;DME Instruction;Gait training;Functional mobility training;Therapeutic activities;Therapeutic exercise;Balance training;Neuromuscular re-education;Patient/family education;Passive range of motion;Splinting;Taping;Dry needling;Energy conservation;Compression bandaging;Manual techniques;Orthotic Fit/Training;Stair training;Cryotherapy   ? PT Next Visit Plan   Progress L knee mobility, quad strength, progress as tolerated.   ? PT Home Exercise Plan quad set, heel slides supine and seated, ankle pumps; 3/17: gastroc and hamstring stretch 3/22 STS, step up, lateral step down 3/29 forward step down  ? Consulted and Agree with Plan of Care Patient   ? ?  ?  ? ?  ? ? ?Ihor Austin, LPTA/CLT; CBIS ?[phone] ? ?10:12 AM, 07/06/21 ? ? ? ?  ? ?

## 2021-07-13 ENCOUNTER — Ambulatory Visit (HOSPITAL_COMMUNITY): Payer: BC Managed Care – PPO | Attending: Orthopedic Surgery

## 2021-07-13 DIAGNOSIS — M25662 Stiffness of left knee, not elsewhere classified: Secondary | ICD-10-CM | POA: Diagnosis present

## 2021-07-13 DIAGNOSIS — M6281 Muscle weakness (generalized): Secondary | ICD-10-CM | POA: Diagnosis present

## 2021-07-13 DIAGNOSIS — R262 Difficulty in walking, not elsewhere classified: Secondary | ICD-10-CM | POA: Insufficient documentation

## 2021-07-13 DIAGNOSIS — M25562 Pain in left knee: Secondary | ICD-10-CM | POA: Insufficient documentation

## 2021-07-13 NOTE — Therapy (Signed)
?OUTPATIENT PHYSICAL THERAPY TREATMENT NOTE ? ? ?Patient Name: Riley Larson ?MRN: [id number] ?DOB:11-May-1970, 51 y.o., male ?Today's Date: 07/13/2021 ? ?PCP: Sharilyn Sites, MD ?REFERRING PROVIDER: Earlie Server MD ? ? PT End of Session - 07/13/21 1000   ? ? Visit Number 9   ? Number of Visits 12   ? Date for PT Re-Evaluation 07/16/21   ? Authorization Type Primary BCBS Comm PPO secondary: [place] focus   ? Progress Note Due on Visit 10   ? PT Start Time [phone]   ? Activity Tolerance Patient tolerated treatment well   ? Behavior During Therapy [facility] for tasks assessed/performed   ? ?  ?  ? ?  ? ? ? ? ?Past Medical History:  ?Diagnosis Date  ? Arthritis   ? Hypertension   ? ?Past Surgical History:  ?Procedure Laterality Date  ? BACK SURGERY    ? COLONOSCOPY WITH PROPOFOL N/A 03/14/2020  ? Procedure: COLONOSCOPY WITH PROPOFOL;  Surgeon: Harvel Quale, MD;  [facility];  Service: Gastroenterology;  Laterality: N/A;  9:15  ? LAPAROSCOPIC GASTRIC BANDING    ? left index finger surgery     ? TOTAL KNEE ARTHROPLASTY Right 03/09/2021  ? Procedure: TOTAL KNEE ARTHROPLASTY;  Surgeon: Earlie Server, MD;  Location: [facility];  Service: Orthopedics;  Laterality: Right;  ? TOTAL KNEE ARTHROPLASTY Left 06/01/2021  ? Procedure: TOTAL KNEE ARTHROPLASTY;  Surgeon: Earlie Server, MD;  Location: [facility];  Service: Orthopedics;  Laterality: Left;  ? ?Patient Active Problem List  ? Diagnosis Date Noted  ? CELLULITIS, LEFT HAND 10/16/2009  ? ? ?REFERRING DIAG:  ? L TKA   ? ? ?THERAPY DIAG:  ?Stiffness of left knee, not elsewhere classified ? ?Left knee pain, unspecified chronicity ? ?Muscle weakness (generalized) ? ?Difficulty in walking, not elsewhere classified ? ?PERTINENT HISTORY: s/p L TKA on 06/01/21 ? ?PRECAUTIONS: none ? ?SUBJECTIVE: Both knees are feeling stiff today.  He reports he has been a little slack this week; he usually going to the [facility] 3 times a week to ride the bike.  ? ?PAIN:  ?Are you having pain?  Yes: Pain location: Lt knee 0/10 ?Pain description: sore, tight ?Aggravating factors: morning, stiffness at night ?Relieving factors: rest ? ?Objective: ? ?LE ROM: ? ?Active ROM Right ?06/27/2021 Left ?06/27/2021 Left ?07/04/21 Lt 07/06/21  ?Hip flexion      ?Hip extension      ?Hip abduction      ?Hip adduction      ?Hip internal rotation      ?Hip external rotation      ?Knee flexion  115  113 in seated 118 supine  ?Knee extension  Lacking 3 Lacking 1 Lacking 1  ?Ankle dorsiflexion      ?Ankle plantarflexion      ?Ankle inversion      ?Ankle eversion      ? (Blank rows = not tested) ?*= pain ? ?LE MMT: ? ?MMT Right Left ?  ?Hip flexion    ?Hip extension    ?Hip abduction    ?Hip adduction    ?Hip internal rotation    ?Hip external rotation    ?Knee flexion    ?Knee extension    ?Ankle dorsiflexion    ?Ankle plantarflexion    ?Ankle inversion    ?Ankle eversion    ? (Blank rows = not tested) ?*= pain ? ? ? ?TODAY'S TREATMENT:  ?07/13/21 ?Bike 5 minutes, Level 4 for dynamic warm up ?  Squat then heel raise 15x 3" ?  SLS Rt 29", LT 28" ?  Forward Lunge with UE support 2 x 10 ?  TKE 6 plates 10 x  2 ?  Stairs reciprocal pattern 5RT no HR; good speed and technique ascending, some trouble with control descending but no LOB ?  Leg press 6Pl 2 x 10 slow and controlled ? ? ? 3/31: ?  Bike 5 minutes, Level 4 for dynamic warm up ?  Squat then heel raise 15x 3" ?  SLS Rt 22", LT 20" ?  Forward Lunge with UE support ?  Transition quadruped then stand for RTW duties x 2 ?  Vector stance 3x 5" ?  Stairs reciprocal pattern 5RT no HR  ?  Leg press 6Pl 2 x 10 slow and controlled ?AROM 1-118 ?   ? ? ?07/04/21 ?Bike 5 minutes, Level 4 for dynamic warm up ?Lateral step down 2x 10 6 inch step ?Forward step down 2x 10 6 inch step ?STS with black foam under RLE 3x 10 without UE support ?Forward lunge 2x 10 with unilateral UE support  ?Leg press 6Pl 2 x 10 slow and controlled ? ? ? ?06/28/21: ? Bike 5 minutes, Level 3 for dynamic warm up ? Squat  then heel raise 10x 3" ? TKE 6 plates 10 x 5 second holds 2 ? Lateral and forward step up 6in each 15x each ? Step down 6in 15x ? SLS:  Rt 18", Lt 35" max ?Leg press 4Pl 10x slow and controlled ?Seated: heel slide with AAROM at end range 10x 10" ? STS 10 no HHA  ?Supine: AROM 3-118 degree ? ?06/27/21 ?Bike 5 minutes, Level 3 for dynamic warm up ?Heel slides 10 x 10 second holds LLE ?Step up 4 inch step 2 x 10 without UE support ?Step up 6 inch step 2 x 10 with unilateral  UE support ?STS 2x 10 without UE support ?Lateral step down 2x 10 eccentric control  ?TKE 6 plates 10 x 5 second holds 2 sets ? ? ? ?HOME EXERCISE PROGRAM: ?quad set, heel slides supine and seated, ankle pumps, SLR. 3/22 STS, step up, lateral step down ? ? PT Short Term Goals   ? ?  ? PT SHORT TERM GOAL #1  ? Title Patient will report understanding and regular compliance with HEP to improve mobility and decrease pain.   ? Time 3   ? Period Weeks   ? Status Achieved  ? Target Date 06/25/21   ?  ? PT SHORT TERM GOAL #2  ? Title Demonstrate right knee extension 0 degrees and flexion 90 degrees to improve gait mechanics   ? Time 3   ? Period Weeks   ? Status Achieved lacking 3 to 115 on 06/27/21  ? Target Date 06/25/21   ? ?  ?  ? ?  ? ? ? PT Long Term Goals   ? ?  ? PT LONG TERM GOAL #1  ? Title Patient will report an improvement of at least 75% in overall symptoms  to improve QOL.   ? Time 6   ? Period Weeks   ? Status Ongoing   ? Target Date 07/16/21   ?  ? PT LONG TERM GOAL #2  ? Title Ambulate independently with normalized pattern and stairs with reciprocal pattern   ? Time 6   ? Period Weeks   ? Status Ongoing   ? Target Date 07/16/21   ?  ? PT LONG TERM  GOAL #3  ? Title Patient will improve FOTO score by at least 30 points in order to indicate improved tolerance to activity.   ? Time 6   ? Period Weeks   ? Status Ongoing   ? Target Date 07/16/21   ?  ? PT LONG TERM GOAL #4  ? Title Patient will improve ROM for left knee extension/flexion to 0-115  degrees to improve squatting, and other functional mobility.   ? Time 6   ? Period Weeks   ? Status Ongoing   ? Target Date 07/16/21   ? ?  ?  ? ?  ? ? ? ? ? Plan - 06/27/21 0847   ? ? Clinical Impression Statement Patient works hard in therapy, Patient arrives a little late for appointment due to weather/traffic. good speed and technique ascending stairs, some mild trouble with control and speed changes with descending but no LOB Therapy session today continues to work on mobility and strengthening of the Left knee.    ? Personal Factors and Comorbidities Profession   ? Examination-Activity Limitations Bend;Carry;Lift;Toileting;Stairs;Squat;Sleep;Locomotion Level;Transfers   ? Examination-Participation Restrictions Cleaning;Community Activity;Occupation   ? Stability/Clinical Decision Making Stable/Uncomplicated   ? Rehab Potential Excellent   ? PT Frequency 2x / week   ? PT Duration 6 weeks   ? PT Treatment/Interventions ADLs/Self Care Home Management;Electrical Stimulation;DME Instruction;Gait training;Functional mobility training;Therapeutic activities;Therapeutic exercise;Balance training;Neuromuscular re-education;Patient/family education;Passive range of motion;Splinting;Taping;Dry needling;Energy conservation;Compression bandaging;Manual techniques;Orthotic Fit/Training;Stair training;Cryotherapy   ? PT Next Visit Plan   Progress L knee mobility, quad strength, progress as tolerated. Try weighted squats.  ? PT Home Exercise Plan quad set, heel slides supine and seated, ankle pumps; 3/17: gastroc and hamstring stretch 3/22 STS, step up, lateral step down 3/29 forward step down  ? Consulted and Agree with Plan of Care Patient   ? ?  ?  ? ?  ? ? ?10:40 AM, 07/13/21 ?Aviance [facility] Small Zainah Steven MPT ?[facility] ?[place] [phone] ?Ph:[phone] ? ? ? ? ?  ? ?

## 2021-07-20 ENCOUNTER — Encounter (HOSPITAL_COMMUNITY): Payer: Self-pay | Admitting: Physical Therapy

## 2021-07-20 ENCOUNTER — Ambulatory Visit (HOSPITAL_COMMUNITY): Payer: BC Managed Care – PPO | Admitting: Physical Therapy

## 2021-07-20 DIAGNOSIS — M6281 Muscle weakness (generalized): Secondary | ICD-10-CM

## 2021-07-20 DIAGNOSIS — M25562 Pain in left knee: Secondary | ICD-10-CM

## 2021-07-20 DIAGNOSIS — M25662 Stiffness of left knee, not elsewhere classified: Secondary | ICD-10-CM | POA: Diagnosis not present

## 2021-07-20 DIAGNOSIS — R262 Difficulty in walking, not elsewhere classified: Secondary | ICD-10-CM

## 2021-07-20 NOTE — Therapy (Signed)
?OUTPATIENT PHYSICAL THERAPY TREATMENT NOTE ? ? ?Patient Name: Riley Larson ?MRN: [id number] ?DOB:[birth year], 51 y.o., male ?Today's Date: 07/20/2021 ? ?PCP: Sharilyn Sites, MD ?REFERRING PROVIDER: Earlie Server MD ?PHYSICAL THERAPY DISCHARGE SUMMARY ? ?Visits from Start of Care: 10 ? ?Current functional level related to goals / functional outcomes: ?ROM 0-122; strength good or normal ?  ?Remaining deficits: ?Some occasional pain not in his knee but where his femur was cut  ?  ?Education / Equipment: ?HEP  ? ?Patient agrees to discharge. Patient goals were met. Patient is being discharged due to being pleased with the current functional level.  ? PT End of Session - 07/20/21 0917   ? ? Visit Number 10   ? Number of Visits 10  ? Date for PT Re-Evaluation 07/16/21   ? Authorization Type Primary BCBS Comm PPO secondary: [place] focus   ? Progress Note Due on Visit 10   ? PT Start Time 0488   ? PT Stop Time 1000   ? PT Time Calculation (min) 40 min   ? ?  ?  ? ?  ? ? ? ? ?Past Medical History:  ?Diagnosis Date  ? Arthritis   ? Hypertension   ? ?Past Surgical History:  ?Procedure Laterality Date  ? BACK SURGERY    ? COLONOSCOPY WITH PROPOFOL N/A 03/14/2020  ? Procedure: COLONOSCOPY WITH PROPOFOL;  Surgeon: Harvel Quale, MD;  [facility];  Service: Gastroenterology;  Laterality: N/A;  9:15  ? LAPAROSCOPIC GASTRIC BANDING    ? left index finger surgery     ? TOTAL KNEE ARTHROPLASTY Right 03/09/2021  ? Procedure: TOTAL KNEE ARTHROPLASTY;  Surgeon: Earlie Server, MD;  [facility];  Service: Orthopedics;  Laterality: Right;  ? TOTAL KNEE ARTHROPLASTY Left 06/01/2021  ? Procedure: TOTAL KNEE ARTHROPLASTY;  Surgeon: Earlie Server, MD;  [facility];  Service: Orthopedics;  Laterality: Left;  ? ?Patient Active Problem List  ? Diagnosis Date Noted  ? CELLULITIS, LEFT HAND 10/16/2009  ? ? ?REFERRING DIAG:  ? L TKA   ? ? ?THERAPY DIAG:  ?Stiffness of left knee, not elsewhere classified ? ?Left  knee pain, unspecified chronicity ? ?Muscle weakness (generalized) ? ?Difficulty in walking, not elsewhere classified ? ?PERTINENT HISTORY: s/p L TKA on 06/01/21 ? ?PRECAUTIONS: none ?07/20/2021 ?SUBJECTIVE: Pt states that he has been hunting this week, the first time in six years.  ? ?PAIN:  no  ? ?Objective: ? ?LE ROM: ? ?Active ROM Right ?06/27/2021 Left ?06/27/2021 Left ?07/04/21 Lt 07/06/21 Lt  ?07/20/2021  ?Hip flexion       ?Hip extension       ?Hip abduction       ?Hip adduction       ?Hip internal rotation       ?Hip external rotation       ?Knee flexion  115  113 in seated 118 supine 122  ?Knee extension  Lacking 3 Lacking 1 Lacking 1 0  ?Ankle dorsiflexion       ?Ankle plantarflexion       ?Ankle inversion       ?Ankle eversion       ? (Blank rows = not tested) ?*= pain ? ?LE MMT: ? ?MMT Right Left ?  ?Hip flexion  5/5  ?Hip extension  5/5  ?Hip abduction  5/5  ?Hip adduction    ?Hip internal rotation    ?Hip external rotation    ?Knee flexion 5/5 4/5  ?Knee extension  5/5  ?Ankle dorsiflexion  5/5  ?Ankle plantarflexion  5/5  ?Ankle inversion    ?Ankle eversion    ? (Blank rows = not tested) ?*= pain ? ?Foto 80   ?Treatment:  ?Standing:   15 single leg heel raise Rt  ?Single leg stance: 59" on LT 29" ?Sit to stand 12 in 30"   ?Prone:  knee flexion with green tband x 10 ? ?TODAY'S TREATMENT:  ?07/13/21 ?Bike 5 minutes, Level 4 for dynamic warm up ?  Squat then heel raise 15x 3" ?  SLS Rt 29", LT 28" ?  Forward Lunge with UE support 2 x 10 ?  TKE 6 plates 10 x  2 ?  Stairs reciprocal pattern 5RT no HR; good speed and technique ascending, some trouble with control descending but no LOB ?  Leg press 6Pl 2 x 10 slow and controlled ? ? ? 3/31: ?  Bike 5 minutes, Level 4 for dynamic warm up ?  Squat then heel raise 15x 3" ?  SLS Rt 22", LT 20" ?  Forward Lunge with UE support ?  Transition quadruped then stand for RTW duties x 2 ?  Vector stance 3x 5" ?  Stairs reciprocal pattern 5RT no HR  ?  Leg press 6Pl 2 x 10 slow  and controlled ?AROM 1-118 ?   ? ? ?07/04/21 ?Bike 5 minutes, Level 4 for dynamic warm up ?Lateral step down 2x 10 6 inch step ?Forward step down 2x 10 6 inch step ?STS with black foam under RLE 3x 10 without UE support ?Forward lunge 2x 10 with unilateral UE support  ?Leg press 6Pl 2 x 10 slow and controlled ? ? ? ?06/28/21: ? Bike 5 minutes, Level 3 for dynamic warm up ? Squat then heel raise 10x 3" ? TKE 6 plates 10 x 5 second holds 2 ? Lateral and forward step up 6in each 15x each ? Step down 6in 15x ? SLS:  Rt 18", Lt 35" max ?Leg press 4Pl 10x slow and controlled ?Seated: heel slide with AAROM at end range 10x 10" ? STS 10 no HHA  ?Supine: AROM 3-118 degree ? ?06/27/21 ?Bike 5 minutes, Level 3 for dynamic warm up ?Heel slides 10 x 10 second holds LLE ?Step up 4 inch step 2 x 10 without UE support ?Step up 6 inch step 2 x 10 with unilateral  UE support ?STS 2x 10 without UE support ?Lateral step down 2x 10 eccentric control  ?TKE 6 plates 10 x 5 second holds 2 sets ? ? ? ?HOME EXERCISE PROGRAM: ?quad set, heel slides supine and seated, ankle pumps, SLR. 3/22 STS, step up, lateral step down ? ? PT Short Term Goals   ? ?  ? PT SHORT TERM GOAL #1  ? Title Patient will report understanding and regular compliance with HEP to improve mobility and decrease pain.   ? Time 3   ? Period Weeks   ? Status Achieved  ? Target Date 06/25/21   ?  ? PT SHORT TERM GOAL #2  ? Title Demonstrate right knee extension 0 degrees and flexion 90 degrees to improve gait mechanics   ? Time 3   ? Period Weeks   ? Status Achieved   ? Target Date 06/25/21   ? ?  ?  ? ?  ? ? ? PT Long Term Goals   ? ?  ? PT LONG TERM GOAL #1  ? Title Patient will report an improvement of at  least 75% in overall symptoms  to improve QOL.   ? Time 6   ? Period Weeks   ? Status  Ongoing   ? Target Date 07/16/21   ?  ? PT LONG TERM GOAL #2  ? Title Ambulate independently with normalized pattern and stairs with reciprocal pattern   ? Time 6   ? Period Weeks   ?  Status Achieved    ? Target Date 07/16/21   ?  ? PT LONG TERM GOAL #3  ? Title Patient will improve FOTO score by at least 30 points in order to indicate improved tolerance to activity.   ? Time 6   ? Period Weeks   ? Status Achieved   ? Target Date 07/16/21   ?  ? PT LONG TERM GOAL #4  ? Title Patient will improve ROM for left knee extension/flexion to 0-115 degrees to improve squatting, and other functional mobility.   ? Time 6   ? Period Weeks   ? Status Achieved   ? Target Date 07/16/21   ? ?  ?  ? ?  ? ? ? ? ? Plan - 06/27/21 0847   ? ? Clinical Impression Statement Pt reassessed all short term goals and 3/4 Long term goals have been met.  Pt is ready for discharge as he is not longer in need of skilled PT.    ? Personal Factors and Comorbidities Profession   ? Examination-Activity Limitations Bend;Carry;Lift;Toileting;Stairs;Squat;Sleep;Locomotion Level;Transfers   ? Examination-Participation Restrictions Cleaning;Community Activity;Occupation   ? Stability/Clinical Decision Making Stable/Uncomplicated   ? Rehab Potential Excellent   ? PT Frequency 2x / week   ? PT Duration 6 weeks   ? PT Treatment/Interventions ADLs/Self Care Home Management;Electrical Stimulation;DME Instruction;Gait training;Functional mobility training;Therapeutic activities;Therapeutic exercise;Balance training;Neuromuscular re-education;Patient/family education;Passive range of motion;Splinting;Taping;Dry needling;Energy conservation;Compression bandaging;Manual techniques;Orthotic Fit/Training;Stair training;Cryotherapy   ? PT Next Visit Plan   Progress L knee mobility, quad strength, progress as tolerated. Try weighted squats.  ? PT Home Exercise Plan quad set, heel slides supine and seated, ankle pumps; 3/17: gastroc and hamstring stretch 3/22 STS, step up, lateral step down 3/29 forward step down; 4/14 prone hamstring curl with green theraband.   ? Consulted and Agree with Plan of Care Patient   ? ?  ?  ? ?  ? ?Rayetta Humphrey, PT  CLT ?[phone]  ?1005 AM  ? ? ?  ? ?

## 2022-03-27 ENCOUNTER — Encounter (HOSPITAL_BASED_OUTPATIENT_CLINIC_OR_DEPARTMENT_OTHER): Payer: Self-pay | Admitting: Orthopedic Surgery

## 2022-03-28 ENCOUNTER — Ambulatory Visit: Payer: Self-pay | Admitting: Physician Assistant

## 2022-03-28 NOTE — H&P (View-Only) (Signed)
Riley Larson is an 51 y.o. male.   Chief Complaint: right shoulder pain HPI: : Patient is a 51 year-old male who is known to the office.  We had seen him for right shoulder pain and complaints.  We elected to try an injection and he got about a day or two of relief with this and then symptoms quickly returned.  Has a partial cuff, probable old biceps tendon rupture.  Some question of whether he may have a residual amount of stump of his biceps in his joint.  He had a subacromial injection and AC injection without significant relief.    Past Medical History:  Diagnosis Date  . Arthritis   . Hypertension     Past Surgical History:  Procedure Laterality Date  . BACK SURGERY    . COLONOSCOPY WITH PROPOFOL N/A 03/14/2020   Procedure: COLONOSCOPY WITH PROPOFOL;  Surgeon: Harvel Quale, MD;  [facility];  Service: Gastroenterology;  Laterality: N/A;  9:15  . LAPAROSCOPIC GASTRIC BANDING    . left index finger surgery     . TOTAL KNEE ARTHROPLASTY Right 03/09/2021   Procedure: TOTAL KNEE ARTHROPLASTY;  Surgeon: Earlie Server, MD;  Location: [facility];  Service: Orthopedics;  Laterality: Right;  . TOTAL KNEE ARTHROPLASTY Left 06/01/2021   Procedure: TOTAL KNEE ARTHROPLASTY;  Surgeon: Earlie Server, MD;  Location: [facility];  Service: Orthopedics;  Laterality: Left;    No family history on file. Social History:  reports that he has never smoked. He has quit using smokeless tobacco. He reports current alcohol use of about 2.0 standard drinks of alcohol per week. He reports that he does not use drugs.  Allergies: No Known Allergies  (Not in a hospital admission)   No results found for this or any previous visit (from the past 48 hour(s)). No results found.  Review of Systems  Musculoskeletal:  Positive for arthralgias.  All other systems reviewed and are negative.  There were no vitals taken for this visit. Physical Exam Constitutional:      Appearance: Normal  appearance.  HENT:     Head: Normocephalic and atraumatic.  Eyes:     Extraocular Movements: Extraocular movements intact.     Pupils: Pupils are equal, round, and reactive to light.  Cardiovascular:     Rate and Rhythm: Normal rate.     Pulses: Normal pulses.  Pulmonary:     Effort: Pulmonary effort is normal. No respiratory distress.  Abdominal:     General: Abdomen is flat. There is no distension.  Musculoskeletal:     Cervical back: Normal range of motion.     Comments: Examination of the right upper extremity reveals he is neurovascularly intact.  He has painful arc of motion, but decent range of motion of the right shoulder.  He has prominence over his [facility] joint.  Positive impingement, Hawkins testing, hyperabduction of the arm across the chest.  He does have pain with active and passive internal rotation.  Decent strength.  Resisted abduction and rotation.    Skin:    General: Skin is warm and dry.     Findings: No erythema or rash.  Neurological:     General: No focal deficit present.     Mental Status: He is alert and oriented to person, place, and time.  Psychiatric:        Mood and Affect: Mood normal.        Behavior: Behavior normal.     Assessment/Plan Right shoulder probable  rotator cuff tear and significant DJD AC joint   Had long discussion with patient regarding risks and benefits of right shoulder arthroscopy subacromial decompression and open distal clavicle resection possible open rotator cuff repair and he wishes to proceed.  We will set this up same day surgery.    Chriss Czar, PA-C 03/28/2022, 7:51 AM

## 2022-03-28 NOTE — H&P (Signed)
Riley Larson is an 51 y.o. male.   Chief Complaint: right shoulder pain HPI: : Patient is a 51 year-old male who is known to the office.  We had seen him for right shoulder pain and complaints.  We elected to try an injection and he got about a day or two of relief with this and then symptoms quickly returned.  Has a partial cuff, probable old biceps tendon rupture.  Some question of whether he may have a residual amount of stump of his biceps in his joint.  He had a subacromial injection and AC injection without significant relief.    Past Medical History:  Diagnosis Date  . Arthritis   . Hypertension     Past Surgical History:  Procedure Laterality Date  . BACK SURGERY    . COLONOSCOPY WITH PROPOFOL N/A 03/14/2020   Procedure: COLONOSCOPY WITH PROPOFOL;  Surgeon: Harvel Quale, MD;  [facility];  Service: Gastroenterology;  Laterality: N/A;  9:15  . LAPAROSCOPIC GASTRIC BANDING    . left index finger surgery     . TOTAL KNEE ARTHROPLASTY Right 03/09/2021   Procedure: TOTAL KNEE ARTHROPLASTY;  Surgeon: Earlie Server, MD;  [facility]: [facility];  Service: Orthopedics;  Laterality: Right;  . TOTAL KNEE ARTHROPLASTY Left 06/01/2021   Procedure: TOTAL KNEE ARTHROPLASTY;  Surgeon: Earlie Server, MD;  Location: [facility];  Service: Orthopedics;  Laterality: Left;    No family history on file. Social History:  reports that he has never smoked. He has quit using smokeless tobacco. He reports current alcohol use of about 2.0 standard drinks of alcohol per week. He reports that he does not use drugs.  Allergies: No Known Allergies  (Not in a hospital admission)   No results found for this or any previous visit (from the past 48 hour(s)). No results found.  Review of Systems  Musculoskeletal:  Positive for arthralgias.  All other systems reviewed and are negative.  There were no vitals taken for this visit. Physical Exam Constitutional:      Appearance: Normal  appearance.  HENT:     Head: Normocephalic and atraumatic.  Eyes:     Extraocular Movements: Extraocular movements intact.     Pupils: Pupils are equal, round, and reactive to light.  Cardiovascular:     Rate and Rhythm: Normal rate.     Pulses: Normal pulses.  Pulmonary:     Effort: Pulmonary effort is normal. No respiratory distress.  Abdominal:     General: Abdomen is flat. There is no distension.  Musculoskeletal:     Cervical back: Normal range of motion.     Comments: Examination of the right upper extremity reveals he is neurovascularly intact.  He has painful arc of motion, but decent range of motion of the right shoulder.  He has prominence over his [facility] joint.  Positive impingement, Hawkins testing, hyperabduction of the arm across the chest.  He does have pain with active and passive internal rotation.  Decent strength.  Resisted abduction and rotation.    Skin:    General: Skin is warm and dry.     Findings: No erythema or rash.  Neurological:     General: No focal deficit present.     Mental Status: He is alert and oriented to person, place, and time.  Psychiatric:        Mood and Affect: Mood normal.        Behavior: Behavior normal.     Assessment/Plan Right shoulder probable  rotator cuff tear and significant DJD AC joint   Had long discussion with patient regarding risks and benefits of right shoulder arthroscopy subacromial decompression and open distal clavicle resection possible open rotator cuff repair and he wishes to proceed.  We will set this up same day surgery.    Chriss Czar, PA-C 03/28/2022, 7:51 AM

## 2022-04-03 ENCOUNTER — Encounter (HOSPITAL_BASED_OUTPATIENT_CLINIC_OR_DEPARTMENT_OTHER)
Admission: RE | Admit: 2022-04-03 | Discharge: 2022-04-03 | Disposition: A | Discharge status: Home / Self Care | Payer: BC Managed Care – PPO | Source: Ambulatory Visit | Attending: Orthopedic Surgery | Admitting: Orthopedic Surgery

## 2022-04-03 DIAGNOSIS — I10 Essential (primary) hypertension: Secondary | ICD-10-CM | POA: Diagnosis not present

## 2022-04-03 DIAGNOSIS — Z01812 Encounter for preprocedural laboratory examination: Secondary | ICD-10-CM | POA: Insufficient documentation

## 2022-04-03 DIAGNOSIS — M19011 Primary osteoarthritis, right shoulder: Secondary | ICD-10-CM | POA: Diagnosis not present

## 2022-04-03 DIAGNOSIS — X58XXXA Exposure to other specified factors, initial encounter: Secondary | ICD-10-CM | POA: Diagnosis not present

## 2022-04-03 DIAGNOSIS — S46211A Strain of muscle, fascia and tendon of other parts of biceps, right arm, initial encounter: Secondary | ICD-10-CM | POA: Diagnosis not present

## 2022-04-03 DIAGNOSIS — S46011A Strain of muscle(s) and tendon(s) of the rotator cuff of right shoulder, initial encounter: Secondary | ICD-10-CM | POA: Diagnosis not present

## 2022-04-03 LAB — BASIC METABOLIC PANEL
Anion gap: 10 (ref 5–15)
BUN: 7 mg/dL (ref 6–20)
CO2: 28 mmol/L (ref 22–32)
Calcium: 9 mg/dL (ref 8.9–10.3)
Chloride: 95 mmol/L — ABNORMAL LOW (ref 98–111)
Creatinine, Ser: 0.72 mg/dL (ref 0.61–1.24)
GFR, Estimated: >60 mL/min (ref >60)
Glucose, Bld: 99 mg/dL (ref 70–99)
Potassium: 3.9 mmol/L (ref 3.5–5.1)
Sodium: 133 mmol/L — ABNORMAL LOW (ref 135–145)

## 2022-04-03 NOTE — Progress Notes (Signed)

## 2022-04-05 ENCOUNTER — Other Ambulatory Visit: Payer: Self-pay

## 2022-04-05 ENCOUNTER — Ambulatory Visit (HOSPITAL_BASED_OUTPATIENT_CLINIC_OR_DEPARTMENT_OTHER)
Admission: RE | Admit: 2022-04-05 | Discharge: 2022-04-05 | Disposition: A | Discharge status: Home / Self Care | Payer: BC Managed Care – PPO | Attending: Orthopedic Surgery | Admitting: Orthopedic Surgery

## 2022-04-05 ENCOUNTER — Encounter (HOSPITAL_BASED_OUTPATIENT_CLINIC_OR_DEPARTMENT_OTHER): Payer: Self-pay | Admitting: Orthopedic Surgery

## 2022-04-05 ENCOUNTER — Ambulatory Visit (HOSPITAL_BASED_OUTPATIENT_CLINIC_OR_DEPARTMENT_OTHER): Payer: BC Managed Care – PPO | Admitting: Certified Registered"

## 2022-04-05 ENCOUNTER — Encounter (HOSPITAL_BASED_OUTPATIENT_CLINIC_OR_DEPARTMENT_OTHER)
Admission: RE | Disposition: A | Discharge status: Home / Self Care | Payer: Self-pay | Source: Home / Self Care | Attending: Orthopedic Surgery

## 2022-04-05 DIAGNOSIS — S46011A Strain of muscle(s) and tendon(s) of the rotator cuff of right shoulder, initial encounter: Secondary | ICD-10-CM | POA: Insufficient documentation

## 2022-04-05 DIAGNOSIS — Z01818 Encounter for other preprocedural examination: Secondary | ICD-10-CM

## 2022-04-05 DIAGNOSIS — S46211A Strain of muscle, fascia and tendon of other parts of biceps, right arm, initial encounter: Secondary | ICD-10-CM | POA: Insufficient documentation

## 2022-04-05 DIAGNOSIS — I10 Essential (primary) hypertension: Secondary | ICD-10-CM | POA: Insufficient documentation

## 2022-04-05 DIAGNOSIS — M19011 Primary osteoarthritis, right shoulder: Secondary | ICD-10-CM | POA: Insufficient documentation

## 2022-04-05 DIAGNOSIS — X58XXXA Exposure to other specified factors, initial encounter: Secondary | ICD-10-CM | POA: Insufficient documentation

## 2022-04-05 SURGERY — SHOULDER ARTHROSCOPY WITH SUBACROMIAL DECOMPRESSION AND DISTAL CLAVICLE EXCISION
Anesthesia: General | Site: Shoulder | Laterality: Right

## 2022-04-05 MED ORDER — SUGAMMADEX SODIUM 500 MG/5ML IV SOLN
INTRAVENOUS | Status: AC
  Filled 2022-04-05: qty 5

## 2022-04-05 MED ORDER — SODIUM CHLORIDE 0.9 % IV SOLN: INTRAVENOUS | Status: DC

## 2022-04-05 MED ORDER — ATROPINE SULFATE 0.4 MG/ML IV SOLN
INTRAVENOUS | Status: AC
  Filled 2022-04-05: qty 1

## 2022-04-05 MED ORDER — MIDAZOLAM HCL 2 MG/2ML IJ SOLN
INTRAMUSCULAR | Status: AC
  Filled 2022-04-05: qty 2

## 2022-04-05 MED ORDER — BUPIVACAINE-EPINEPHRINE (PF) 0.5% -1:200000 IJ SOLN
INTRAMUSCULAR | Status: DC | PRN
  Administered 2022-04-05: 15 mL via PERINEURAL

## 2022-04-05 MED ORDER — PHENYLEPHRINE HCL (PRESSORS) 10 MG/ML IV SOLN
INTRAVENOUS | Status: AC
  Filled 2022-04-05: qty 1

## 2022-04-05 MED ORDER — AMISULPRIDE (ANTIEMETIC) 5 MG/2ML IV SOLN: 10.0000 mg | Freq: Once | INTRAVENOUS | Status: DC | PRN

## 2022-04-05 MED ORDER — FENTANYL CITRATE (PF) 100 MCG/2ML IJ SOLN
INTRAMUSCULAR | Status: AC
  Filled 2022-04-05: qty 2

## 2022-04-05 MED ORDER — ROCURONIUM BROMIDE 100 MG/10ML IV SOLN
INTRAVENOUS | Status: DC | PRN
  Administered 2022-04-05: 60 mg via INTRAVENOUS

## 2022-04-05 MED ORDER — OXYCODONE HCL 5 MG PO TABS: ORAL_TABLET | ORAL | 0 refills | Status: AC

## 2022-04-05 MED ORDER — CEFAZOLIN SODIUM-DEXTROSE 2-4 GM/100ML-% IV SOLN
2.0000 g | INTRAVENOUS | Status: AC
  Administered 2022-04-05: 2 g via INTRAVENOUS

## 2022-04-05 MED ORDER — LACTATED RINGERS IV SOLN: INTRAVENOUS | Status: DC

## 2022-04-05 MED ORDER — FENTANYL CITRATE (PF) 100 MCG/2ML IJ SOLN
100.0000 ug | Freq: Once | INTRAMUSCULAR | Status: AC
  Administered 2022-04-05: 50 ug via INTRAVENOUS

## 2022-04-05 MED ORDER — FENTANYL CITRATE (PF) 100 MCG/2ML IJ SOLN
INTRAMUSCULAR | Status: DC | PRN
  Administered 2022-04-05: 100 ug via INTRAVENOUS

## 2022-04-05 MED ORDER — METHOCARBAMOL 500 MG PO TABS: 500.0000 mg | ORAL_TABLET | Freq: Four times a day (QID) | ORAL | 0 refills | Status: AC | PRN

## 2022-04-05 MED ORDER — ONDANSETRON HCL 4 MG/2ML IJ SOLN
INTRAMUSCULAR | Status: AC
  Filled 2022-04-05: qty 2

## 2022-04-05 MED ORDER — PROPOFOL 10 MG/ML IV BOLUS
INTRAVENOUS | Status: AC
  Filled 2022-04-05: qty 20

## 2022-04-05 MED ORDER — FENTANYL CITRATE (PF) 100 MCG/2ML IJ SOLN: 25.0000 ug | INTRAMUSCULAR | Status: DC | PRN

## 2022-04-05 MED ORDER — DEXAMETHASONE SODIUM PHOSPHATE 10 MG/ML IJ SOLN
INTRAMUSCULAR | Status: DC | PRN
  Administered 2022-04-05: 10 mg via INTRAVENOUS

## 2022-04-05 MED ORDER — CELECOXIB 200 MG PO CAPS
200.0000 mg | ORAL_CAPSULE | Freq: Once | ORAL | Status: AC
  Administered 2022-04-05: 200 mg via ORAL

## 2022-04-05 MED ORDER — ONDANSETRON HCL 4 MG/2ML IJ SOLN
INTRAMUSCULAR | Status: DC | PRN
  Administered 2022-04-05: 4 mg via INTRAVENOUS

## 2022-04-05 MED ORDER — PROPOFOL 10 MG/ML IV BOLUS
INTRAVENOUS | Status: DC | PRN
  Administered 2022-04-05: 200 mg via INTRAVENOUS

## 2022-04-05 MED ORDER — ACETAMINOPHEN 500 MG PO TABS
ORAL_TABLET | ORAL | Status: AC
  Filled 2022-04-05: qty 2

## 2022-04-05 MED ORDER — DEXAMETHASONE SODIUM PHOSPHATE 10 MG/ML IJ SOLN
INTRAMUSCULAR | Status: AC
  Filled 2022-04-05: qty 1

## 2022-04-05 MED ORDER — LIDOCAINE 2% (20 MG/ML) 5 ML SYRINGE
INTRAMUSCULAR | Status: AC
  Filled 2022-04-05: qty 5

## 2022-04-05 MED ORDER — SUGAMMADEX SODIUM 500 MG/5ML IV SOLN
INTRAVENOUS | Status: DC | PRN
  Administered 2022-04-05: 250 mg via INTRAVENOUS

## 2022-04-05 MED ORDER — ACETAMINOPHEN 500 MG PO TABS: 1000.0000 mg | ORAL_TABLET | Freq: Four times a day (QID) | ORAL | 2 refills | Status: AC | PRN

## 2022-04-05 MED ORDER — CELECOXIB 200 MG PO CAPS: 200.0000 mg | ORAL_CAPSULE | Freq: Two times a day (BID) | ORAL | 0 refills | Status: AC

## 2022-04-05 MED ORDER — CEFAZOLIN SODIUM-DEXTROSE 2-4 GM/100ML-% IV SOLN
INTRAVENOUS | Status: AC
  Filled 2022-04-05: qty 100

## 2022-04-05 MED ORDER — MIDAZOLAM HCL 2 MG/2ML IJ SOLN
2.0000 mg | Freq: Once | INTRAMUSCULAR | Status: AC
  Administered 2022-04-05: 2 mg via INTRAVENOUS

## 2022-04-05 MED ORDER — ROCURONIUM BROMIDE 10 MG/ML (PF) SYRINGE
PREFILLED_SYRINGE | INTRAVENOUS | Status: AC
  Filled 2022-04-05: qty 10

## 2022-04-05 MED ORDER — CELECOXIB 200 MG PO CAPS
ORAL_CAPSULE | ORAL | Status: AC
  Filled 2022-04-05: qty 1

## 2022-04-05 MED ORDER — ACETAMINOPHEN 500 MG PO TABS
1000.0000 mg | ORAL_TABLET | Freq: Once | ORAL | Status: AC
  Administered 2022-04-05: 1000 mg via ORAL

## 2022-04-05 MED ORDER — BUPIVACAINE LIPOSOME 1.3 % IJ SUSP
INTRAMUSCULAR | Status: DC | PRN
  Administered 2022-04-05: 10 mL via PERINEURAL

## 2022-04-05 MED ORDER — SODIUM CHLORIDE 0.9 % IR SOLN
Status: DC | PRN
  Administered 2022-04-05: 3000 mL

## 2022-04-05 SURGICAL SUPPLY — 77 items
AID PSTN UNV HD RSTRNT DISP (MISCELLANEOUS) ×1
ANCH SUT 2 SWLK 19.1 CLS EYLT (Anchor) ×4 IMPLANT
ANCHOR SUT BIO SW 4.75X19.1 (Anchor) IMPLANT
ANCHOR SWIVELOCK BIO 4.75X19.1 (Anchor) IMPLANT
APL SKNCLS STERI-STRIP NONHPOA (GAUZE/BANDAGES/DRESSINGS) ×1
BENZOIN TINCTURE PRP APPL 2/3 (GAUZE/BANDAGES/DRESSINGS) IMPLANT
BLADE AVERAGE 25X9 (BLADE) IMPLANT
BLADE SURG 15 STRL LF DISP TIS (BLADE) IMPLANT
BLADE SURG 15 STRL SS (BLADE) ×1
BUR EGG 3PK/BX (BURR) IMPLANT
BURR OVAL 8 FLU 4.0X13 (MISCELLANEOUS) IMPLANT
CANNULA 5.75X71 LONG (CANNULA) IMPLANT
CANNULA SHOULDER 7CM (CANNULA) ×2 IMPLANT
CANNULA TWIST IN 8.25X7CM (CANNULA) IMPLANT
CLEANER CAUTERY TIP 5X5 PAD (MISCELLANEOUS) IMPLANT
DISSECTOR  3.8MM X 13CM (MISCELLANEOUS)
DISSECTOR 3.8MM X 13CM (MISCELLANEOUS) IMPLANT
DISSECTOR 4.0MM X 13CM (MISCELLANEOUS) IMPLANT
DRAPE POUCH INSTRU U-SHP 10X18 (DRAPES) ×2 IMPLANT
DRAPE STERI 35X30 U-POUCH (DRAPES) ×2 IMPLANT
DRAPE SURG 17X23 STRL (DRAPES) ×2 IMPLANT
DRAPE U-SHAPE 76X120 STRL (DRAPES) ×4 IMPLANT
DRESSING MEPILEX FLEX 4X4 (GAUZE/BANDAGES/DRESSINGS) IMPLANT
DRSG EMULSION OIL 3X3 NADH (GAUZE/BANDAGES/DRESSINGS) ×2 IMPLANT
DRSG MEPILEX FLEX 4X4 (GAUZE/BANDAGES/DRESSINGS)
DRSG MEPILEX POST OP 4X8 (GAUZE/BANDAGES/DRESSINGS) IMPLANT
DURAPREP 26ML APPLICATOR (WOUND CARE) ×2 IMPLANT
ELECT REM PT RETURN 9FT ADLT (ELECTROSURGICAL) ×1
ELECTRODE REM PT RTRN 9FT ADLT (ELECTROSURGICAL) ×2 IMPLANT
GAUZE PAD ABD 8X10 STRL (GAUZE/BANDAGES/DRESSINGS) ×2 IMPLANT
GAUZE SPONGE 4X4 12PLY STRL (GAUZE/BANDAGES/DRESSINGS) ×2 IMPLANT
GLOVE BIO SURGEON STRL SZ7.5 (GLOVE) ×2 IMPLANT
GLOVE BIOGEL PI IND STRL 8 (GLOVE) ×4 IMPLANT
GLOVE SURG ORTHO 8.0 STRL STRW (GLOVE) ×2 IMPLANT
GOWN STRL REUS W/ TWL LRG LVL3 (GOWN DISPOSABLE) ×2 IMPLANT
GOWN STRL REUS W/ TWL XL LVL3 (GOWN DISPOSABLE) ×2 IMPLANT
GOWN STRL REUS W/TWL LRG LVL3 (GOWN DISPOSABLE) ×1
GOWN STRL REUS W/TWL XL LVL3 (GOWN DISPOSABLE) ×4 IMPLANT
MANIFOLD NEPTUNE II (INSTRUMENTS) ×2 IMPLANT
NDL 1/2 CIR CATGUT .05X1.09 (NEEDLE) IMPLANT
NDL SCORPION MULTI FIRE (NEEDLE) IMPLANT
NEEDLE 1/2 CIR CATGUT .05X1.09 (NEEDLE) IMPLANT
NEEDLE SCORPION MULTI FIRE (NEEDLE) ×1 IMPLANT
NS IRRIG 1000ML POUR BTL (IV SOLUTION) ×2 IMPLANT
PACK ARTHROSCOPY DSU (CUSTOM PROCEDURE TRAY) ×2 IMPLANT
PACK BASIN DAY SURGERY FS (CUSTOM PROCEDURE TRAY) ×2 IMPLANT
PAD ORTHO SHOULDER 7X19 LRG (SOFTGOODS) IMPLANT
PENCIL SMOKE EVACUATOR (MISCELLANEOUS) IMPLANT
PORT APPOLLO RF 90DEGREE MULTI (SURGICAL WAND) IMPLANT
RESTRAINT HEAD UNIVERSAL NS (MISCELLANEOUS) ×2 IMPLANT
SLEEVE SCD COMPRESS KNEE MED (STOCKING) ×2 IMPLANT
SLING ARM FOAM STRAP LRG (SOFTGOODS) IMPLANT
SLING ULTRA II MEDIUM (SOFTGOODS) IMPLANT
SLING ULTRA II SMALL (SOFTGOODS) IMPLANT
SPIKE FLUID TRANSFER (MISCELLANEOUS) IMPLANT
SPONGE T-LAP 4X18 ~~LOC~~+RFID (SPONGE) IMPLANT
STRIP CLOSURE SKIN 1/2X4 (GAUZE/BANDAGES/DRESSINGS) IMPLANT
SUCTION FRAZIER HANDLE 10FR (MISCELLANEOUS) ×1
SUCTION TUBE FRAZIER 10FR DISP (MISCELLANEOUS) IMPLANT
SUT BONE WAX W31G (SUTURE) IMPLANT
SUT ETHILON 3 0 PS 1 (SUTURE) ×2 IMPLANT
SUT FIBERWIRE #2 38 T-5 BLUE (SUTURE)
SUT MNCRL AB 3-0 PS2 18 (SUTURE) IMPLANT
SUT TICRON 1 T 12 (SUTURE) IMPLANT
SUT TIGER TAPE 7 IN WHITE (SUTURE) IMPLANT
SUT VIC AB 0 CT1 27 (SUTURE) ×1
SUT VIC AB 0 CT1 27XBRD ANBCTR (SUTURE) IMPLANT
SUT VIC AB 1 CT1 27 (SUTURE)
SUT VIC AB 1 CT1 27XBRD ANBCTR (SUTURE) IMPLANT
SUT VIC AB 2-0 SH 27 (SUTURE) ×1
SUT VIC AB 2-0 SH 27XBRD (SUTURE) IMPLANT
SUTURE FIBERWR #2 38 T-5 BLUE (SUTURE) IMPLANT
TAPE FIBER 2MM 7IN #2 BLUE (SUTURE) IMPLANT
TOWEL GREEN STERILE FF (TOWEL DISPOSABLE) ×2 IMPLANT
TUBING ARTHROSCOPY IRRIG 16FT (MISCELLANEOUS) ×2 IMPLANT
WATER STERILE IRR 1000ML POUR (IV SOLUTION) ×2 IMPLANT
YANKAUER SUCT BULB TIP NO VENT (SUCTIONS) IMPLANT

## 2022-04-05 NOTE — Anesthesia Preprocedure Evaluation (Signed)
Anesthesia Evaluation  Patient identified by MRN, date of birth, ID band Patient awake    Reviewed: Allergy & Precautions, NPO status , Patient's Chart, lab work & pertinent test results  History of Anesthesia Complications Negative for: history of anesthetic complications  Airway Mallampati: I  TM Distance: >3 FB Neck ROM: Full    Dental  (+) Dental Advisory Given   Pulmonary neg pulmonary ROS   breath sounds clear to auscultation       Cardiovascular hypertension, Pt. on medications (-) angina  Rhythm:Regular Rate:Normal     Neuro/Psych negative neurological ROS     GI/Hepatic Neg liver ROS,neg GERD  ,,H/o lap band   Endo/Other  obese  Renal/GU negative Renal ROS     Musculoskeletal  (+) Arthritis , Osteoarthritis,    Abdominal  (+) + obese  Peds  Hematology negative hematology ROS (+)   Anesthesia Other Findings   Reproductive/Obstetrics                             Anesthesia Physical Anesthesia Plan  ASA: 2  Anesthesia Plan: General   Post-op Pain Management: Regional block*, Tylenol PO (pre-op)* and Celebrex PO (pre-op)*   Induction:   PONV Risk Score and Plan: 2 and Ondansetron, Treatment may vary due to age or medical condition and Dexamethasone  Airway Management Planned: Oral ETT  Additional Equipment: None  Intra-op Plan:   Post-operative Plan: Extubation in OR  Informed Consent: I have reviewed the patients History and Physical, chart, labs and discussed the procedure including the risks, benefits and alternatives for the proposed anesthesia with the patient or authorized representative who has indicated his/her understanding and acceptance.     Dental advisory given  Plan Discussed with: CRNA and Surgeon  Anesthesia Plan Comments: (Plan routine monitors, SAB with adductor canal block for post op analgesia)        Anesthesia Quick Evaluation

## 2022-04-05 NOTE — Anesthesia Procedure Notes (Signed)
Procedure Name: Intubation Date/Time: 04/05/2022 11:12 AM  Performed by: Lavonia Dana, CRNAPre-anesthesia Checklist: Patient identified, Emergency Drugs available, Suction available and Patient being monitored Patient Re-evaluated:Patient Re-evaluated prior to induction Oxygen Delivery Method: Circle system utilized Preoxygenation: Pre-oxygenation with 100% oxygen Induction Type: IV induction Ventilation: Mask ventilation without difficulty and Oral airway inserted - appropriate to patient size Laryngoscope Size: Mac and 4 Grade View: Grade I Tube type: Oral Tube size: 7.5 mm Number of attempts: 1 Airway Equipment and Method: Stylet, Oral airway and Bite block Placement Confirmation: ETT inserted through vocal cords under direct vision, positive ETCO2 and breath sounds checked- equal and bilateral Secured at: 23 cm Tube secured with: Tape Dental Injury: Teeth and Oropharynx as per pre-operative assessment

## 2022-04-05 NOTE — Discharge Instructions (Addendum)
Diet: As you were doing prior to hospitalization   Activity: Increase activity slowly as tolerated  No lifting or driving for 48 hours  Shower: May shower without a dressing on post op day #5, NO SOAKING in tub   Dressing: You may change your dressing on post op day #3.  Then change the dressing daily with sterile 4"x4"s gauze dressing   Weight Bearing: no lifting with operative arm.  Range of motion as tolerated out of sling after first 48 hours.   To prevent constipation: you may use a stool softener such as -  Colace ( over the counter) 100 mg by mouth twice a day  Drink plenty of fluids ( prune juice may be helpful) and high fiber foods  Miralax ( over the counter) for constipation as needed.   Precautions: If you experience chest pain or shortness of breath - call 911 immediately For transfer to the hospital emergency department!!  If you develop a fever greater that 101 F, purulent drainage from wound, increased redness or drainage from wound, or calf pain -- Call the office   Follow- Up Appointment: Please call for an appointment to be seen in 1 weeks or as previously set up [facility] - [phone]    No Tylenol before 3:30pm if needed. No ibuprofen before 5:30pm if needed.  Post Anesthesia Home Care Instructions  Activity: Get plenty of rest for the remainder of the day. A responsible individual must stay with you for 24 hours following the procedure.  For the next 24 hours, DO NOT: -Drive a car -Paediatric nurse -Drink alcoholic beverages -Take any medication unless instructed by your physician -Make any legal decisions or sign important papers.  Meals: Start with liquid foods such as gelatin or soup. Progress to regular foods as tolerated. Avoid greasy, spicy, heavy foods. If nausea and/or vomiting occur, drink only clear liquids until the nausea and/or vomiting subsides. Call your physician if vomiting continues.  Special Instructions/Symptoms: Your throat  may feel dry or sore from the anesthesia or the breathing tube placed in your throat during surgery. If this causes discomfort, gargle with warm salt water. The discomfort should disappear within 24 hours.     Regional Anesthesia Blocks  1. Numbness or the inability to move the "blocked" extremity may last from 3-48 hours after placement. The length of time depends on the medication injected and your individual response to the medication. If the numbness is not going away after 48 hours, call your surgeon.  2. The extremity that is blocked will need to be protected until the numbness is gone and the  Strength has returned. Because you cannot feel it, you will need to take extra care to avoid injury. Because it may be weak, you may have difficulty moving it or using it. You may not know what position it is in without looking at it while the block is in effect.  3. For blocks in the legs and feet, returning to weight bearing and walking needs to be done carefully. You will need to wait until the numbness is entirely gone and the strength has returned. You should be able to move your leg and foot normally before you try and bear weight or walk. You will need someone to be with you when you first try to ensure you do not fall and possibly risk injury.  4. Bruising and tenderness at the needle site are common side effects and will resolve in a few days.  5. Persistent numbness or  new problems with movement should be communicated to the surgeon or the [place] [phone] [place] [phone]). DeRoyal Abductor sling instructions and diagram: (Blue ball)  Youtube video: https://youtu.be/[email or web address]  Please contact your surgeon's office if you have any questions about this shoulder immobilizer.      DeRoyal Abductor sling instructions and diagram: (Blue ball)  Youtube video: https://youtu.be/[email or web address]  Please contact your surgeon's office if you have any  questions about this shoulder immobilizer.

## 2022-04-05 NOTE — Op Note (Signed)
NAME: Riley Larson, Riley Larson MEDICAL RECORD NO: [id number] ACCOUNT NO: [id number] DATE OF BIRTH: Dec 25, 1970 FACILITY: [facility] LOCATION: [facility] PHYSICIAN: W D. Valeta Harms., MD  Operative Report   DATE OF PROCEDURE: 04/05/2022  PREOPERATIVE DIAGNOSES: 1.  Severe partial rotator cuff tear. 2.  Impingement. 3.  Acromioclavicular arthritis. 4.  Degenerative tearing anterior superior labrum. 5.  Complete tear of biceps tendon.   POSTOPERATIVE DIAGNOSES: 1.  Severe partial rotator cuff tear. 2.  Impingement. 3.  Acromioclavicular arthritis. 4.  Degenerative tearing anterior superior labrum. 5.  Complete tear of biceps tendon.  OPERATION:   1.  Rotator cuff repair and acromioplasty (open). 2.  Open distal clavicle excision. 3.  Arthroscopic debridement, labrum and biceps tendon all for the right shoulder.  SURGEON:  W D. Valeta Harms., MD.  ASSISTANTMarjo Bicker, PA.  ANESTHESIA:  General with block.  DESCRIPTION OF PROCEDURE:  Arthroscoped in the beach chair position with posterior lateral and anterior portals.  Systematic inspection of the shoulder showed a complete biceps tendon rupture with a residual piece of biceps impinging in the joint, which  was debrided.  A large pasta lesion was noted with nearly complete tear of the supraspinatus with some involvement of the infraspinatus.  We assessed the subacromial space arthroscopically and noted severe impingement from the CA ligament edge of the  acromion as well as the acromioclavicular joint.  Decision was made after intra-articular debridement of the labrum and biceps with the arthroscope to do an open approach to the shoulder with an incision, bisecting the acromial AC interval.  We split the deltoid approximately 3 cm distal to its  insertion.  The [facility] joint was severely arthritic.  We excised about a cm of the distal clavicle.  The CA ligament was very prominent with the hook, but very thin acromion.  We released the CA ligament and  performed an acromioplasty.  The rotator cuff was  very thin.  It was nearly a complete tear.  We elected to take the last mm or two of the cuff down to convert this to a complete tear followed by burring of the tuberosity to create a reattachment site for the cuff.  We then used an Arthrex SwiveLocks  4.75 SwiveLocks double-armed in a medial and lateral row technique placing four fiber strands through the cuff and attaching them to the lateral tuberosity.  This created a nearly watertight repair.  The split in the deltoid was closed with #1 TI Cron,  subcutaneous tissue with 2-0 Vicryl, skin with Monocryl placed in a pillow splint, taken to recovery room in stable condition.   PUS D: 04/05/2022 12:58:44 pm T: 04/05/2022 1:27:00 pm  JOB: [id number]/ [id number]

## 2022-04-05 NOTE — Interval H&P Note (Signed)
History and Physical Interval Note:  04/05/2022 9:23 AM  Riley Larson  has presented today for surgery, with the diagnosis of [place].  The various methods of treatment have been discussed with the patient and family. After consideration of risks, benefits and other options for treatment, the patient has consented to  Procedure(s): SHOULDER ARTHROSCOPY WITH SUBACROMIAL DECOMPRESSION, OPEN DISTAL CLAVICLE EXCISION, POSSIBLE OPEN ROTATOR CUFF REPAIR (Right) as a surgical intervention.  The patient's history has been reviewed, patient examined, no change in status, stable for surgery.  I have reviewed the patient's chart and labs.  Questions were answered to the patient's satisfaction.     Yvette Rack

## 2022-04-05 NOTE — Anesthesia Procedure Notes (Signed)
Anesthesia Regional Block: Interscalene brachial plexus block   Pre-Anesthetic Checklist: , timeout performed,  Correct Patient, Correct Site, Correct Laterality,  Correct Procedure, Correct Position, site marked,  Risks and benefits discussed,  Surgical consent,  Pre-op evaluation,  At surgeon's request and post-op pain management  Laterality: Right  Prep: chloraprep       Needles:  Injection technique: Single-shot  Needle Type: Echogenic Stimulator Needle     Needle Length: 9cm  Needle Gauge: 21     Additional Needles:   Procedures:,,,, ultrasound used (permanent image in chart),,     Nerve Stimulator or Paresthesia:  Response: deltoid, 0.5 mA  Additional Responses:   Narrative:  Start time: 04/05/2022 10:10 AM End time: 04/05/2022 10:15 AM Injection made incrementally with aspirations every 5 mL.  Performed by: Personally  Anesthesiologist: Suzette Battiest, MD

## 2022-04-05 NOTE — Brief Op Note (Signed)
04/05/2022  1:04 PM  PATIENT:  Riley Larson  51 y.o. male  PRE-OPERATIVE DIAGNOSIS:  RIGHT SHOULDER ROTATOR CUFF TEAR  POST-OPERATIVE DIAGNOSIS:  * No post-op diagnosis entered *  PROCEDURE:  Procedure(s): SHOULDER ARTHROSCOPY WITH SUBACROMIAL DECOMPRESSION AND DEBRIDEMENT, OPEN DISTAL CLAVICLE EXCISION, OPEN ROTATOR CUFF REPAIR, ACROMIOPLASTY (Right)  SURGEON:  Surgeon(s) and Role:    Earlie Server, MD - Primary  PHYSICIAN ASSISTANT: Chriss Czar, PA-C  ASSISTANTS:    ANESTHESIA:   regional and general  EBL:  25 mL   BLOOD ADMINISTERED:none  DRAINS: none   LOCAL MEDICATIONS USED:  NONE  SPECIMEN:  No Specimen  DISPOSITION OF SPECIMEN:  N/A  COUNTS:  YES  TOURNIQUET:  * No tourniquets in log *  DICTATION: .Other Dictation: Dictation Number unknown  PLAN OF CARE: Discharge to home after PACU  PATIENT DISPOSITION:  PACU - hemodynamically stable.   Delay start of Pharmacological VTE agent (>24hrs) due to surgical blood loss or risk of bleeding: not applicable

## 2022-04-05 NOTE — Transfer of Care (Signed)
Immediate Anesthesia Transfer of Care Note  Patient: MARIANA GOYTIA  Procedure(s) Performed: SHOULDER ARTHROSCOPY WITH SUBACROMIAL DECOMPRESSION AND DEBRIDEMENT, OPEN DISTAL CLAVICLE EXCISION, OPEN ROTATOR CUFF REPAIR, ACROMIOPLASTY (Right: Shoulder)  Patient Location: PACU  Anesthesia Type:GA combined with regional for post-op pain  Level of Consciousness: drowsy  Airway & Oxygen Therapy: Patient Spontanous Breathing and Patient connected to face mask oxygen  Post-op Assessment: Report given to RN and Post -op Vital signs reviewed and stable  Post vital signs: Reviewed and stable  Last Vitals:   BP: 164/92 (111) RR: 17 Vitals Value Taken Time  BP    Temp    Pulse 86 04/05/22 1310  Resp    SpO2 98 % 04/05/22 1310  Vitals shown include unvalidated device data.  Last Pain:  Vitals:   04/05/22 0911  TempSrc: Oral  PainSc: 3       Patients Stated Pain Goal: 7 (35/57/32 2025)  Complications: No notable events documented.

## 2022-04-05 NOTE — Progress Notes (Signed)
Assisted Dr. Rob Fitzgerald with right, interscalene , ultrasound guided block. Side rails up, monitors on throughout procedure. See vital signs in flow sheet. Tolerated Procedure well. 

## 2022-04-05 NOTE — Anesthesia Postprocedure Evaluation (Signed)
Anesthesia Post Note  Patient: Riley Larson  Procedure(s) Performed: SHOULDER ARTHROSCOPY WITH SUBACROMIAL DECOMPRESSION AND DEBRIDEMENT, OPEN DISTAL CLAVICLE EXCISION, OPEN ROTATOR CUFF REPAIR, ACROMIOPLASTY (Right: Shoulder)     Patient location during evaluation: PACU Anesthesia Type: General Level of consciousness: awake and alert Pain management: pain level controlled Vital Signs Assessment: post-procedure vital signs reviewed and stable Respiratory status: spontaneous breathing, nonlabored ventilation, respiratory function stable and patient connected to nasal cannula oxygen Cardiovascular status: blood pressure returned to baseline and stable Postop Assessment: no apparent nausea or vomiting Anesthetic complications: no  No notable events documented.  Last Vitals:  Vitals:   04/05/22 1330 04/05/22 1400  BP: (!) 147/91 (!) 156/91  Pulse: 74 94  Resp: 14 16  Temp:  36.8 C  SpO2: 95% 95%    Last Pain:  Vitals:   04/05/22 1400  TempSrc:   PainSc: 0-No pain                 Tiajuana Amass

## 2022-04-30 ENCOUNTER — Other Ambulatory Visit: Payer: Self-pay

## 2022-04-30 ENCOUNTER — Ambulatory Visit (HOSPITAL_COMMUNITY): Payer: BC Managed Care – PPO | Attending: Orthopedic Surgery | Admitting: Occupational Therapy

## 2022-04-30 ENCOUNTER — Encounter (HOSPITAL_COMMUNITY): Payer: Self-pay | Admitting: Occupational Therapy

## 2022-04-30 DIAGNOSIS — M25511 Pain in right shoulder: Secondary | ICD-10-CM | POA: Insufficient documentation

## 2022-04-30 DIAGNOSIS — M25611 Stiffness of right shoulder, not elsewhere classified: Secondary | ICD-10-CM | POA: Diagnosis not present

## 2022-04-30 DIAGNOSIS — R29898 Other symptoms and signs involving the musculoskeletal system: Secondary | ICD-10-CM | POA: Insufficient documentation

## 2022-04-30 NOTE — Therapy (Signed)
OUTPATIENT OCCUPATIONAL THERAPY ORTHO EVALUATION  Patient Name: Riley Larson MRN: [id number] DOB:Aug 01, 1970, 52 y.o., male Today's Date: 04/30/2022  PCP: Sharilyn Sites, MD REFERRING PROVIDER: Earlie Server, MD  END OF SESSION:  OT End of Session - 04/30/22 1549     Visit Number 1    Number of Visits 17    Date for OT Re-Evaluation 07/05/22    Authorization Type BC[employer]    Progress Note Due on Visit 10    OT Start Time 0950    OT Stop Time 1035    OT Time Calculation (min) 45 min    Activity Tolerance Patient tolerated treatment well    Behavior During Therapy [facility] for tasks assessed/performed             Past Medical History:  Diagnosis Date   Arthritis    Hypertension    Past Surgical History:  Procedure Laterality Date   BACK SURGERY     COLONOSCOPY WITH PROPOFOL N/A 03/14/2020   Procedure: COLONOSCOPY WITH PROPOFOL;  Surgeon: Harvel Quale, MD;  Location: [facility];  Service: Gastroenterology;  Laterality: N/A;  9:15   LAPAROSCOPIC GASTRIC BANDING     left index finger surgery      TOTAL KNEE ARTHROPLASTY Right 03/09/2021   Procedure: TOTAL KNEE ARTHROPLASTY;  Surgeon: Earlie Server, MD;  Location: [facility];  Service: Orthopedics;  Laterality: Right;   TOTAL KNEE ARTHROPLASTY Left 06/01/2021   Procedure: TOTAL KNEE ARTHROPLASTY;  Surgeon: Earlie Server, MD;  Location: [facility];  Service: Orthopedics;  Laterality: Left;   Patient Active Problem List   Diagnosis Date Noted   CELLULITIS, LEFT HAND 10/16/2009    ONSET DATE: 04/05/22  REFERRING DIAG: R shoulder Arthoplasty and rortator cuff repair  THERAPY DIAG:  Acute pain of right shoulder  Shoulder stiffness, right  Other symptoms and signs involving the musculoskeletal system  Rationale for Evaluation and Treatment: Rehabilitation  SUBJECTIVE:   SUBJECTIVE STATEMENT: "I'm tired of this sling Pt accompanied by: self  PERTINENT HISTORY: In 2023 pt was having a lot of pain with  movements in his R shoulder. He tore his rotator cuff pulling himself up off the ground. Pt had 3 injections prior to deciding on surgery.   PRECAUTIONS: Shoulder  WEIGHT BEARING RESTRICTIONS: Yes No weight at this time  PAIN:  Are you having pain? Yes: NPRS scale: 6/10 Pain location: trapezius and anterior shoulder girdle Pain description: tight, aching Aggravating factors: movment Relieving factors: supported pressure relief, medication  FALLS: Has patient fallen in last 6 months? No  LIVING ENVIRONMENT: Lives with: lives with their spouse Lives in: House/apartment Stairs: No  PLOF: Independent  PATIENT GOALS: To get back to normal  NEXT MD VISIT: 05/14/22  OBJECTIVE:   HAND DOMINANCE: Right  ADLs: Overall ADLs: Pt requiring increased time and up to min assist for dressing, bathing and grooming. All IADL's, pt has his wife assist/complete for him at this time.   FUNCTIONAL OUTCOME MEASURES: FOTO: 28.54  UPPER EXTREMITY ROM:     Passive ROM Right Eval Supine  Shoulder flexion 86  Shoulder abduction 101  Shoulder internal rotation 90  Shoulder external rotation 12  (Blank rows = not tested)  UPPER EXTREMITY MMT:     MMT Right eval  Shoulder flexion   Shoulder abduction   Shoulder adduction   Shoulder extension   Shoulder internal rotation   Shoulder external rotation   Elbow flexion   Elbow extension   (Blank rows = not tested)  HAND FUNCTION: Grip strength: Right: -- lbs; Left: -- lbs  SENSATION: WFL  EDEMA: No swelling noted  OBSERVATIONS: Severe fascial restriction noted in the trapezius, biceps, anterior shoulder girdle, scapula, and subscapularis   TODAY'S TREATMENT:                                                                                                                              DATE: 04/30/22: Evaluation Only    PATIENT EDUCATION: Education details: Table Slides Person educated: Patient Education method: Explanation,  Demonstration, and Handouts Education comprehension: verbalized understanding and returned demonstration  HOME EXERCISE PROGRAM: 1/23: Table Slides  GOALS: Goals reviewed with patient? Yes  SHORT TERM GOALS: Target date: 05/31/22  Pt will decrease fascial restrictions to minimal amounts or less in the RUE, in order to improve mobility required for overhead reaching tasks.  Goal status: INITIAL  2.  Pt will improve P/ROM to [facility] in RUE to improve ability to perform dressing tasks with minimal compensatory techniques.  Goal status: INITIAL  3.  Pt will improve strength to 4/5 in RUE to improve ability to assist LUE with lifting objects needed during dressing, bathing, and grooming tasks. Goal status: INITIAL   LONG TERM GOALS: Target date: 07/05/22  Pt will be provided and educated on HEP to improve mobility in RUE required for ADL completion.  Goal status: INITIAL  2.  Pt will decrease pain in RUE to 3/10 or less in order to sleep for 3+ consecutive hours without waking due to pain.  Goal status: INITIAL  3.  Pt will increase A/ROM of RUE to [facility] to improve ability to reach overhead and behind back during dressing and bathing tasks.  Goal status: INITIAL  4.  Pt will increase strength in RUE to 5/5 to improve ability to perform lifting tasks during cooking and cleaning, with no compensatory techniques.  Goal status: INITIAL   ASSESSMENT:  CLINICAL IMPRESSION: Patient is a 52 y.o. male who was seen today for occupational therapy evaluation for s/p R total shoulder arthroplasty and rotator cuff repair. Pt reported being limited by pain for months before surgery and having to push through all ADL's and IADL's. At this time, pt is very limited in ROM, strength, and activity tolerance, which is limiting his ability to complete ADL's independently.   PERFORMANCE DEFICITS: in functional skills including ADLs, IADLs, ROM, strength, pain, fascial restrictions, Gross motor control, body  mechanics, and UE functional use.  IMPAIRMENTS: are limiting patient from ADLs, IADLs, rest and sleep, work, leisure, and social participation.   COMORBIDITIES: has no other co-morbidities that affects occupational performance. Patient will benefit from skilled OT to address above impairments and improve overall function.  MODIFICATION OR ASSISTANCE TO COMPLETE EVALUATION: No modification of tasks or assist necessary to complete an evaluation.  OT OCCUPATIONAL PROFILE AND HISTORY: Problem focused assessment: Including review of records relating to presenting problem.  CLINICAL DECISION MAKING: LOW - limited treatment options, no task  modification necessary  REHAB POTENTIAL: Excellent  EVALUATION COMPLEXITY: Low      PLAN:  OT FREQUENCY: 2x/week  OT DURATION: 8 weeks  PLANNED INTERVENTIONS: self care/ADL training, therapeutic exercise, therapeutic activity, manual therapy, scar mobilization, passive range of motion, functional mobility training, electrical stimulation, ultrasound, moist heat, cryotherapy, patient/family education, coping strategies training, and DME and/or AE instructions  RECOMMENDED OTHER SERVICES: N/A  CONSULTED AND AGREED WITH PLAN OF CARE: Patient  PLAN FOR NEXT SESSION: Manual Therapy, P/ROM, pulleys, thumb tacs, table slides   Paulita Fujita, OTR/L [facility] [place], [place] 04/30/2022, 3:52 PM

## 2022-04-30 NOTE — Patient Instructions (Signed)

## 2022-05-03 ENCOUNTER — Encounter (HOSPITAL_COMMUNITY): Payer: Self-pay | Admitting: Occupational Therapy

## 2022-05-03 ENCOUNTER — Ambulatory Visit (HOSPITAL_COMMUNITY): Payer: BC Managed Care – PPO | Admitting: Occupational Therapy

## 2022-05-03 DIAGNOSIS — M25611 Stiffness of right shoulder, not elsewhere classified: Secondary | ICD-10-CM | POA: Diagnosis not present

## 2022-05-03 DIAGNOSIS — R29898 Other symptoms and signs involving the musculoskeletal system: Secondary | ICD-10-CM

## 2022-05-03 DIAGNOSIS — M25511 Pain in right shoulder: Secondary | ICD-10-CM

## 2022-05-03 NOTE — Therapy (Signed)
OUTPATIENT OCCUPATIONAL THERAPY ORTHO TREATMENT NOTE  Patient Name: Riley Larson MRN: [id number] DOB:1971-03-10, 52 y.o., male Today's Date: 05/03/2022  PCP: Sharilyn Sites, MD REFERRING PROVIDER: Earlie Server, MD  END OF SESSION:  OT End of Session - 05/03/22 0950     Visit Number 2    Number of Visits 17    Date for OT Re-Evaluation 07/05/22    Authorization Type BCBS    Progress Note Due on Visit 10    OT Start Time 0945    OT Stop Time 1030    OT Time Calculation (min) 45 min    Activity Tolerance Patient tolerated treatment well    Behavior During Therapy [facility] for tasks assessed/performed             Past Medical History:  Diagnosis Date   Arthritis    Hypertension    Past Surgical History:  Procedure Laterality Date   BACK SURGERY     COLONOSCOPY WITH PROPOFOL N/A 03/14/2020   Procedure: COLONOSCOPY WITH PROPOFOL;  Surgeon: Harvel Quale, MD;  [facility];  Service: Gastroenterology;  Laterality: N/A;  9:15   LAPAROSCOPIC GASTRIC BANDING     left index finger surgery      TOTAL KNEE ARTHROPLASTY Right 03/09/2021   Procedure: TOTAL KNEE ARTHROPLASTY;  Surgeon: Earlie Server, MD;  Location: [facility];  Service: Orthopedics;  Laterality: Right;   TOTAL KNEE ARTHROPLASTY Left 06/01/2021   Procedure: TOTAL KNEE ARTHROPLASTY;  Surgeon: Earlie Server, MD;  Location: [facility];  Service: Orthopedics;  Laterality: Left;   Patient Active Problem List   Diagnosis Date Noted   CELLULITIS, LEFT HAND 10/16/2009    ONSET DATE: 04/05/22  REFERRING DIAG: R shoulder Arthoplasty and rortator cuff repair  THERAPY DIAG:  Acute pain of right shoulder  Shoulder stiffness, right  Other symptoms and signs involving the musculoskeletal system  Rationale for Evaluation and Treatment: Rehabilitation  SUBJECTIVE:   SUBJECTIVE STATEMENT: "I've been a little sore and achy but nothing as bad as my knees." Pt accompanied by: self  PERTINENT HISTORY:  In 2023 pt was having a lot of pain with movements in his R shoulder. He tore his rotator cuff pulling himself up off the ground. Pt had 3 injections prior to deciding on surgery.   PRECAUTIONS: Shoulder  WEIGHT BEARING RESTRICTIONS: Yes No weight at this time  PAIN:  Are you having pain? Yes: NPRS scale: 5/10 Pain location: trapezius and anterior shoulder girdle Pain description: tight, aching Aggravating factors: movment Relieving factors: supported pressure relief, medication  FALLS: Has patient fallen in last 6 months? No  LIVING ENVIRONMENT: Lives with: lives with their spouse Lives in: House/apartment Stairs: No  PLOF: Independent  PATIENT GOALS: To get back to normal  NEXT MD VISIT: 05/14/22  OBJECTIVE:   HAND DOMINANCE: Right  ADLs: Overall ADLs: Pt requiring increased time and up to min assist for dressing, bathing and grooming. All IADL's, pt has his wife assist/complete for him at this time.   FUNCTIONAL OUTCOME MEASURES: FOTO: 28.54  UPPER EXTREMITY ROM:     Passive ROM Right Eval Supine  Shoulder flexion 86  Shoulder abduction 101  Shoulder internal rotation 90  Shoulder external rotation 12  (Blank rows = not tested)  UPPER EXTREMITY MMT:     MMT Right eval  Shoulder flexion   Shoulder abduction   Shoulder adduction   Shoulder extension   Shoulder internal rotation   Shoulder external rotation   Elbow flexion  Elbow extension   (Blank rows = not tested)  HAND FUNCTION: Grip strength: Right: -- lbs; Left: -- lbs  SENSATION: WFL  EDEMA: No swelling noted  OBSERVATIONS: Severe fascial restriction noted in the trapezius, biceps, anterior shoulder girdle, scapula, and subscapularis   TODAY'S TREATMENT:                                                                                                                              DATE:   05/03/22 -Manual Therapy: myofascial release and trigger point applied to the RUE at the biceps,  subscapularis, axillary region, and trapezius to address pain and fascial restrictions in order to improve ROM. -P/ROM: flexion, abduction, horizontal abduction, er/IR, x10 -Ball Rolls: flexion and abduction, x10 each -Thumbtacs 2x45" -Pulleys: flexion and abduction, x10    PATIENT EDUCATION: Education details: Chief Operating Officer Person educated: Patient Education method: Explanation, Demonstration, and Handouts Education comprehension: verbalized understanding and returned demonstration  HOME EXERCISE PROGRAM: 1/23: Table Slides 1/26: Thumb tacs  GOALS: Goals reviewed with patient? Yes  SHORT TERM GOALS: Target date: 05/31/22  Pt will decrease fascial restrictions to minimal amounts or less in the RUE, in order to improve mobility required for overhead reaching tasks.  Goal status: IN PROGRESS  2.  Pt will improve P/ROM to [facility] in RUE to improve ability to perform dressing tasks with minimal compensatory techniques.  Goal status: IN PROGRESS  3.  Pt will improve strength to 4/5 in RUE to improve ability to assist LUE with lifting objects needed during dressing, bathing, and grooming tasks. Goal status: IN PROGRESS   LONG TERM GOALS: Target date: 07/05/22  Pt will be provided and educated on HEP to improve mobility in RUE required for ADL completion.  Goal status: IN PROGRESS  2.  Pt will decrease pain in RUE to 3/10 or less in order to sleep for 3+ consecutive hours without waking due to pain.  Goal status: IN PROGRESS  3.  Pt will increase A/ROM of RUE to [facility] to improve ability to reach overhead and behind back during dressing and bathing tasks.  Goal status: IN PROGRESS  4.  Pt will increase strength in RUE to 5/5 to improve ability to perform lifting tasks during cooking and cleaning, with no compensatory techniques.  Goal status: IN PROGRESS   ASSESSMENT:  CLINICAL IMPRESSION: Pt seen for his first therapy session with limited pain. Following his s/p total shoulder  arthroplasty protocol, session focused on passive ROM and promoting good movement pattern. He was able to tolerate approximately 60% of full ROM passively, as well as during ball rolls and pulley exercises. Pt encouraged to use heat and elevated his shoulder at home, and he reports good follow through with daily HEP. OT providing verbal cuing for positioning and breathing techniques for relaxation.   PERFORMANCE DEFICITS: in functional skills including ADLs, IADLs, ROM, strength, pain, fascial restrictions, Gross motor control, body mechanics, and UE functional use.   PLAN:  OT FREQUENCY: 2x/week  OT DURATION: 8 weeks  PLANNED INTERVENTIONS: self care/ADL training, therapeutic exercise, therapeutic activity, manual therapy, scar mobilization, passive range of motion, functional mobility training, electrical stimulation, ultrasound, moist heat, cryotherapy, patient/family education, coping strategies training, and DME and/or AE instructions  RECOMMENDED OTHER SERVICES: N/A  CONSULTED AND AGREED WITH PLAN OF CARE: Patient  PLAN FOR NEXT SESSION: Manual Therapy, P/ROM, pulleys, thumb tacs, table slides, ball rolls   Paulita Fujita, OTR/L [place], [place] 05/03/2022, 9:51 AM

## 2022-05-10 ENCOUNTER — Encounter (HOSPITAL_COMMUNITY): Payer: BC Managed Care – PPO | Admitting: Occupational Therapy

## 2022-05-15 ENCOUNTER — Encounter (HOSPITAL_COMMUNITY): Payer: Self-pay | Admitting: Occupational Therapy

## 2022-05-15 ENCOUNTER — Ambulatory Visit (HOSPITAL_COMMUNITY): Payer: BC Managed Care – PPO | Attending: Orthopedic Surgery | Admitting: Occupational Therapy

## 2022-05-15 DIAGNOSIS — M25511 Pain in right shoulder: Secondary | ICD-10-CM | POA: Diagnosis not present

## 2022-05-15 DIAGNOSIS — M25611 Stiffness of right shoulder, not elsewhere classified: Secondary | ICD-10-CM | POA: Insufficient documentation

## 2022-05-15 DIAGNOSIS — R29898 Other symptoms and signs involving the musculoskeletal system: Secondary | ICD-10-CM | POA: Diagnosis not present

## 2022-05-15 NOTE — Therapy (Signed)
OUTPATIENT OCCUPATIONAL THERAPY ORTHO TREATMENT NOTE  Patient Name: MEREL SANTOLI MRN: [id number] DOB:[birth year], 52 y.o., male Today's Date: 05/15/2022  PCP: Sharilyn Sites, MD REFERRING PROVIDER: Earlie Server, MD  END OF SESSION:  OT End of Session - 05/15/22 0901     Visit Number 3    Number of Visits 17    Date for OT Re-Evaluation 07/05/22    Authorization Type BCBS    Progress Note Due on Visit 10    OT Start Time 0900    OT Stop Time 0940    OT Time Calculation (min) 40 min    Activity Tolerance Patient tolerated treatment well    Behavior During Therapy [facility] for tasks assessed/performed             Past Medical History:  Diagnosis Date   Arthritis    Hypertension    Past Surgical History:  Procedure Laterality Date   BACK SURGERY     COLONOSCOPY WITH PROPOFOL N/A 03/14/2020   Procedure: COLONOSCOPY WITH PROPOFOL;  Surgeon: Harvel Quale, MD;  [facility];  Service: Gastroenterology;  Laterality: N/A;  9:15   LAPAROSCOPIC GASTRIC BANDING     left index finger surgery      TOTAL KNEE ARTHROPLASTY Right 03/09/2021   Procedure: TOTAL KNEE ARTHROPLASTY;  Surgeon: Earlie Server, MD;  Location: [facility];  Service: Orthopedics;  Laterality: Right;   TOTAL KNEE ARTHROPLASTY Left 06/01/2021   Procedure: TOTAL KNEE ARTHROPLASTY;  Surgeon: Earlie Server, MD;  Location: [facility];  Service: Orthopedics;  Laterality: Left;   Patient Active Problem List   Diagnosis Date Noted   CELLULITIS, LEFT HAND 10/16/2009    ONSET DATE: 04/05/22  REFERRING DIAG: R shoulder Arthoplasty and rortator cuff repair  THERAPY DIAG:  Acute pain of right shoulder  Shoulder stiffness, right  Other symptoms and signs involving the musculoskeletal system  Rationale for Evaluation and Treatment: Rehabilitation  SUBJECTIVE:   SUBJECTIVE STATEMENT: "The doc took my sling yesterday." Pt accompanied by: self  PERTINENT HISTORY: In 2023 pt was having a lot of  pain with movements in his R shoulder. He tore his rotator cuff pulling himself up off the ground. Pt had 3 injections prior to deciding on surgery.   PRECAUTIONS: Shoulder  WEIGHT BEARING RESTRICTIONS: Yes No weight at this time  PAIN:  Are you having pain? Yes: NPRS scale: 2/10 Pain location: trapezius and anterior shoulder girdle Pain description: tight, aching Aggravating factors: movment Relieving factors: supported pressure relief, medication  FALLS: Has patient fallen in last 6 months? No  PATIENT GOALS: To get back to normal  NEXT MD VISIT: 06/13/22  OBJECTIVE:   HAND DOMINANCE: Right  ADLs: Overall ADLs: Pt requiring increased time and up to min assist for dressing, bathing and grooming. All IADL's, pt has his wife assist/complete for him at this time.   FUNCTIONAL OUTCOME MEASURES: FOTO: 28.54  UPPER EXTREMITY ROM:     Passive ROM Right Eval Supine  Shoulder flexion 86  Shoulder abduction 101  Shoulder internal rotation 90  Shoulder external rotation 12  (Blank rows = not tested)  UPPER EXTREMITY MMT:     MMT Right eval  Shoulder flexion   Shoulder abduction   Shoulder adduction   Shoulder extension   Shoulder internal rotation   Shoulder external rotation   Elbow flexion   Elbow extension   (Blank rows = not tested)  HAND FUNCTION: Grip strength: Right: -- lbs; Left: -- lbs  SENSATION: WFL  EDEMA:  No swelling noted  OBSERVATIONS: Severe fascial restriction noted in the trapezius, biceps, anterior shoulder girdle, scapula, and subscapularis   TODAY'S TREATMENT:                                                                                                                              DATE:   05/14/22 -Manual Therapy: myofascial release and trigger point applied to the RUE at the biceps, subscapularis, axillary region, and trapezius to address pain and fascial restrictions in order to improve ROM. -P/ROM: flexion, abduction, horizontal  abduction, er/IR, x10 -AA/ROM: supine, protraction, flexion, abduction  05/03/22 -Manual Therapy: myofascial release and trigger point applied to the RUE at the biceps, subscapularis, axillary region, and trapezius to address pain and fascial restrictions in order to improve ROM. -P/ROM: flexion, abduction, horizontal abduction, er/IR, x10 -Ball Rolls: flexion and abduction, x10 each -Thumbtacs 2x45" -Pulleys: flexion and abduction, x10    PATIENT EDUCATION: Education details: AA/ROM Person educated: Patient Education method: Consulting civil engineer, Media planner, and Handouts Education comprehension: verbalized understanding and returned demonstration  HOME EXERCISE PROGRAM: 1/23: Table Slides 1/26: Thumb tacs 2/7: AA/ROM  GOALS: Goals reviewed with patient? Yes  SHORT TERM GOALS: Target date: 05/31/22  Pt will decrease fascial restrictions to minimal amounts or less in the RUE, in order to improve mobility required for overhead reaching tasks.  Goal status: IN PROGRESS  2.  Pt will improve P/ROM to [facility] in RUE to improve ability to perform dressing tasks with minimal compensatory techniques.  Goal status: IN PROGRESS  3.  Pt will improve strength to 4/5 in RUE to improve ability to assist LUE with lifting objects needed during dressing, bathing, and grooming tasks. Goal status: IN PROGRESS   LONG TERM GOALS: Target date: 07/05/22  Pt will be provided and educated on HEP to improve mobility in RUE required for ADL completion.  Goal status: IN PROGRESS  2.  Pt will decrease pain in RUE to 3/10 or less in order to sleep for 3+ consecutive hours without waking due to pain.  Goal status: IN PROGRESS  3.  Pt will increase A/ROM of RUE to [facility] to improve ability to reach overhead and behind back during dressing and bathing tasks.  Goal status: IN PROGRESS  4.  Pt will increase strength in RUE to 5/5 to improve ability to perform lifting tasks during cooking and cleaning, with no  compensatory techniques.  Goal status: IN PROGRESS   ASSESSMENT:  CLINICAL IMPRESSION: Pt started on AA/ROM this session due to being 6 weeks out from his surgery. He is having difficulty initiating movement, especially in flexion. His elbow is remaining mildly flexed throughout tasks. AA/ROM requiring increased time for pt due to pain and difficulty with movement, as well as muscle fatigue requiring him to take multiple breaks. OT providing verbal cuing for breathing techniques to relax, as well as to encourage proper form and positioning.   PERFORMANCE DEFICITS: in functional skills including ADLs, IADLs, ROM,  strength, pain, fascial restrictions, Gross motor control, body mechanics, and UE functional use.   PLAN:  OT FREQUENCY: 2x/week  OT DURATION: 8 weeks  PLANNED INTERVENTIONS: self care/ADL training, therapeutic exercise, therapeutic activity, manual therapy, scar mobilization, passive range of motion, functional mobility training, electrical stimulation, ultrasound, moist heat, cryotherapy, patient/family education, coping strategies training, and DME and/or AE instructions  RECOMMENDED OTHER SERVICES: N/A  CONSULTED AND AGREED WITH PLAN OF CARE: Patient  PLAN FOR NEXT SESSION: Manual Therapy, P/ROM, pulleys, thumb tacs, table slides, ball rolls, AA/ROM   Paulita Fujita, OTR/L [facility] [place], [place] 05/15/2022, 9:02 AM

## 2022-05-15 NOTE — Patient Instructions (Signed)

## 2022-05-17 ENCOUNTER — Encounter (HOSPITAL_COMMUNITY): Payer: Self-pay | Admitting: Occupational Therapy

## 2022-05-17 ENCOUNTER — Ambulatory Visit (HOSPITAL_COMMUNITY): Payer: BC Managed Care – PPO | Admitting: Occupational Therapy

## 2022-05-17 DIAGNOSIS — M25511 Pain in right shoulder: Secondary | ICD-10-CM

## 2022-05-17 DIAGNOSIS — R29898 Other symptoms and signs involving the musculoskeletal system: Secondary | ICD-10-CM

## 2022-05-17 DIAGNOSIS — M25611 Stiffness of right shoulder, not elsewhere classified: Secondary | ICD-10-CM

## 2022-05-17 NOTE — Therapy (Unsigned)
OUTPATIENT OCCUPATIONAL THERAPY ORTHO TREATMENT NOTE  Patient Name: Riley Larson MRN: [id number] DOB:02-16-1971, 52 y.o., male Today's Date: 05/17/2022  PCP: Sharilyn Sites, MD REFERRING PROVIDER: Earlie Server, MD  END OF SESSION:  OT End of Session - 05/17/22 0735     Visit Number 4    Number of Visits 17    Date for OT Re-Evaluation 07/05/22    Authorization Type BCBS    Progress Note Due on Visit 10    OT Start Time 0735    OT Stop Time 0815    OT Time Calculation (min) 40 min    Activity Tolerance Patient tolerated treatment well    Behavior During Therapy [facility] for tasks assessed/performed             Past Medical History:  Diagnosis Date   Arthritis    Hypertension    Past Surgical History:  Procedure Laterality Date   BACK SURGERY     COLONOSCOPY WITH PROPOFOL N/A 03/14/2020   Procedure: COLONOSCOPY WITH PROPOFOL;  Surgeon: Harvel Quale, MD;  [facility];  Service: Gastroenterology;  Laterality: N/A;  9:15   LAPAROSCOPIC GASTRIC BANDING     left index finger surgery      TOTAL KNEE ARTHROPLASTY Right 03/09/2021   Procedure: TOTAL KNEE ARTHROPLASTY;  Surgeon: Earlie Server, MD;  Location: [facility];  Service: Orthopedics;  Laterality: Right;   TOTAL KNEE ARTHROPLASTY Left 06/01/2021   Procedure: TOTAL KNEE ARTHROPLASTY;  Surgeon: Earlie Server, MD;  Location: [facility];  Service: Orthopedics;  Laterality: Left;   Patient Active Problem List   Diagnosis Date Noted   CELLULITIS, LEFT HAND 10/16/2009    ONSET DATE: 04/05/22  REFERRING DIAG: R shoulder Arthoplasty and rortator cuff repair  THERAPY DIAG:  Acute pain of right shoulder  Shoulder stiffness, right  Other symptoms and signs involving the musculoskeletal system  Rationale for Evaluation and Treatment: Rehabilitation  SUBJECTIVE:   SUBJECTIVE STATEMENT: "I want to be able to reach out on my own." Pt accompanied by: self  PERTINENT HISTORY: In 2023 pt was having a  lot of pain with movements in his R shoulder. He tore his rotator cuff pulling himself up off the ground. Pt had 3 injections prior to deciding on surgery.   PRECAUTIONS: Shoulder  WEIGHT BEARING RESTRICTIONS: Yes No weight at this time  PAIN:  Are you having pain? Yes: NPRS scale: 2/10 Pain location: trapezius and anterior shoulder girdle Pain description: tight, aching Aggravating factors: movment Relieving factors: supported pressure relief, medication  FALLS: Has patient fallen in last 6 months? No  PATIENT GOALS: To get back to normal  NEXT MD VISIT: 06/13/22  OBJECTIVE:   HAND DOMINANCE: Right  ADLs: Overall ADLs: Pt requiring increased time and up to min assist for dressing, bathing and grooming. All IADL's, pt has his wife assist/complete for him at this time.   FUNCTIONAL OUTCOME MEASURES: FOTO: 28.54  UPPER EXTREMITY ROM:     Passive ROM Right Eval Supine  Shoulder flexion 86  Shoulder abduction 101  Shoulder internal rotation 90  Shoulder external rotation 12  (Blank rows = not tested)  UPPER EXTREMITY MMT:     MMT Right eval  Shoulder flexion   Shoulder abduction   Shoulder adduction   Shoulder extension   Shoulder internal rotation   Shoulder external rotation   Elbow flexion   Elbow extension   (Blank rows = not tested)  HAND FUNCTION: Grip strength: Right: -- lbs; Left: -- lbs  SENSATION: WFL  EDEMA: No swelling noted  OBSERVATIONS: Severe fascial restriction noted in the trapezius, biceps, anterior shoulder girdle, scapula, and subscapularis   TODAY'S TREATMENT:                                                                                                                              DATE:   05/17/22 -Manual Therapy: myofascial release and trigger point applied to the RUE at the biceps, subscapularis, axillary region, and trapezius to address pain and fascial restrictions in order to improve ROM. -P/ROM: flexion, abduction, horizontal  abduction, er/IR, x10 -AA/ROM: supine, protraction, flexion, abduction, horizontal abduction, er/IR, x10 -Pulleys: flexion and abduction, x60"  05/14/22 -Manual Therapy: myofascial release and trigger point applied to the RUE at the biceps, subscapularis, axillary region, and trapezius to address pain and fascial restrictions in order to improve ROM. -P/ROM: flexion, abduction, horizontal abduction, er/IR, x10 -AA/ROM: supine, protraction, flexion, abduction  05/03/22 -Manual Therapy: myofascial release and trigger point applied to the RUE at the biceps, subscapularis, axillary region, and trapezius to address pain and fascial restrictions in order to improve ROM. -P/ROM: flexion, abduction, horizontal abduction, er/IR, x10 -Ball Rolls: flexion and abduction, x10 each -Thumbtacs 2x45" -Pulleys: flexion and abduction, x10    PATIENT EDUCATION: Education details: Reviewed HEP Person educated: Patient Education method: Explanation, Demonstration, and Handouts Education comprehension: verbalized understanding and returned demonstration  HOME EXERCISE PROGRAM: 1/23: Table Slides 1/26: Thumb tacs 2/7: AA/ROM  GOALS: Goals reviewed with patient? Yes  SHORT TERM GOALS: Target date: 05/31/22  Pt will decrease fascial restrictions to minimal amounts or less in the RUE, in order to improve mobility required for overhead reaching tasks.  Goal status: IN PROGRESS  2.  Pt will improve P/ROM to [facility] in RUE to improve ability to perform dressing tasks with minimal compensatory techniques.  Goal status: IN PROGRESS  3.  Pt will improve strength to 4/5 in RUE to improve ability to assist LUE with lifting objects needed during dressing, bathing, and grooming tasks. Goal status: IN PROGRESS   LONG TERM GOALS: Target date: 07/05/22  Pt will be provided and educated on HEP to improve mobility in RUE required for ADL completion.  Goal status: IN PROGRESS  2.  Pt will decrease pain in RUE to 3/10  or less in order to sleep for 3+ consecutive hours without waking due to pain.  Goal status: IN PROGRESS  3.  Pt will increase A/ROM of RUE to [facility] to improve ability to reach overhead and behind back during dressing and bathing tasks.  Goal status: IN PROGRESS  4.  Pt will increase strength in RUE to 5/5 to improve ability to perform lifting tasks during cooking and cleaning, with no compensatory techniques.  Goal status: IN PROGRESS   ASSESSMENT:  CLINICAL IMPRESSION: This session pt continued working on his ROM, passively, assisted, and with pulleys. He continues to have increased stiffness and fascial restrictions limiting his full ROM and  causing mild pain when reaching end ranges. OT providing verbal and tactile cuing for positioning and technique throughout, as well as increased time during P/ROM for increased holds to stretch and encourage muscle relaxation.   PERFORMANCE DEFICITS: in functional skills including ADLs, IADLs, ROM, strength, pain, fascial restrictions, Gross motor control, body mechanics, and UE functional use.   PLAN:  OT FREQUENCY: 2x/week  OT DURATION: 8 weeks  PLANNED INTERVENTIONS: self care/ADL training, therapeutic exercise, therapeutic activity, manual therapy, scar mobilization, passive range of motion, functional mobility training, electrical stimulation, ultrasound, moist heat, cryotherapy, patient/family education, coping strategies training, and DME and/or AE instructions  RECOMMENDED OTHER SERVICES: N/A  CONSULTED AND AGREED WITH PLAN OF CARE: Patient  PLAN FOR NEXT SESSION: Manual Therapy, P/ROM, pulleys, thumb tacs, table slides, ball rolls, AA/ROM   Paulita Fujita, OTR/L [place], [place] 05/17/2022, 7:37 AM

## 2022-05-21 ENCOUNTER — Encounter (HOSPITAL_COMMUNITY): Payer: BC Managed Care – PPO | Admitting: Occupational Therapy

## 2022-05-24 ENCOUNTER — Encounter (HOSPITAL_COMMUNITY): Payer: BC Managed Care – PPO | Admitting: Occupational Therapy

## 2022-05-28 ENCOUNTER — Encounter (HOSPITAL_COMMUNITY): Payer: Self-pay | Admitting: Occupational Therapy

## 2022-05-28 ENCOUNTER — Ambulatory Visit (HOSPITAL_COMMUNITY): Payer: BC Managed Care – PPO | Admitting: Occupational Therapy

## 2022-05-28 DIAGNOSIS — M25511 Pain in right shoulder: Secondary | ICD-10-CM | POA: Diagnosis not present

## 2022-05-28 DIAGNOSIS — R29898 Other symptoms and signs involving the musculoskeletal system: Secondary | ICD-10-CM | POA: Diagnosis not present

## 2022-05-28 DIAGNOSIS — M25611 Stiffness of right shoulder, not elsewhere classified: Secondary | ICD-10-CM

## 2022-05-28 NOTE — Therapy (Signed)
OUTPATIENT OCCUPATIONAL THERAPY ORTHO TREATMENT NOTE  Patient Name: Riley Larson MRN: [id number] DOB:08-29-1970, 52 y.o., male Today's Date: 05/28/2022  PCP: Sharilyn Sites, MD REFERRING PROVIDER: Earlie Server, MD  END OF SESSION:  OT End of Session - 05/28/22 0736     Visit Number 5    Number of Visits 17    Date for OT Re-Evaluation 07/05/22    Authorization Type BCBS    Progress Note Due on Visit 10    OT Start Time [phone]    OT Stop Time 0815    OT Time Calculation (min) 39 min    Activity Tolerance Patient tolerated treatment well    Behavior During Therapy [facility] for tasks assessed/performed             Past Medical History:  Diagnosis Date   Arthritis    Hypertension    Past Surgical History:  Procedure Laterality Date   BACK SURGERY     COLONOSCOPY WITH PROPOFOL N/A 03/14/2020   Procedure: COLONOSCOPY WITH PROPOFOL;  Surgeon: Harvel Quale, MD;  [facility]: [facility];  Service: Gastroenterology;  Laterality: N/A;  9:15   LAPAROSCOPIC GASTRIC BANDING     left index finger surgery      TOTAL KNEE ARTHROPLASTY Right 03/09/2021   Procedure: TOTAL KNEE ARTHROPLASTY;  Surgeon: Earlie Server, MD;  Location: [facility];  Service: Orthopedics;  Laterality: Right;   TOTAL KNEE ARTHROPLASTY Left 06/01/2021   Procedure: TOTAL KNEE ARTHROPLASTY;  Surgeon: Earlie Server, MD;  Location: [facility];  Service: Orthopedics;  Laterality: Left;   Patient Active Problem List   Diagnosis Date Noted   CELLULITIS, LEFT HAND 10/16/2009    ONSET DATE: 04/05/22  REFERRING DIAG: R shoulder Arthoplasty and rortator cuff repair  THERAPY DIAG:  Acute pain of right shoulder  Shoulder stiffness, right  Other symptoms and signs involving the musculoskeletal system  Rationale for Evaluation and Treatment: Rehabilitation  SUBJECTIVE:   SUBJECTIVE STATEMENT: "I still can't reach my hand up to wash my hair." Pt accompanied by: self  PERTINENT HISTORY: In 2023 pt was  having a lot of pain with movements in his R shoulder. He tore his rotator cuff pulling himself up off the ground. Pt had 3 injections prior to deciding on surgery.   PRECAUTIONS: Shoulder  WEIGHT BEARING RESTRICTIONS: Yes No weight at this time  PAIN:  Are you having pain? Yes: NPRS scale: 6/10 Pain location: trapezius and anterior shoulder girdle Pain description: tight, aching Aggravating factors: movment Relieving factors: supported pressure relief, medication  FALLS: Has patient fallen in last 6 months? No  PATIENT GOALS: To get back to normal  NEXT MD VISIT: 06/13/22  OBJECTIVE:   HAND DOMINANCE: Right  ADLs: Overall ADLs: Pt requiring increased time and up to min assist for dressing, bathing and grooming. All IADL's, pt has his wife assist/complete for him at this time.   FUNCTIONAL OUTCOME MEASURES: FOTO: 28.54  UPPER EXTREMITY ROM:     Passive ROM Right Eval Supine  Shoulder flexion 86  Shoulder abduction 101  Shoulder internal rotation 90  Shoulder external rotation 12  (Blank rows = not tested)  UPPER EXTREMITY MMT:     MMT Right eval  Shoulder flexion   Shoulder abduction   Shoulder adduction   Shoulder extension   Shoulder internal rotation   Shoulder external rotation   Elbow flexion   Elbow extension   (Blank rows = not tested)  HAND FUNCTION: Grip strength: Right: -- lbs; Left: -- lbs  SENSATION: WFL  EDEMA: No swelling noted  OBSERVATIONS: Severe fascial restriction noted in the trapezius, biceps, anterior shoulder girdle, scapula, and subscapularis   TODAY'S TREATMENT:                                                                                                                              DATE:   05/28/22 -Manual Therapy: myofascial release and trigger point applied to the RUE at the biceps, subscapularis, axillary region, and trapezius to address pain and fascial restrictions in order to improve ROM. -AA/ROM: supine,  protraction, flexion, abduction, horizontal abduction, er/IR, x10 -Isometrics: flexion, extension, abduction, er, IR, 5x10" -Wall Slides: flexion and abduction, x10  05/17/22 -Manual Therapy: myofascial release and trigger point applied to the RUE at the biceps, subscapularis, axillary region, and trapezius to address pain and fascial restrictions in order to improve ROM. -P/ROM: flexion, abduction, horizontal abduction, er/IR, x10 -AA/ROM: supine, protraction, flexion, abduction, horizontal abduction, er/IR, x10 -Pulleys: flexion and abduction, x60"  05/14/22 -Manual Therapy: myofascial release and trigger point applied to the RUE at the biceps, subscapularis, axillary region, and trapezius to address pain and fascial restrictions in order to improve ROM. -P/ROM: flexion, abduction, horizontal abduction, er/IR, x10 -AA/ROM: supine, protraction, flexion, abduction   PATIENT EDUCATION: Education details: Isometrics and Wall Slides Person educated: Patient Education method: Explanation, Demonstration, and Handouts Education comprehension: verbalized understanding and returned demonstration  HOME EXERCISE PROGRAM: 1/23: Table Slides 1/26: Thumb tacs 2/7: AA/ROM 2/20: Isometrics and Wall Slides  GOALS: Goals reviewed with patient? Yes  SHORT TERM GOALS: Target date: 05/31/22  Pt will decrease fascial restrictions to minimal amounts or less in the RUE, in order to improve mobility required for overhead reaching tasks.  Goal status: IN PROGRESS  2.  Pt will improve P/ROM to [facility] in RUE to improve ability to perform dressing tasks with minimal compensatory techniques.  Goal status: IN PROGRESS  3.  Pt will improve strength to 4/5 in RUE to improve ability to assist LUE with lifting objects needed during dressing, bathing, and grooming tasks. Goal status: IN PROGRESS   LONG TERM GOALS: Target date: 07/05/22  Pt will be provided and educated on HEP to improve mobility in RUE required for  ADL completion.  Goal status: IN PROGRESS  2.  Pt will decrease pain in RUE to 3/10 or less in order to sleep for 3+ consecutive hours without waking due to pain.  Goal status: IN PROGRESS  3.  Pt will increase A/ROM of RUE to [facility] to improve ability to reach overhead and behind back during dressing and bathing tasks.  Goal status: IN PROGRESS  4.  Pt will increase strength in RUE to 5/5 to improve ability to perform lifting tasks during cooking and cleaning, with no compensatory techniques.  Goal status: IN PROGRESS   ASSESSMENT:  CLINICAL IMPRESSION: Pt reporting frustration this session over the increased pain and difficulty reaching his arm out and up. With AA/ROM, he  is having increased pain, especially with protraction and horizontal abduction. OT then added Isometrics and Wall Slides to work on stability of the shoulder joint and moving his arm overhead in standing. With the wall slides he reported that he felt like his shoulder was catching and it was increasingly difficult for him to slide his hand up, therefore he had to walk his fingers to achieve closer to full range against the wall. Verbal and tactile cuing provided for positioning and technique.   PERFORMANCE DEFICITS: in functional skills including ADLs, IADLs, ROM, strength, pain, fascial restrictions, Gross motor control, body mechanics, and UE functional use.   PLAN:  OT FREQUENCY: 2x/week  OT DURATION: 8 weeks  PLANNED INTERVENTIONS: self care/ADL training, therapeutic exercise, therapeutic activity, manual therapy, scar mobilization, passive range of motion, functional mobility training, electrical stimulation, ultrasound, moist heat, cryotherapy, patient/family education, coping strategies training, and DME and/or AE instructions  RECOMMENDED OTHER SERVICES: N/A  CONSULTED AND AGREED WITH PLAN OF CARE: Patient  PLAN FOR NEXT SESSION: Manual Therapy, P/ROM, pulleys, thumb tacs, table slides, ball rolls, AA/ROM,  Wall slides, isometrics   Paulita Fujita, OTR/L [facility] [place], [place] 05/28/2022, 7:37 AM

## 2022-05-28 NOTE — Patient Instructions (Signed)
  Complete the following 2 a day. Hold for 10-15 seconds. Complete 5-8 sets for each.   1) SHOULDER - ISOMETRIC FLEXION  Gently push your fist forward into a wall with your elbow bent.    2) SHOULDER - ISOMETRIC EXTENSION  Gently push your a bent elbow back into a wall.    3) SHOULDER - ISOMETRIC INTERNAL ROTATION   Gently press your hand into a wall using the palm side of your hand.  Maintain a bent elbow the entire time.        4) SHOULDER - ISOMETRIC ADDUCTION  Gently push your elbow into the side of your body.   5) SHOULDER - ISOMETRIC ABDUCTION  Gently push your elbow out to the side into a wall with your elbow bent.

## 2022-05-31 ENCOUNTER — Encounter (HOSPITAL_COMMUNITY): Payer: Self-pay | Admitting: Occupational Therapy

## 2022-05-31 ENCOUNTER — Ambulatory Visit (HOSPITAL_COMMUNITY): Payer: BC Managed Care – PPO | Admitting: Occupational Therapy

## 2022-05-31 DIAGNOSIS — R29898 Other symptoms and signs involving the musculoskeletal system: Secondary | ICD-10-CM | POA: Diagnosis not present

## 2022-05-31 DIAGNOSIS — M25511 Pain in right shoulder: Secondary | ICD-10-CM | POA: Diagnosis not present

## 2022-05-31 DIAGNOSIS — M25611 Stiffness of right shoulder, not elsewhere classified: Secondary | ICD-10-CM | POA: Diagnosis not present

## 2022-05-31 NOTE — Therapy (Signed)
OUTPATIENT OCCUPATIONAL THERAPY ORTHO TREATMENT NOTE  Patient Name: Riley Larson MRN: [id number] DOB:05/02/1970, 52 y.o., male Today's Date: 05/31/2022  PCP: Sharilyn Sites, MD REFERRING PROVIDER: Earlie Server, MD  END OF SESSION:  OT End of Session - 05/31/22 1149     Visit Number 6    Number of Visits 17    Date for OT Re-Evaluation 07/05/22    Authorization Type [employer]    Progress Note Due on Visit 10    OT Start Time 1035    OT Stop Time 1115    OT Time Calculation (min) 40 min    Activity Tolerance Patient tolerated treatment well    Behavior During Therapy [facility] for tasks assessed/performed              Past Medical History:  Diagnosis Date   Arthritis    Hypertension    Past Surgical History:  Procedure Laterality Date   BACK SURGERY     COLONOSCOPY WITH PROPOFOL N/A 03/14/2020   Procedure: COLONOSCOPY WITH PROPOFOL;  Surgeon: Harvel Quale, MD;  Location: [facility];  Service: Gastroenterology;  Laterality: N/A;  9:15   LAPAROSCOPIC GASTRIC BANDING     left index finger surgery      TOTAL KNEE ARTHROPLASTY Right 03/09/2021   Procedure: TOTAL KNEE ARTHROPLASTY;  Surgeon: Earlie Server, MD;  Location: [facility];  Service: Orthopedics;  Laterality: Right;   TOTAL KNEE ARTHROPLASTY Left 06/01/2021   Procedure: TOTAL KNEE ARTHROPLASTY;  Surgeon: Earlie Server, MD;  Location: [facility];  Service: Orthopedics;  Laterality: Left;   Patient Active Problem List   Diagnosis Date Noted   CELLULITIS, LEFT HAND 10/16/2009    ONSET DATE: 04/05/22  REFERRING DIAG: R shoulder Arthoplasty and rortator cuff repair  THERAPY DIAG:  Acute pain of right shoulder  Shoulder stiffness, right  Other symptoms and signs involving the musculoskeletal system  Rationale for Evaluation and Treatment: Rehabilitation  SUBJECTIVE:   SUBJECTIVE STATEMENT: "I still have that pain inside the joint."  PERTINENT HISTORY: In 2023 pt was having a lot of pain with  movements in his R shoulder. He tore his rotator cuff pulling himself up off the ground. Pt had 3 injections prior to deciding on surgery.   PRECAUTIONS: Shoulder  WEIGHT BEARING RESTRICTIONS: Yes No weight at this time  PAIN:  Are you having pain? Yes: NPRS scale: 6/10 Pain location: trapezius and anterior shoulder girdle Pain description: tight, aching Aggravating factors: movment Relieving factors: supported pressure relief, medication  FALLS: Has patient fallen in last 6 months? No  PATIENT GOALS: To get back to normal  NEXT MD VISIT: 06/13/22  OBJECTIVE:   HAND DOMINANCE: Right  ADLs: Overall ADLs: Pt requiring increased time and up to min assist for dressing, bathing and grooming. All IADL's, pt has his wife assist/complete for him at this time.   FUNCTIONAL OUTCOME MEASURES: FOTO: 28.54  UPPER EXTREMITY ROM:     Passive ROM Right Eval Supine  Shoulder flexion 86  Shoulder abduction 101  Shoulder internal rotation 90  Shoulder external rotation 12  (Blank rows = not tested)  UPPER EXTREMITY MMT:     MMT Right eval  Shoulder flexion   Shoulder abduction   Shoulder adduction   Shoulder extension   Shoulder internal rotation   Shoulder external rotation   Elbow flexion   Elbow extension   (Blank rows = not tested)   OBSERVATIONS: Severe fascial restriction noted in the trapezius, biceps, anterior shoulder girdle, scapula, and subscapularis  TODAY'S TREATMENT:                                                                                                                              DATE:   05/31/22 -Manual Therapy: myofascial release and trigger point applied to the RUE at the biceps, subscapularis, axillary region, and trapezius to address pain and fascial restrictions in order to improve ROM. -AA/ROM: supine-protraction, 10 reps, flexion 5 reps -AA/ROM: standing-flexion 5 reps, abduction, horizontal abduction 10 reps -Yellow theraband:  standing-protraction, flexion, 10 reps  05/28/22 -Manual Therapy: myofascial release and trigger point applied to the RUE at the biceps, subscapularis, axillary region, and trapezius to address pain and fascial restrictions in order to improve ROM. -AA/ROM: supine, protraction, flexion, abduction, horizontal abduction, er/IR, x10 -Isometrics: flexion, extension, abduction, er, IR, 5x10" -Wall Slides: flexion and abduction, x10  05/17/22 -Manual Therapy: myofascial release and trigger point applied to the RUE at the biceps, subscapularis, axillary region, and trapezius to address pain and fascial restrictions in order to improve ROM. -P/ROM: flexion, abduction, horizontal abduction, er/IR, x10 -AA/ROM: supine, protraction, flexion, abduction, horizontal abduction, er/IR, x10 -Pulleys: flexion and abduction, x60"   PATIENT EDUCATION: Education details: discussed performing AA/ROM in sitting or standing Person educated: Patient Education method: Explanation, Demonstration, and Handouts Education comprehension: verbalized understanding and returned demonstration  HOME EXERCISE PROGRAM: 1/23: Table Slides 1/26: Thumb tacs 2/7: AA/ROM 2/20: Isometrics and Wall Slides  GOALS: Goals reviewed with patient? Yes  SHORT TERM GOALS: Target date: 05/31/22  Pt will decrease fascial restrictions to minimal amounts or less in the RUE, in order to improve mobility required for overhead reaching tasks.  Goal status: IN PROGRESS  2.  Pt will improve P/ROM to [facility] in RUE to improve ability to perform dressing tasks with minimal compensatory techniques.  Goal status: IN PROGRESS  3.  Pt will improve strength to 4/5 in RUE to improve ability to assist LUE with lifting objects needed during dressing, bathing, and grooming tasks. Goal status: IN PROGRESS   LONG TERM GOALS: Target date: 07/05/22  Pt will be provided and educated on HEP to improve mobility in RUE required for ADL completion.  Goal  status: IN PROGRESS  2.  Pt will decrease pain in RUE to 3/10 or less in order to sleep for 3+ consecutive hours without waking due to pain.  Goal status: IN PROGRESS  3.  Pt will increase A/ROM of RUE to [facility] to improve ability to reach overhead and behind back during dressing and bathing tasks.  Goal status: IN PROGRESS  4.  Pt will increase strength in RUE to 5/5 to improve ability to perform lifting tasks during cooking and cleaning, with no compensatory techniques.  Goal status: IN PROGRESS   ASSESSMENT:  CLINICAL IMPRESSION: Pt reporting a catching pain in his shoulder joint, no pain with P/ROM, pain noted during flexion with AA/ROM in supine at the 140 degree mark ascending, 45 degree mark descending. Trialed  AA/ROM in standing with joint distraction during flexion and pt with lesser pain during flexion. Pt completing yellow theraband strengthening, good effort and min pain, limited ROM with good form. Discussed working on keeping good form and not arching back when reaching for items, it's ok to use LUE to assist once RUE has reached as far he can. Verbal cuing for form and technique.   PERFORMANCE DEFICITS: in functional skills including ADLs, IADLs, ROM, strength, pain, fascial restrictions, Gross motor control, body mechanics, and UE functional use.   PLAN:  OT FREQUENCY: 2x/week  OT DURATION: 8 weeks  PLANNED INTERVENTIONS: self care/ADL training, therapeutic exercise, therapeutic activity, manual therapy, scar mobilization, passive range of motion, functional mobility training, electrical stimulation, ultrasound, moist heat, cryotherapy, patient/family education, coping strategies training, and DME and/or AE instructions  RECOMMENDED OTHER SERVICES: N/A  CONSULTED AND AGREED WITH PLAN OF CARE: Patient  PLAN FOR NEXT SESSION: manual therapy, AA/ROM in standing and with joint distraction to bypass catching pain, therapy ball roll up wall into flexion   Guadelupe Sabin,  OTR/L  [phone] 05/31/2022, 11:50 AM

## 2022-06-04 ENCOUNTER — Ambulatory Visit (HOSPITAL_COMMUNITY): Payer: BC Managed Care – PPO | Admitting: Occupational Therapy

## 2022-06-04 ENCOUNTER — Encounter (HOSPITAL_COMMUNITY): Payer: Self-pay | Admitting: Occupational Therapy

## 2022-06-04 DIAGNOSIS — M25611 Stiffness of right shoulder, not elsewhere classified: Secondary | ICD-10-CM | POA: Diagnosis not present

## 2022-06-04 DIAGNOSIS — R29898 Other symptoms and signs involving the musculoskeletal system: Secondary | ICD-10-CM

## 2022-06-04 DIAGNOSIS — M25511 Pain in right shoulder: Secondary | ICD-10-CM | POA: Diagnosis not present

## 2022-06-04 NOTE — Therapy (Signed)
OUTPATIENT OCCUPATIONAL THERAPY ORTHO TREATMENT NOTE  Patient Name: Riley Larson MRN: [id number] DOB:[birth year], 52 y.o., male Today's Date: 06/04/2022  PCP: Sharilyn Sites, MD REFERRING PROVIDER: Earlie Server, MD  END OF SESSION:  OT End of Session - 06/04/22 0901     Visit Number 7    Number of Visits 17    Date for OT Re-Evaluation 07/05/22    Authorization Type BC[employer]    Progress Note Due on Visit 10    OT Start Time 0905    OT Stop Time 0945    OT Time Calculation (min) 40 min    Activity Tolerance Patient tolerated treatment well    Behavior During Therapy [facility] for tasks assessed/performed              Past Medical History:  Diagnosis Date   Arthritis    Hypertension    Past Surgical History:  Procedure Laterality Date   BACK SURGERY     COLONOSCOPY WITH PROPOFOL N/A 03/14/2020   Procedure: COLONOSCOPY WITH PROPOFOL;  Surgeon: Harvel Quale, MD;  Location: [facility];  Service: Gastroenterology;  Laterality: N/A;  9:15   LAPAROSCOPIC GASTRIC BANDING     left index finger surgery      TOTAL KNEE ARTHROPLASTY Right 03/09/2021   Procedure: TOTAL KNEE ARTHROPLASTY;  Surgeon: Earlie Server, MD;  Location: [facility];  Service: Orthopedics;  Laterality: Right;   TOTAL KNEE ARTHROPLASTY Left 06/01/2021   Procedure: TOTAL KNEE ARTHROPLASTY;  Surgeon: Earlie Server, MD;  Location: [facility];  Service: Orthopedics;  Laterality: Left;   Patient Active Problem List   Diagnosis Date Noted   CELLULITIS, LEFT HAND 10/16/2009    ONSET DATE: 04/05/22  REFERRING DIAG: R shoulder Arthoplasty and rortator cuff repair  THERAPY DIAG:  Acute pain of right shoulder  Shoulder stiffness, right  Other symptoms and signs involving the musculoskeletal system  Rationale for Evaluation and Treatment: Rehabilitation  SUBJECTIVE:   SUBJECTIVE STATEMENT: "My arm was feeling good till I got here."  PERTINENT HISTORY: In 2023 pt was having a lot of pain with  movements in his R shoulder. He tore his rotator cuff pulling himself up off the ground. Pt had 3 injections prior to deciding on surgery.   PRECAUTIONS: Shoulder  WEIGHT BEARING RESTRICTIONS: Yes No weight at this time  PAIN:  Are you having pain? Yes: NPRS scale: 6/10 Pain location: trapezius and anterior shoulder girdle Pain description: tight, aching Aggravating factors: movment Relieving factors: supported pressure relief, medication  FALLS: Has patient fallen in last 6 months? No  PATIENT GOALS: To get back to normal  NEXT MD VISIT: 06/13/22  OBJECTIVE:   HAND DOMINANCE: Right  ADLs: Overall ADLs: Pt requiring increased time and up to min assist for dressing, bathing and grooming. All IADL's, pt has his wife assist/complete for him at this time.   FUNCTIONAL OUTCOME MEASURES: FOTO: 28.54  UPPER EXTREMITY ROM:     Passive ROM Right Eval Supine  Shoulder flexion 86  Shoulder abduction 101  Shoulder internal rotation 90  Shoulder external rotation 12  (Blank rows = not tested)  UPPER EXTREMITY MMT:     MMT Right eval  Shoulder flexion   Shoulder abduction   Shoulder adduction   Shoulder extension   Shoulder internal rotation   Shoulder external rotation   Elbow flexion   Elbow extension   (Blank rows = not tested)   OBSERVATIONS: Severe fascial restriction noted in the trapezius, biceps, anterior shoulder girdle, scapula, and  subscapularis   TODAY'S TREATMENT:                                                                                                                              DATE:   06/04/22 -Manual Therapy: myofascial release and trigger point applied to the RUE at the biceps, subscapularis, axillary region, and trapezius to address pain and fascial restrictions in order to improve ROM. -AA/ROM: standing, flexion, abduction, protraction, horizontal abduction, er/IR, x10 -Scapular Strengthening: red theraband, extension, rows, retraction,  x15 -Stretching: er in the doorway, bicep on the wall, 4x10" -[employer]: flexion on the wall, x10 -UBE, level 1, 3.5+ KPH, 3' forwards and backwards  05/31/22 -Manual Therapy: myofascial release and trigger point applied to the RUE at the biceps, subscapularis, axillary region, and trapezius to address pain and fascial restrictions in order to improve ROM. -AA/ROM: supine-protraction, 10 reps, flexion 5 reps -AA/ROM: standing-flexion 5 reps, abduction, horizontal abduction 10 reps -Yellow theraband: standing-protraction, flexion, 10 reps  05/28/22 -Manual Therapy: myofascial release and trigger point applied to the RUE at the biceps, subscapularis, axillary region, and trapezius to address pain and fascial restrictions in order to improve ROM. -AA/ROM: supine, protraction, flexion, abduction, horizontal abduction, er/IR, x10 -Isometrics: flexion, extension, abduction, er, IR, 5x10" -Wall Slides: flexion and abduction, x10    PATIENT EDUCATION: Education details: Stretches (er and biceps) Person educated: Patient Education method: Explanation, Demonstration, and Handouts Education comprehension: verbalized understanding and returned demonstration  HOME EXERCISE PROGRAM: 1/23: Table Slides 1/26: Thumb tacs 2/7: AA/ROM 2/20: Isometrics and Wall Slides 2/27: Stretches (er and biceps)  GOALS: Goals reviewed with patient? Yes  SHORT TERM GOALS: Target date: 05/31/22  Pt will decrease fascial restrictions to minimal amounts or less in the RUE, in order to improve mobility required for overhead reaching tasks.  Goal status: IN PROGRESS  2.  Pt will improve P/ROM to [facility] in RUE to improve ability to perform dressing tasks with minimal compensatory techniques.  Goal status: IN PROGRESS  3.  Pt will improve strength to 4/5 in RUE to improve ability to assist LUE with lifting objects needed during dressing, bathing, and grooming tasks. Goal status: IN PROGRESS   LONG TERM GOALS:  Target date: 07/05/22  Pt will be provided and educated on HEP to improve mobility in RUE required for ADL completion.  Goal status: IN PROGRESS  2.  Pt will decrease pain in RUE to 3/10 or less in order to sleep for 3+ consecutive hours without waking due to pain.  Goal status: IN PROGRESS  3.  Pt will increase A/ROM of RUE to [facility] to improve ability to reach overhead and behind back during dressing and bathing tasks.  Goal status: IN PROGRESS  4.  Pt will increase strength in RUE to 5/5 to improve ability to perform lifting tasks during cooking and cleaning, with no compensatory techniques.  Goal status: IN PROGRESS   ASSESSMENT:  CLINICAL IMPRESSION: Pt reporting a catching  pain in his shoulder joint, no pain with P/ROM, pain noted during flexion with AA/ROM in supine at the 140 degree mark ascending, 45 degree mark descending. Trialed AA/ROM in standing with joint distraction during flexion and pt with lesser pain during flexion. Pt completing yellow theraband strengthening, good effort and min pain, limited ROM with good form. Discussed working on keeping good form and not arching back when reaching for items, it's ok to use LUE to assist once RUE has reached as far he can. Verbal cuing for form and technique.   PERFORMANCE DEFICITS: in functional skills including ADLs, IADLs, ROM, strength, pain, fascial restrictions, Gross motor control, body mechanics, and UE functional use.   PLAN:  OT FREQUENCY: 2x/week  OT DURATION: 8 weeks  PLANNED INTERVENTIONS: self care/ADL training, therapeutic exercise, therapeutic activity, manual therapy, scar mobilization, passive range of motion, functional mobility training, electrical stimulation, ultrasound, moist heat, cryotherapy, patient/family education, coping strategies training, and DME and/or AE instructions  RECOMMENDED OTHER SERVICES: N/A  CONSULTED AND AGREED WITH PLAN OF CARE: Patient  PLAN FOR NEXT SESSION: manual therapy, AA/ROM  in standing and with joint distraction to bypass catching pain, therapy ball roll up wall into flexion   Paulita Fujita, OTR/L [phone] 06/04/2022, 9:03 AM

## 2022-06-07 ENCOUNTER — Ambulatory Visit (HOSPITAL_COMMUNITY): Payer: BC Managed Care – PPO | Attending: Orthopedic Surgery | Admitting: Occupational Therapy

## 2022-06-07 ENCOUNTER — Encounter (HOSPITAL_COMMUNITY): Payer: Self-pay | Admitting: Occupational Therapy

## 2022-06-07 DIAGNOSIS — R29898 Other symptoms and signs involving the musculoskeletal system: Secondary | ICD-10-CM

## 2022-06-07 DIAGNOSIS — M25611 Stiffness of right shoulder, not elsewhere classified: Secondary | ICD-10-CM | POA: Insufficient documentation

## 2022-06-07 DIAGNOSIS — M25511 Pain in right shoulder: Secondary | ICD-10-CM | POA: Insufficient documentation

## 2022-06-07 NOTE — Therapy (Unsigned)
OUTPATIENT OCCUPATIONAL THERAPY ORTHO TREATMENT NOTE  Patient Name: Riley Larson MRN: [id number] DOB:May 26, 1970, 52 y.o., male Today's Date: 06/07/2022  PCP: Sharilyn Sites, MD REFERRING PROVIDER: Earlie Server, MD  END OF SESSION:  OT End of Session - 06/07/22 [id number]     Visit Number 8    Number of Visits 17    Date for OT Re-Evaluation 07/05/22    Authorization Type [employer]    Progress Note Due on Visit 10    OT Start Time 0745    OT Stop Time 0815    OT Time Calculation (min) 30 min    Activity Tolerance Patient tolerated treatment well    Behavior During Therapy WFL for tasks assessed/performed            `   Past Medical History:  Diagnosis Date   Arthritis    Hypertension    Past Surgical History:  Procedure Laterality Date   BACK SURGERY     COLONOSCOPY WITH PROPOFOL N/A 03/14/2020   Procedure: COLONOSCOPY WITH PROPOFOL;  Surgeon: Harvel Quale, MD;  Location: [facility];  Service: Gastroenterology;  Laterality: N/A;  9:15   LAPAROSCOPIC GASTRIC BANDING     left index finger surgery      TOTAL KNEE ARTHROPLASTY Right 03/09/2021   Procedure: TOTAL KNEE ARTHROPLASTY;  Surgeon: Earlie Server, MD;  Location: [facility];  Service: Orthopedics;  Laterality: Right;   TOTAL KNEE ARTHROPLASTY Left 06/01/2021   Procedure: TOTAL KNEE ARTHROPLASTY;  Surgeon: Earlie Server, MD;  Location: [facility];  Service: Orthopedics;  Laterality: Left;   Patient Active Problem List   Diagnosis Date Noted   CELLULITIS, LEFT HAND 10/16/2009    ONSET DATE: 04/05/22  REFERRING DIAG: R shoulder Arthoplasty and rortator cuff repair  THERAPY DIAG:  Acute pain of right shoulder  Shoulder stiffness, right  Other symptoms and signs involving the musculoskeletal system  Rationale for Evaluation and Treatment: Rehabilitation  SUBJECTIVE:   SUBJECTIVE STATEMENT: "My arm was feeling good till I got here."  PERTINENT HISTORY: In 2023 pt was having a lot of pain with  movements in his R shoulder. He tore his rotator cuff pulling himself up off the ground. Pt had 3 injections prior to deciding on surgery.   PRECAUTIONS: Shoulder  WEIGHT BEARING RESTRICTIONS: Yes No weight at this time  PAIN:  Are you having pain? Yes: NPRS scale: 6/10 Pain location: trapezius and anterior shoulder girdle Pain description: tight, aching Aggravating factors: movment Relieving factors: supported pressure relief, medication  FALLS: Has patient fallen in last 6 months? No  PATIENT GOALS: To get back to normal  NEXT MD VISIT: 06/13/22  OBJECTIVE:   HAND DOMINANCE: Right  ADLs: Overall ADLs: Pt requiring increased time and up to min assist for dressing, bathing and grooming. All IADL's, pt has his wife assist/complete for him at this time.   FUNCTIONAL OUTCOME MEASURES: FOTO: 28.54  UPPER EXTREMITY ROM:     Passive ROM Right Eval Supine  Shoulder flexion 86  Shoulder abduction 101  Shoulder internal rotation 90  Shoulder external rotation 12  (Blank rows = not tested)  UPPER EXTREMITY MMT:     MMT Right eval  Shoulder flexion   Shoulder abduction   Shoulder adduction   Shoulder extension   Shoulder internal rotation   Shoulder external rotation   Elbow flexion   Elbow extension   (Blank rows = not tested)   OBSERVATIONS: Severe fascial restriction noted in the trapezius, biceps, anterior shoulder girdle, scapula,  and subscapularis   TODAY'S TREATMENT:                                                                                                                              DATE:   06/07/22 -Manual Therapy: myofascial release and trigger point applied to the RUE at the biceps, subscapularis, axillary region, and trapezius to address pain and fascial restrictions in order to improve ROM. -AA/ROM: standing, flexion, abduction, protraction, horizontal abduction, er/IR, x10, Joint distraction to minimize catching -A/ROM: seating, flexion, abduction,  protraction, horizontal abduction, er/IR, x5,   06/04/22 -Manual Therapy: myofascial release and trigger point applied to the RUE at the biceps, subscapularis, axillary region, and trapezius to address pain and fascial restrictions in order to improve ROM. -AA/ROM: standing, flexion, abduction, protraction, horizontal abduction, er/IR, x10 -Scapular Strengthening: red theraband, extension, rows, retraction, x15 -Stretching: er in the doorway, bicep on the wall, 4x10" -[employer]: flexion on the wall, x10 -UBE, level 1, 3.5+ KPH, 3' forwards and backwards  05/31/22 -Manual Therapy: myofascial release and trigger point applied to the RUE at the biceps, subscapularis, axillary region, and trapezius to address pain and fascial restrictions in order to improve ROM. -AA/ROM: supine-protraction, 10 reps, flexion 5 reps -AA/ROM: standing-flexion 5 reps, abduction, horizontal abduction 10 reps -Yellow theraband: standing-protraction, flexion, 10 reps    PATIENT EDUCATION: Education details:  Person educated: Patient Education method: Consulting civil engineer, Media planner, and Handouts Education comprehension: verbalized understanding and returned demonstration  HOME EXERCISE PROGRAM: 1/23: Table Slides 1/26: Thumb tacs 2/7: AA/ROM 2/20: Isometrics and Wall Slides 2/27: Stretches (er and biceps) 3/1: A/ROM  GOALS: Goals reviewed with patient? Yes  SHORT TERM GOALS: Target date: 05/31/22  Pt will decrease fascial restrictions to minimal amounts or less in the RUE, in order to improve mobility required for overhead reaching tasks.  Goal status: IN PROGRESS  2.  Pt will improve P/ROM to [facility] in RUE to improve ability to perform dressing tasks with minimal compensatory techniques.  Goal status: IN PROGRESS  3.  Pt will improve strength to 4/5 in RUE to improve ability to assist LUE with lifting objects needed during dressing, bathing, and grooming tasks. Goal status: IN PROGRESS   LONG TERM GOALS:  Target date: 07/05/22  Pt will be provided and educated on HEP to improve mobility in RUE required for ADL completion.  Goal status: IN PROGRESS  2.  Pt will decrease pain in RUE to 3/10 or less in order to sleep for 3+ consecutive hours without waking due to pain.  Goal status: IN PROGRESS  3.  Pt will increase A/ROM of RUE to [facility] to improve ability to reach overhead and behind back during dressing and bathing tasks.  Goal status: IN PROGRESS  4.  Pt will increase strength in RUE to 5/5 to improve ability to perform lifting tasks during cooking and cleaning, with no compensatory techniques.  Goal status: IN PROGRESS   ASSESSMENT:  CLINICAL IMPRESSION: Pt continuing  to have pain and limitations with ROM. He is unable to reach out and up with his R shoulder without assist from the LUE or a dowel rod. This session focused on AA/ROM, along with stretching, ball rolls on the wall, and starting the UBE. He reports that his pain at times is shooting up to a 10/10, however when back at rest by his side quickly decreasing back to minimal amounts of pain. When completing Scapular strengthening this session, he noted crunchiness in his R shoulder girdle specifically during retraction and rows. OT providing verbal and tactile cuing to limit compensatory strategies, along with addressing positioning and technique.   PERFORMANCE DEFICITS: in functional skills including ADLs, IADLs, ROM, strength, pain, fascial restrictions, Gross motor control, body mechanics, and UE functional use.   PLAN:  OT FREQUENCY: 2x/week  OT DURATION: 8 weeks  PLANNED INTERVENTIONS: self care/ADL training, therapeutic exercise, therapeutic activity, manual therapy, scar mobilization, passive range of motion, functional mobility training, electrical stimulation, ultrasound, moist heat, cryotherapy, patient/family education, coping strategies training, and DME and/or AE instructions  RECOMMENDED OTHER SERVICES:  N/A  CONSULTED AND AGREED WITH PLAN OF CARE: Patient  PLAN FOR NEXT SESSION: manual therapy, AA/ROM in standing and with joint distraction to bypass catching pain, therapy ball roll up wall into flexion, scapular strengthening   Paulita Fujita, OTR/L [phone] 06/07/2022, 8:17 AM

## 2022-06-07 NOTE — Patient Instructions (Signed)

## 2022-06-13 ENCOUNTER — Encounter (HOSPITAL_COMMUNITY): Payer: Self-pay | Admitting: Occupational Therapy

## 2022-06-13 ENCOUNTER — Ambulatory Visit (HOSPITAL_COMMUNITY): Payer: BC Managed Care – PPO | Admitting: Occupational Therapy

## 2022-06-13 DIAGNOSIS — R29898 Other symptoms and signs involving the musculoskeletal system: Secondary | ICD-10-CM | POA: Diagnosis not present

## 2022-06-13 DIAGNOSIS — M25511 Pain in right shoulder: Secondary | ICD-10-CM

## 2022-06-13 DIAGNOSIS — M25611 Stiffness of right shoulder, not elsewhere classified: Secondary | ICD-10-CM | POA: Diagnosis not present

## 2022-06-13 NOTE — Therapy (Signed)
OUTPATIENT OCCUPATIONAL THERAPY ORTHO TREATMENT NOTE  Patient Name: Riley Larson MRN: [id number] DOB:June 10, 1970, 52 y.o., male Today's Date: 06/13/2022  PCP: Sharilyn Sites, MD REFERRING PROVIDER: Earlie Server, MD  END OF SESSION:  OT End of Session - 06/13/22 1028     Visit Number 9    Number of Visits 17    Date for OT Re-Evaluation 07/05/22    Authorization Type [employer]    Progress Note Due on Visit 10    OT Start Time [phone]    OT Stop Time 1028    OT Time Calculation (min) 42 min    Activity Tolerance Patient tolerated treatment well    Behavior During Therapy WFL for tasks assessed/performed             `   Past Medical History:  Diagnosis Date   Arthritis    Hypertension    Past Surgical History:  Procedure Laterality Date   BACK SURGERY     COLONOSCOPY WITH PROPOFOL N/A 03/14/2020   Procedure: COLONOSCOPY WITH PROPOFOL;  Surgeon: Harvel Quale, MD;  Location: [facility];  Service: Gastroenterology;  Laterality: N/A;  9:15   LAPAROSCOPIC GASTRIC BANDING     left index finger surgery      TOTAL KNEE ARTHROPLASTY Right 03/09/2021   Procedure: TOTAL KNEE ARTHROPLASTY;  Surgeon: Earlie Server, MD;  Location: [facility];  Service: Orthopedics;  Laterality: Right;   TOTAL KNEE ARTHROPLASTY Left 06/01/2021   Procedure: TOTAL KNEE ARTHROPLASTY;  Surgeon: Earlie Server, MD;  Location: [facility];  Service: Orthopedics;  Laterality: Left;   Patient Active Problem List   Diagnosis Date Noted   CELLULITIS, LEFT HAND 10/16/2009    ONSET DATE: 04/05/22  REFERRING DIAG: R shoulder Arthoplasty and rortator cuff repair  THERAPY DIAG:  Acute pain of right shoulder  Shoulder stiffness, right  Other symptoms and signs involving the musculoskeletal system  Rationale for Evaluation and Treatment: Rehabilitation  SUBJECTIVE:   SUBJECTIVE STATEMENT: "The doctor thinks I have scar tissue."   PERTINENT HISTORY: In 2023 pt was having a lot of pain with  movements in his R shoulder. He tore his rotator cuff pulling himself up off the ground. Pt had 3 injections prior to deciding on surgery.   PRECAUTIONS: Shoulder  WEIGHT BEARING RESTRICTIONS: Yes No weight at this time  PAIN:  Are you having pain? Yes: NPRS scale: 6/10 Pain location: trapezius and anterior shoulder girdle Pain description: tight, aching Aggravating factors: movment Relieving factors: supported pressure relief, medication  FALLS: Has patient fallen in last 6 months? No  PATIENT GOALS: To get back to normal  NEXT MD VISIT: 06/13/22  OBJECTIVE:   HAND DOMINANCE: Right  ADLs: Overall ADLs: Pt requiring increased time and up to min assist for dressing, bathing and grooming. All IADL's, pt has his wife assist/complete for him at this time.   FUNCTIONAL OUTCOME MEASURES: FOTO: 28.54 06/13/22: 55/100  UPPER EXTREMITY ROM:       Assessed supine, er/IR adducted  Passive ROM Right Eval Supine Right 06/13/22  Shoulder flexion 86 155  Shoulder abduction 101 160  Shoulder internal rotation 90 90  Shoulder external rotation 12 78  (Blank rows = not tested)   Assessed seated, er/IR adducted  Active ROM  Right 06/13/22  Shoulder flexion  98  Shoulder abduction  77  Shoulder internal rotation  90  Shoulder external rotation  41  (Blank rows = not tested)  UPPER EXTREMITY MMT:     MMT Right eval  Right 06/13/22  Shoulder flexion    Shoulder abduction    Shoulder adduction    Shoulder extension    Shoulder internal rotation    Shoulder external rotation    Elbow flexion    Elbow extension    (Blank rows = not tested)   OBSERVATIONS: Severe fascial restriction noted in the trapezius, biceps, anterior shoulder girdle, scapula, and subscapularis   TODAY'S TREATMENT:                                                                                                                              DATE:  06/13/22 -Manual Therapy: myofascial release and trigger point  applied to the RUE at the biceps, subscapularis, axillary region, and trapezius to address pain and fascial restrictions in order to improve ROM. -A/ROM: supine- flexion, protraction, horizontal abduction, er/IR, 10 reps -Proximal shoulder strengthening: supine-paddles, criss cross, circles each direction, 10 reps each, 1 rest break -Wall Wash: flexion 1' -Scapula Strengthening: red theraband, extension, retraction, rows, x10 -A/ROM: standing-flexion, abduction, protraction, horizontal abduction, er/IR, 10 reps -Proximal shoulder strengthening: standing-paddles, criss cross, circles each direction, 10 reps each, 1 rest break -UBE: level 1, 2' forward 2' reverse, pace: 6.0   06/07/22 -Manual Therapy: myofascial release and trigger point applied to the RUE at the biceps, subscapularis, axillary region, and trapezius to address pain and fascial restrictions in order to improve ROM. -AA/ROM: standing, flexion, abduction, protraction, horizontal abduction, er/IR, x10, Joint distraction to minimize catching -A/ROM: seating, flexion, abduction, protraction, horizontal abduction, er/IR, x5,   06/04/22 -Manual Therapy: myofascial release and trigger point applied to the RUE at the biceps, subscapularis, axillary region, and trapezius to address pain and fascial restrictions in order to improve ROM. -AA/ROM: standing, flexion, abduction, protraction, horizontal abduction, er/IR, x10 -Scapular Strengthening: red theraband, extension, rows, retraction, x15 -Stretching: er in the doorway, bicep on the wall, 4x10" -[employer]: flexion on the wall, x10 -UBE, level 1, 3.5+ KPH, 3' forwards and backwards   PATIENT EDUCATION: Education details:  Person educated: Patient Education method: Consulting civil engineer, Demonstration, and Handouts Education comprehension: verbalized understanding and returned demonstration  HOME EXERCISE PROGRAM: 1/23: Table Slides 1/26: Thumb tacs 2/7: AA/ROM 2/20: Isometrics and Wall  Slides 2/27: Stretches (er and biceps) 3/1: A/ROM  GOALS: Goals reviewed with patient? Yes  SHORT TERM GOALS: Target date: 05/31/22  Pt will decrease fascial restrictions to minimal amounts or less in the RUE, in order to improve mobility required for overhead reaching tasks.  Goal status: IN PROGRESS  2.  Pt will improve P/ROM to [facility] in RUE to improve ability to perform dressing tasks with minimal compensatory techniques.  Goal status: MET  3.  Pt will improve strength to 4/5 in RUE to improve ability to assist LUE with lifting objects needed during dressing, bathing, and grooming tasks. Goal status: IN PROGRESS   LONG TERM GOALS: Target date: 07/05/22  Pt will be provided and educated on HEP to improve mobility in  RUE required for ADL completion.  Goal status: IN PROGRESS  2.  Pt will decrease pain in RUE to 3/10 or less in order to sleep for 3+ consecutive hours without waking due to pain.  Goal status: IN PROGRESS  3.  Pt will increase A/ROM of RUE to [facility] to improve ability to reach overhead and behind back during dressing and bathing tasks.  Goal status: IN PROGRESS  4.  Pt will increase strength in RUE to 5/5 to improve ability to perform lifting tasks during cooking and cleaning, with no compensatory techniques.  Goal status: IN PROGRESS   ASSESSMENT:  CLINICAL IMPRESSION: Pt reports MD feels his pain and popping are coming from scar tissue. Continued with manual techniques, measured for progress note today. Pt demonstrating improved ROM, has full passive ROM. Continues to have pain at 90 degree mark in supine, less prominent in standing. Added proximal shoulder strengthening in supine and standing today with 1 rest break for fatigue. Verbal cuing for form and technique, cuing not to arch back.   PERFORMANCE DEFICITS: in functional skills including ADLs, IADLs, ROM, strength, pain, fascial restrictions, Gross motor control, body mechanics, and UE functional  use.   PLAN:  OT FREQUENCY: 2x/week  OT DURATION: 8 weeks  PLANNED INTERVENTIONS: self care/ADL training, therapeutic exercise, therapeutic activity, manual therapy, scar mobilization, passive range of motion, functional mobility training, electrical stimulation, ultrasound, moist heat, cryotherapy, patient/family education, coping strategies training, and DME and/or AE instructions  RECOMMENDED OTHER SERVICES: N/A  CONSULTED AND AGREED WITH PLAN OF CARE: Patient  PLAN FOR NEXT SESSION: manual therapy, A/ROM in standing, proximal shoulder strengthening   Guadelupe Sabin, OTR/L  [phone] 06/13/2022, 10:29 AM

## 2022-06-21 ENCOUNTER — Encounter (HOSPITAL_COMMUNITY): Payer: Self-pay | Admitting: Occupational Therapy

## 2022-06-25 ENCOUNTER — Ambulatory Visit (HOSPITAL_COMMUNITY): Payer: BC Managed Care – PPO | Admitting: Occupational Therapy

## 2022-06-25 ENCOUNTER — Encounter (HOSPITAL_COMMUNITY): Payer: Self-pay | Admitting: Occupational Therapy

## 2022-06-25 DIAGNOSIS — R29898 Other symptoms and signs involving the musculoskeletal system: Secondary | ICD-10-CM

## 2022-06-25 DIAGNOSIS — M25611 Stiffness of right shoulder, not elsewhere classified: Secondary | ICD-10-CM | POA: Diagnosis not present

## 2022-06-25 DIAGNOSIS — M25511 Pain in right shoulder: Secondary | ICD-10-CM | POA: Diagnosis not present

## 2022-06-25 NOTE — Patient Instructions (Signed)

## 2022-06-25 NOTE — Therapy (Signed)
OUTPATIENT OCCUPATIONAL THERAPY ORTHO TREATMENT NOTE  Patient Name: Riley Larson MRN: [id number] DOB:November 19, 1970, 52 y.o., male Today's Date: 06/25/2022  PCP: Sharilyn Sites, MD REFERRING PROVIDER: Earlie Server, MD  END OF SESSION:  OT End of Session - 06/25/22 1115     Visit Number 10    Number of Visits 17    Date for OT Re-Evaluation 07/05/22    Authorization Type [employer]    OT Start Time 1031    OT Stop Time 1115    OT Time Calculation (min) 44 min    Activity Tolerance Patient tolerated treatment well    Behavior During Therapy WFL for tasks assessed/performed              `   Past Medical History:  Diagnosis Date   Arthritis    Hypertension    Past Surgical History:  Procedure Laterality Date   BACK SURGERY     COLONOSCOPY WITH PROPOFOL N/A 03/14/2020   Procedure: COLONOSCOPY WITH PROPOFOL;  Surgeon: Harvel Quale, MD;  Location: [facility];  Service: Gastroenterology;  Laterality: N/A;  9:15   LAPAROSCOPIC GASTRIC BANDING     left index finger surgery      TOTAL KNEE ARTHROPLASTY Right 03/09/2021   Procedure: TOTAL KNEE ARTHROPLASTY;  Surgeon: Earlie Server, MD;  Location: [facility];  Service: Orthopedics;  Laterality: Right;   TOTAL KNEE ARTHROPLASTY Left 06/01/2021   Procedure: TOTAL KNEE ARTHROPLASTY;  Surgeon: Earlie Server, MD;  Location: [facility];  Service: Orthopedics;  Laterality: Left;   Patient Active Problem List   Diagnosis Date Noted   CELLULITIS, LEFT HAND 10/16/2009    ONSET DATE: 04/05/22  REFERRING DIAG: R shoulder Arthoplasty and rortator cuff repair  THERAPY DIAG:  Acute pain of right shoulder  Shoulder stiffness, right  Other symptoms and signs involving the musculoskeletal system  Rationale for Evaluation and Treatment: Rehabilitation  SUBJECTIVE:   SUBJECTIVE STATEMENT: "I didn't have any pain on Sunday."   PERTINENT HISTORY: In 2023 pt was having a lot of pain with movements in his R shoulder. He tore  his rotator cuff pulling himself up off the ground. Pt had 3 injections prior to deciding on surgery.   PRECAUTIONS: Shoulder  WEIGHT BEARING RESTRICTIONS: Yes No weight at this time  PAIN:  Are you having pain? Yes: NPRS scale: 3/10 Pain location: trapezius and anterior shoulder girdle Pain description: tight, aching Aggravating factors: movment Relieving factors: supported pressure relief, medication  FALLS: Has patient fallen in last 6 months? No  PATIENT GOALS: To get back to normal  NEXT MD VISIT: None scheduled  OBJECTIVE:   HAND DOMINANCE: Right  ADLs: Overall ADLs: Pt requiring increased time and up to min assist for dressing, bathing and grooming. All IADL's, pt has his wife assist/complete for him at this time.   FUNCTIONAL OUTCOME MEASURES: FOTO: 28.54 06/13/22: 55/100  UPPER EXTREMITY ROM:       Assessed supine, er/IR adducted  Passive ROM Right Eval Supine Right 06/13/22  Shoulder flexion 86 155  Shoulder abduction 101 160  Shoulder internal rotation 90 90  Shoulder external rotation 12 78  (Blank rows = not tested)   Assessed seated, er/IR adducted  Active ROM  Right 06/13/22  Shoulder flexion  98  Shoulder abduction  77  Shoulder internal rotation  90  Shoulder external rotation  41  (Blank rows = not tested)  UPPER EXTREMITY MMT:     MMT Right eval Right 06/13/22  Shoulder flexion  Shoulder abduction    Shoulder adduction    Shoulder extension    Shoulder internal rotation    Shoulder external rotation    Elbow flexion    Elbow extension    (Blank rows = not tested)   OBSERVATIONS: Severe fascial restriction noted in the trapezius, biceps, anterior shoulder girdle, scapula, and subscapularis   TODAY'S TREATMENT:                                                                                                                              DATE:  06/25/22 -Manual Therapy: myofascial release and trigger point applied to the RUE at the  biceps, subscapularis, axillary region, and trapezius to address pain and fascial restrictions in order to improve ROM. -A/ROM: standing-abduction, flexion, protraction, horizontal abduction, er/IR, 10 reps -AA/ROM: flexion beginning and ending at 90 degrees flexion, 10 reps -Proximal shoulder strengthening: standing-paddles, criss cross, circles each direction, 10 reps each, 1 rest break -Scapular theraband: red-row, extension, retraction, 10 reps  -Proximal shoulder strengthening on door with washcloth at 90 degrees flexion, 1'  -Overhead lacing: seated, lacing from top down and then reversing -UBE: level 2, 2' forward 2' reverse, pace: 9.5   06/13/22 -Manual Therapy: myofascial release and trigger point applied to the RUE at the biceps, subscapularis, axillary region, and trapezius to address pain and fascial restrictions in order to improve ROM. -A/ROM: supine- flexion, protraction, horizontal abduction, er/IR, 10 reps -Proximal shoulder strengthening: supine-paddles, criss cross, circles each direction, 10 reps each, 1 rest break -Wall Wash: flexion 1' -Scapula Strengthening: red theraband, extension, retraction, rows, x10 -A/ROM: standing-flexion, abduction, protraction, horizontal abduction, er/IR, 10 reps -Proximal shoulder strengthening: standing-paddles, criss cross, circles each direction, 10 reps each, 1 rest break -UBE: level 1, 2' forward 2' reverse, pace: 6.0   06/07/22 -Manual Therapy: myofascial release and trigger point applied to the RUE at the biceps, subscapularis, axillary region, and trapezius to address pain and fascial restrictions in order to improve ROM. -AA/ROM: standing, flexion, abduction, protraction, horizontal abduction, er/IR, x10, Joint distraction to minimize catching -A/ROM: seating, flexion, abduction, protraction, horizontal abduction, er/IR, x5,     PATIENT EDUCATION: Education details: red scapular theraband; practice functional reaching Person  educated: Patient Education method: Explanation, Demonstration, and Handouts Education comprehension: verbalized understanding and returned demonstration  HOME EXERCISE PROGRAM: 1/23: Table Slides 1/26: Thumb tacs 2/7: AA/ROM 2/20: Isometrics and Wall Slides 2/27: Stretches (er and biceps) 3/1: A/ROM 3/19: red scapular theraband; practice functional reaching  GOALS: Goals reviewed with patient? Yes  SHORT TERM GOALS: Target date: 05/31/22  Pt will decrease fascial restrictions to minimal amounts or less in the RUE, in order to improve mobility required for overhead reaching tasks.  Goal status: IN PROGRESS  2.  Pt will improve P/ROM to [facility] in RUE to improve ability to perform dressing tasks with minimal compensatory techniques.  Goal status: MET  3.  Pt will improve strength to 4/5 in RUE to improve ability  to assist LUE with lifting objects needed during dressing, bathing, and grooming tasks. Goal status: IN PROGRESS   LONG TERM GOALS: Target date: 07/05/22  Pt will be provided and educated on HEP to improve mobility in RUE required for ADL completion.  Goal status: IN PROGRESS  2.  Pt will decrease pain in RUE to 3/10 or less in order to sleep for 3+ consecutive hours without waking due to pain.  Goal status: IN PROGRESS  3.  Pt will increase A/ROM of RUE to [facility] to improve ability to reach overhead and behind back during dressing and bathing tasks.  Goal status: IN PROGRESS  4.  Pt will increase strength in RUE to 5/5 to improve ability to perform lifting tasks during cooking and cleaning, with no compensatory techniques.  Goal status: IN PROGRESS   ASSESSMENT:  CLINICAL IMPRESSION: Pt reports he did not have any pain on Sunday and was able to do a lot with his arm. Did have pain yesterday and today, continued with manual techniques with OT noting improvement in fascial restrictions compared to previous session. Completed A/ROM in standing, pt able to achieve abduction  ROM to 75%, flexion to 50%. Transitioned to AA/ROM for flexion and only completed above 90 degrees. Continued with proximal shoulder strengthening, added overhead lacing. Verbal cuing for form and technique.   PERFORMANCE DEFICITS: in functional skills including ADLs, IADLs, ROM, strength, pain, fascial restrictions, Gross motor control, body mechanics, and UE functional use.   PLAN:  OT FREQUENCY: 2x/week  OT DURATION: 8 weeks  PLANNED INTERVENTIONS: self care/ADL training, therapeutic exercise, therapeutic activity, manual therapy, scar mobilization, passive range of motion, functional mobility training, electrical stimulation, ultrasound, moist heat, cryotherapy, patient/family education, coping strategies training, and DME and/or AE instructions  CONSULTED AND AGREED WITH PLAN OF CARE: Patient  PLAN FOR NEXT SESSION: manual therapy, A/ROM in standing, proximal shoulder strengthening, add functional reaching into/out of cabinets   Guadelupe Sabin, OTR/L  [phone] 06/25/2022, 11:15 AM

## 2022-06-28 ENCOUNTER — Encounter (HOSPITAL_COMMUNITY): Payer: Self-pay | Admitting: Occupational Therapy

## 2022-06-28 ENCOUNTER — Ambulatory Visit (HOSPITAL_COMMUNITY): Payer: BC Managed Care – PPO | Admitting: Occupational Therapy

## 2022-06-28 DIAGNOSIS — R29898 Other symptoms and signs involving the musculoskeletal system: Secondary | ICD-10-CM

## 2022-06-28 DIAGNOSIS — M25611 Stiffness of right shoulder, not elsewhere classified: Secondary | ICD-10-CM

## 2022-06-28 DIAGNOSIS — M25511 Pain in right shoulder: Secondary | ICD-10-CM

## 2022-06-28 NOTE — Therapy (Signed)
OUTPATIENT OCCUPATIONAL THERAPY ORTHO TREATMENT NOTE  Patient Name: Riley Larson MRN: [id number] DOB:1970/09/27, 52 y.o., male Today's Date: 06/28/2022  PCP: Sharilyn Sites, MD REFERRING PROVIDER: Earlie Server, MD  END OF SESSION:  OT End of Session - 06/28/22 0901     Visit Number 11    Number of Visits 17    Date for OT Re-Evaluation 07/05/22    Authorization Type BCBS    OT Start Time 0814    OT Stop Time 0856    OT Time Calculation (min) 42 min    Activity Tolerance Patient tolerated treatment well    Behavior During Therapy [facility] for tasks assessed/performed               `   Past Medical History:  Diagnosis Date   Arthritis    Hypertension    Past Surgical History:  Procedure Laterality Date   BACK SURGERY     COLONOSCOPY WITH PROPOFOL N/A 03/14/2020   Procedure: COLONOSCOPY WITH PROPOFOL;  Surgeon: Harvel Quale, MD;  [facility]: [facility];  Service: Gastroenterology;  Laterality: N/A;  9:15   LAPAROSCOPIC GASTRIC BANDING     left index finger surgery      TOTAL KNEE ARTHROPLASTY Right 03/09/2021   Procedure: TOTAL KNEE ARTHROPLASTY;  Surgeon: Earlie Server, MD;  Location: [facility];  Service: Orthopedics;  Laterality: Right;   TOTAL KNEE ARTHROPLASTY Left 06/01/2021   Procedure: TOTAL KNEE ARTHROPLASTY;  Surgeon: Earlie Server, MD;  Location: [facility];  Service: Orthopedics;  Laterality: Left;   Patient Active Problem List   Diagnosis Date Noted   CELLULITIS, LEFT HAND 10/16/2009    ONSET DATE: 04/05/22  REFERRING DIAG: R shoulder Arthoplasty and rortator cuff repair  THERAPY DIAG:  Acute pain of right shoulder  Shoulder stiffness, right  Other symptoms and signs involving the musculoskeletal system  Rationale for Evaluation and Treatment: Rehabilitation  SUBJECTIVE:   SUBJECTIVE STATEMENT: "I was sore on Wednesday."  PERTINENT HISTORY: In 2023 pt was having a lot of pain with movements in his R shoulder. He tore his  rotator cuff pulling himself up off the ground. Pt had 3 injections prior to deciding on surgery.   PRECAUTIONS: Shoulder  WEIGHT BEARING RESTRICTIONS: Yes No weight at this time  PAIN:  Are you having pain? Yes: NPRS scale: 3/10 Pain location: trapezius and anterior shoulder girdle Pain description: tight, aching Aggravating factors: movment Relieving factors: supported pressure relief, medication  FALLS: Has patient fallen in last 6 months? No  PATIENT GOALS: To get back to normal  NEXT MD VISIT: None scheduled  OBJECTIVE:   HAND DOMINANCE: Right  ADLs: Overall ADLs: Pt requiring increased time and up to min assist for dressing, bathing and grooming. All IADL's, pt has his wife assist/complete for him at this time.   FUNCTIONAL OUTCOME MEASURES: FOTO: 28.54 06/13/22: 55/100  UPPER EXTREMITY ROM:       Assessed supine, er/IR adducted  Passive ROM Right Eval Supine Right 06/13/22  Shoulder flexion 86 155  Shoulder abduction 101 160  Shoulder internal rotation 90 90  Shoulder external rotation 12 78  (Blank rows = not tested)   Assessed seated, er/IR adducted  Active ROM  Right 06/13/22  Shoulder flexion  98  Shoulder abduction  77  Shoulder internal rotation  90  Shoulder external rotation  41  (Blank rows = not tested)  UPPER EXTREMITY MMT:     MMT Right eval Right 06/13/22  Shoulder flexion    Shoulder  abduction    Shoulder adduction    Shoulder extension    Shoulder internal rotation    Shoulder external rotation    Elbow flexion    Elbow extension    (Blank rows = not tested)   OBSERVATIONS: Severe fascial restriction noted in the trapezius, biceps, anterior shoulder girdle, scapula, and subscapularis   TODAY'S TREATMENT:                                                                                                                              DATE:  06/28/22 -Manual Therapy: myofascial release and trigger point applied to the RUE at the  biceps, subscapularis, axillary region, and trapezius to address pain and fascial restrictions in order to improve ROM. -A/ROM: sidelying-abduction, flexion, protraction, horizontal abduction, er/IR, 10 reps -A/ROM: standing-abduction, flexion, protraction, horizontal abduction, er/IR, 10 reps -Proximal shoulder strengthening: standing-paddles, criss cross, circles each direction, 10 reps each, 1 rest break -Scapular theraband: red-row, extension, retraction, 10 reps  -Overhead lacing: seated, lacing from top down and then reversing -Functional Reaching: pt placing 10 cones onto top shelf of overhead cabinet in flexion, removing in abduction  06/25/22 -Manual Therapy: myofascial release and trigger point applied to the RUE at the biceps, subscapularis, axillary region, and trapezius to address pain and fascial restrictions in order to improve ROM. -A/ROM: standing-abduction, flexion, protraction, horizontal abduction, er/IR, 10 reps -AA/ROM: flexion beginning and ending at 90 degrees flexion, 10 reps -Proximal shoulder strengthening: standing-paddles, criss cross, circles each direction, 10 reps each, 1 rest break -Scapular theraband: red-row, extension, retraction, 10 reps  -Proximal shoulder strengthening on door with washcloth at 90 degrees flexion, 1'  -Overhead lacing: seated, lacing from top down and then reversing -UBE: level 2, 2' forward 2' reverse, pace: 9.5   06/13/22 -Manual Therapy: myofascial release and trigger point applied to the RUE at the biceps, subscapularis, axillary region, and trapezius to address pain and fascial restrictions in order to improve ROM. -A/ROM: supine- flexion, protraction, horizontal abduction, er/IR, 10 reps -Proximal shoulder strengthening: supine-paddles, criss cross, circles each direction, 10 reps each, 1 rest break -Wall Wash: flexion 1' -Scapula Strengthening: red theraband, extension, retraction, rows, x10 -A/ROM: standing-flexion, abduction,  protraction, horizontal abduction, er/IR, 10 reps -Proximal shoulder strengthening: standing-paddles, criss cross, circles each direction, 10 reps each, 1 rest break -UBE: level 1, 2' forward 2' reverse, pace: 6.0    PATIENT EDUCATION: Education details:  Person educated: Patient Education method: Consulting civil engineer, Demonstration, and Handouts Education comprehension: verbalized understanding and returned demonstration  HOME EXERCISE PROGRAM: 1/23: Table Slides 1/26: Thumb tacs 2/7: AA/ROM 2/20: Isometrics and Wall Slides 2/27: Stretches (er and biceps) 3/1: A/ROM 3/19: red scapular theraband; practice functional reaching  GOALS: Goals reviewed with patient? Yes  SHORT TERM GOALS: Target date: 05/31/22  Pt will decrease fascial restrictions to minimal amounts or less in the RUE, in order to improve mobility required for overhead reaching tasks.  Goal status: IN PROGRESS  2.  Pt will improve P/ROM to [facility] in RUE to improve ability to perform dressing tasks with minimal compensatory techniques.  Goal status: MET  3.  Pt will improve strength to 4/5 in RUE to improve ability to assist LUE with lifting objects needed during dressing, bathing, and grooming tasks. Goal status: IN PROGRESS   LONG TERM GOALS: Target date: 07/05/22  Pt will be provided and educated on HEP to improve mobility in RUE required for ADL completion.  Goal status: IN PROGRESS  2.  Pt will decrease pain in RUE to 3/10 or less in order to sleep for 3+ consecutive hours without waking due to pain.  Goal status: IN PROGRESS  3.  Pt will increase A/ROM of RUE to [facility] to improve ability to reach overhead and behind back during dressing and bathing tasks.  Goal status: IN PROGRESS  4.  Pt will increase strength in RUE to 5/5 to improve ability to perform lifting tasks during cooking and cleaning, with no compensatory techniques.  Goal status: IN PROGRESS   ASSESSMENT:  CLINICAL IMPRESSION: Pt reports soreness  after previous session, HEP is going well. Continued with manual techniques, focusing at anterior shoulder and scapular regions. Added sidelying A/ROM today, pt with improved ease compared to standing position, adjustments made as needed with improvement in discomfort. Pt with improved form and ease of movement with standing A/ROM, less momentum used. Full ROM during abduction, approximately 50-60% in flexion due to strength deficits.  Continued with scapular theraband and added functional reaching task. Pt is improving with both ROM and strength compared to previous session. Verbal cuing for form and technique.   PERFORMANCE DEFICITS: in functional skills including ADLs, IADLs, ROM, strength, pain, fascial restrictions, Gross motor control, body mechanics, and UE functional use.   PLAN:  OT FREQUENCY: 2x/week  OT DURATION: 8 weeks  PLANNED INTERVENTIONS: self care/ADL training, therapeutic exercise, therapeutic activity, manual therapy, scar mobilization, passive range of motion, functional mobility training, electrical stimulation, ultrasound, moist heat, cryotherapy, patient/family education, coping strategies training, and DME and/or AE instructions  CONSULTED AND AGREED WITH PLAN OF CARE: Patient  PLAN FOR NEXT SESSION: manual therapy, A/ROM in standing, proximal shoulder strengthening, add functional reaching into/out of cabinets   Guadelupe Sabin, OTR/L  [phone] 06/28/2022, 9:02 AM

## 2022-07-03 ENCOUNTER — Ambulatory Visit (HOSPITAL_COMMUNITY): Payer: BC Managed Care – PPO | Admitting: Occupational Therapy

## 2022-07-03 ENCOUNTER — Encounter (HOSPITAL_COMMUNITY): Payer: Self-pay | Admitting: Occupational Therapy

## 2022-07-03 DIAGNOSIS — M25611 Stiffness of right shoulder, not elsewhere classified: Secondary | ICD-10-CM | POA: Diagnosis not present

## 2022-07-03 DIAGNOSIS — R29898 Other symptoms and signs involving the musculoskeletal system: Secondary | ICD-10-CM

## 2022-07-03 DIAGNOSIS — M25511 Pain in right shoulder: Secondary | ICD-10-CM | POA: Diagnosis not present

## 2022-07-03 NOTE — Therapy (Signed)
OUTPATIENT OCCUPATIONAL THERAPY ORTHO TREATMENT NOTE  Patient Name: Riley Larson MRN: [id number] DOB:December 19, 1970, 52 y.o., male Today's Date: 07/03/2022  PCP: Sharilyn Sites, MD REFERRING PROVIDER: Earlie Server, MD  END OF SESSION:  OT End of Session - 07/03/22 0953     Visit Number 12    Number of Visits 17    Date for OT Re-Evaluation 07/05/22    Authorization Type BCBS    OT Start Time 0950    OT Stop Time 1030    OT Time Calculation (min) 40 min    Activity Tolerance Patient tolerated treatment well    Behavior During Therapy [facility] for tasks assessed/performed             Past Medical History:  Diagnosis Date   Arthritis    Hypertension    Past Surgical History:  Procedure Laterality Date   BACK SURGERY     COLONOSCOPY WITH PROPOFOL N/A 03/14/2020   Procedure: COLONOSCOPY WITH PROPOFOL;  Surgeon: Harvel Quale, MD;  [facility]: [facility];  Service: Gastroenterology;  Laterality: N/A;  9:15   LAPAROSCOPIC GASTRIC BANDING     left index finger surgery      TOTAL KNEE ARTHROPLASTY Right 03/09/2021   Procedure: TOTAL KNEE ARTHROPLASTY;  Surgeon: Earlie Server, MD;  Location: [facility];  Service: Orthopedics;  Laterality: Right;   TOTAL KNEE ARTHROPLASTY Left 06/01/2021   Procedure: TOTAL KNEE ARTHROPLASTY;  Surgeon: Earlie Server, MD;  Location: [facility];  Service: Orthopedics;  Laterality: Left;   Patient Active Problem List   Diagnosis Date Noted   CELLULITIS, LEFT HAND 10/16/2009    ONSET DATE: 04/05/22  REFERRING DIAG: R shoulder Arthoplasty and rortator cuff repair  THERAPY DIAG:  Acute pain of right shoulder  Shoulder stiffness, right  Other symptoms and signs involving the musculoskeletal system  Rationale for Evaluation and Treatment: Rehabilitation  SUBJECTIVE:   SUBJECTIVE STATEMENT: "Man I just want to be able to reach up"  PERTINENT HISTORY: In 2023 pt was having a lot of pain with movements in his R shoulder. He tore his  rotator cuff pulling himself up off the ground. Pt had 3 injections prior to deciding on surgery.   PRECAUTIONS: Shoulder  WEIGHT BEARING RESTRICTIONS: Yes No weight at this time  PAIN:  Are you having pain? Yes: NPRS scale: 5/10 Pain location: trapezius and anterior shoulder girdle Pain description: tight, aching Aggravating factors: movment Relieving factors: supported pressure relief, medication  FALLS: Has patient fallen in last 6 months? No  PATIENT GOALS: To get back to normal  NEXT MD VISIT: None scheduled  OBJECTIVE:   HAND DOMINANCE: Right  ADLs: Overall ADLs: Pt requiring increased time and up to min assist for dressing, bathing and grooming. All IADL's, pt has his wife assist/complete for him at this time.   FUNCTIONAL OUTCOME MEASURES: FOTO: 28.54 06/13/22: 55/100  UPPER EXTREMITY ROM:       Assessed supine, er/IR adducted  Passive ROM Right Eval Supine Right 06/13/22  Shoulder flexion 86 155  Shoulder abduction 101 160  Shoulder internal rotation 90 90  Shoulder external rotation 12 78  (Blank rows = not tested)   Assessed seated, er/IR adducted  Active ROM  Right 06/13/22  Shoulder flexion  98  Shoulder abduction  77  Shoulder internal rotation  90  Shoulder external rotation  41  (Blank rows = not tested)  UPPER EXTREMITY MMT:     MMT Right eval Right 06/13/22  Shoulder flexion    Shoulder  abduction    Shoulder adduction    Shoulder extension    Shoulder internal rotation    Shoulder external rotation    Elbow flexion    Elbow extension    (Blank rows = not tested)   OBSERVATIONS: Severe fascial restriction noted in the trapezius, biceps, anterior shoulder girdle, scapula, and subscapularis   TODAY'S TREATMENT:                                                                                                                              DATE:  07/03/22 -Manual Therapy: myofascial release and trigger point applied to the RUE at the  biceps, subscapularis, axillary region, and trapezius to address pain and fascial restrictions in order to improve ROM. -A/ROM: seated, flexion, abduction, protraction, horizontal abduction, er/IR, x10 -Wall Slides: flexion, abduction, x10 -Shoulder Strengthening: 2lb dumbbell, flexion, abduction, protraction, horizontal abduction, er/IR, x10 -Proximal shoulder exercises: arms on fire x60", ABC's with 1lb dumbbell x1 -Scapular Strengthening: red theraband, extension, retraction, rows, x10 -Functional Reaching: 1lb wrist weight donned, 10 items from shoulder height shelf, 6 items from overhead shelf, tapped the top shelf x10  06/28/22 -Manual Therapy: myofascial release and trigger point applied to the RUE at the biceps, subscapularis, axillary region, and trapezius to address pain and fascial restrictions in order to improve ROM. -A/ROM: sidelying-abduction, flexion, protraction, horizontal abduction, er/IR, 10 reps -A/ROM: standing-abduction, flexion, protraction, horizontal abduction, er/IR, 10 reps -Proximal shoulder strengthening: standing-paddles, criss cross, circles each direction, 10 reps each, 1 rest break -Scapular theraband: red-row, extension, retraction, 10 reps  -Overhead lacing: seated, lacing from top down and then reversing -Functional Reaching: pt placing 10 cones onto top shelf of overhead cabinet in flexion, removing in abduction  06/25/22 -Manual Therapy: myofascial release and trigger point applied to the RUE at the biceps, subscapularis, axillary region, and trapezius to address pain and fascial restrictions in order to improve ROM. -A/ROM: standing-abduction, flexion, protraction, horizontal abduction, er/IR, 10 reps -AA/ROM: flexion beginning and ending at 90 degrees flexion, 10 reps -Proximal shoulder strengthening: standing-paddles, criss cross, circles each direction, 10 reps each, 1 rest break -Scapular theraband: red-row, extension, retraction, 10 reps  -Proximal  shoulder strengthening on door with washcloth at 90 degrees flexion, 1'  -Overhead lacing: seated, lacing from top down and then reversing -UBE: level 2, 2' forward 2' reverse, pace: 9.5    PATIENT EDUCATION: Education details: Manufacturing engineer Person educated: Patient Education method: Explanation, Demonstration, and Handouts Education comprehension: verbalized understanding and returned demonstration  HOME EXERCISE PROGRAM: 1/23: Table Slides 1/26: Thumb tacs 2/7: AA/ROM 2/20: Isometrics and Wall Slides 2/27: Stretches (er and biceps) 3/1: A/ROM 3/19: red scapular theraband; practice functional reaching 3/27: shoulder strengthening with dumbbells  GOALS: Goals reviewed with patient? Yes  SHORT TERM GOALS: Target date: 05/31/22  Pt will decrease fascial restrictions to minimal amounts or less in the RUE, in order to improve mobility required for overhead reaching tasks.  Goal status: IN PROGRESS  2.  Pt will improve P/ROM to [facility] in RUE to improve ability to perform dressing tasks with minimal compensatory techniques.  Goal status: MET  3.  Pt will improve strength to 4/5 in RUE to improve ability to assist LUE with lifting objects needed during dressing, bathing, and grooming tasks. Goal status: IN PROGRESS   LONG TERM GOALS: Target date: 07/05/22  Pt will be provided and educated on HEP to improve mobility in RUE required for ADL completion.  Goal status: IN PROGRESS  2.  Pt will decrease pain in RUE to 3/10 or less in order to sleep for 3+ consecutive hours without waking due to pain.  Goal status: IN PROGRESS  3.  Pt will increase A/ROM of RUE to [facility] to improve ability to reach overhead and behind back during dressing and bathing tasks.  Goal status: IN PROGRESS  4.  Pt will increase strength in RUE to 5/5 to improve ability to perform lifting tasks during cooking and cleaning, with no compensatory techniques.  Goal status: IN  PROGRESS   ASSESSMENT:  CLINICAL IMPRESSION: Pt continuing to report increased pain and limited ROM. This session he worked on functional reaching, shoulder strengthening, and scapular strengthening with endurance proximal exercises as well. He is able to achieve 80% of a full ROM and tolerating 1-2lb dumbbells during shoulder strengthening. He continues to have the most difficulty with abduction, reporting sharp pain and catching sensation around 90 degrees. With functional reaching, he reports he feels a catch at 90 degrees but is then able to work through it and reach a little higher. OT providing verbal and tactile cuing throughout session for positioning and technique.   PERFORMANCE DEFICITS: in functional skills including ADLs, IADLs, ROM, strength, pain, fascial restrictions, Gross motor control, body mechanics, and UE functional use.   PLAN:  OT FREQUENCY: 2x/week  OT DURATION: 8 weeks  PLANNED INTERVENTIONS: self care/ADL training, therapeutic exercise, therapeutic activity, manual therapy, scar mobilization, passive range of motion, functional mobility training, electrical stimulation, ultrasound, moist heat, cryotherapy, patient/family education, coping strategies training, and DME and/or AE instructions  CONSULTED AND AGREED WITH PLAN OF CARE: Patient  PLAN FOR NEXT SESSION: manual therapy, A/ROM in standing, proximal shoulder strengthening, functional reaching into/out of cabinets, add loop band exercises and therapy ball exercises,    Paulita Fujita, OTR/L [phone] 07/03/2022, 9:53 AM

## 2022-07-05 ENCOUNTER — Encounter (HOSPITAL_COMMUNITY): Payer: Self-pay | Admitting: Occupational Therapy

## 2022-07-05 ENCOUNTER — Ambulatory Visit (HOSPITAL_COMMUNITY): Payer: BC Managed Care – PPO | Admitting: Occupational Therapy

## 2022-07-05 DIAGNOSIS — M25611 Stiffness of right shoulder, not elsewhere classified: Secondary | ICD-10-CM | POA: Diagnosis not present

## 2022-07-05 DIAGNOSIS — R29898 Other symptoms and signs involving the musculoskeletal system: Secondary | ICD-10-CM | POA: Diagnosis not present

## 2022-07-05 DIAGNOSIS — M25511 Pain in right shoulder: Secondary | ICD-10-CM | POA: Diagnosis not present

## 2022-07-05 NOTE — Therapy (Unsigned)
OUTPATIENT OCCUPATIONAL THERAPY ORTHO TREATMENT NOTE  Patient Name: Riley Larson MRN: [id number] DOB:[birth year], 52 y.o., male Today's Date: 07/05/2022  PCP: Sharilyn Sites, MD REFERRING PROVIDER: Earlie Server, MD  Progress Note Reporting Period 04/30/22 to 07/05/22  See note below for Objective Data and Assessment of Progress/Goals.    END OF SESSION:  OT End of Session - 07/05/22 0910     Visit Number 13    Number of Visits 17    Date for OT Re-Evaluation 08/23/22    Authorization Type [employer]    OT Start Time 0907    OT Stop Time 0947    OT Time Calculation (min) 40 min    Activity Tolerance Patient tolerated treatment well    Behavior During Therapy WFL for tasks assessed/performed             Past Medical History:  Diagnosis Date   Arthritis    Hypertension    Past Surgical History:  Procedure Laterality Date   BACK SURGERY     COLONOSCOPY WITH PROPOFOL N/A 03/14/2020   Procedure: COLONOSCOPY WITH PROPOFOL;  Surgeon: Harvel Quale, MD;  Location: [facility];  Service: Gastroenterology;  Laterality: N/A;  9:15   LAPAROSCOPIC GASTRIC BANDING     left index finger surgery      TOTAL KNEE ARTHROPLASTY Right 03/09/2021   Procedure: TOTAL KNEE ARTHROPLASTY;  Surgeon: Earlie Server, MD;  Location: [facility];  Service: Orthopedics;  Laterality: Right;   TOTAL KNEE ARTHROPLASTY Left 06/01/2021   Procedure: TOTAL KNEE ARTHROPLASTY;  Surgeon: Earlie Server, MD;  Location: [facility];  Service: Orthopedics;  Laterality: Left;   Patient Active Problem List   Diagnosis Date Noted   CELLULITIS, LEFT HAND 10/16/2009    ONSET DATE: 04/05/22  REFERRING DIAG: R shoulder Arthoplasty and rortator cuff repair  THERAPY DIAG:  Acute pain of right shoulder  Shoulder stiffness, right  Other symptoms and signs involving the musculoskeletal system  Rationale for Evaluation and Treatment: Rehabilitation  SUBJECTIVE:   SUBJECTIVE STATEMENT: "Man I just want to  be able to reach up"  PERTINENT HISTORY: In 2023 pt was having a lot of pain with movements in his R shoulder. He tore his rotator cuff pulling himself up off the ground. Pt had 3 injections prior to deciding on surgery.   PRECAUTIONS: Shoulder  WEIGHT BEARING RESTRICTIONS: Yes No weight at this time  PAIN:  Are you having pain? Yes: NPRS scale: 5/10 Pain location: trapezius and anterior shoulder girdle Pain description: tight, aching Aggravating factors: movment Relieving factors: supported pressure relief, medication  FALLS: Has patient fallen in last 6 months? No  PATIENT GOALS: To get back to normal  NEXT MD VISIT: 07/16/22  OBJECTIVE:   HAND DOMINANCE: Right  ADLs: Overall ADLs: Pt requiring increased time and up to min assist for dressing, bathing and grooming. All IADL's, pt has his wife assist/complete for him at this time.   FUNCTIONAL OUTCOME MEASURES: FOTO: 28.54 06/13/22: 55/100 07/05/22: 61.12/100  UPPER EXTREMITY ROM:       Assessed supine, er/IR adducted  Passive ROM Right Eval Supine Right 06/13/22  Shoulder flexion 86 155  Shoulder abduction 101 160  Shoulder internal rotation 90 90  Shoulder external rotation 12 78  (Blank rows = not tested)   Assessed seated, er/IR adducted  Active ROM  Right 06/13/22 Right 07/05/22  Shoulder flexion  98 146  Shoulder abduction  77 156  Shoulder internal rotation  90 90  Shoulder external rotation  41 44  (Blank rows = not tested)  UPPER EXTREMITY MMT:     MMT Right eval Right 06/13/22 Right 07/05/22  Shoulder flexion   3/5  Shoulder abduction   3/5  Shoulder adduction   5/5  Shoulder extension   4+/5  Shoulder internal rotation   4+/5  Shoulder external rotation   4-/5  Elbow flexion   4+/5  Elbow extension   4-/5  (Blank rows = not tested)   OBSERVATIONS: Severe fascial restriction noted in the trapezius, biceps, anterior shoulder girdle, scapula, and subscapularis   TODAY'S TREATMENT:                                                                                                                               DATE:  07/05/22 -A/ROM: seated, flexion, abduction, protraction, horizontal abduction, er/IR, x12 -Wall Slides: flexion, abduction, x10 -Measurements for Reassessment -Loop Band: wall taps, V ups, wall clocks, green band, x10 -Overhead lacing: seated, lacing from top down and then reversing, x2 -Scapular Strengthening: green theraband, extension, retraction, rows, x10  07/03/22 -Manual Therapy: myofascial release and trigger point applied to the RUE at the biceps, subscapularis, axillary region, and trapezius to address pain and fascial restrictions in order to improve ROM. -A/ROM: seated, flexion, abduction, protraction, horizontal abduction, er/IR, x10 -Wall Slides: flexion, abduction, x10 -Shoulder Strengthening: 2lb dumbbell, flexion, abduction, protraction, horizontal abduction, er/IR, x10 -Proximal shoulder exercises: arms on fire x60", ABC's with 1lb dumbbell x1 -Scapular Strengthening: red theraband, extension, retraction, rows, x10 -Functional Reaching: 1lb wrist weight donned, 10 items from shoulder height shelf, 6 items from overhead shelf, tapped the top shelf x10  06/28/22 -Manual Therapy: myofascial release and trigger point applied to the RUE at the biceps, subscapularis, axillary region, and trapezius to address pain and fascial restrictions in order to improve ROM. -A/ROM: sidelying-abduction, flexion, protraction, horizontal abduction, er/IR, 10 reps -A/ROM: standing-abduction, flexion, protraction, horizontal abduction, er/IR, 10 reps -Proximal shoulder strengthening: standing-paddles, criss cross, circles each direction, 10 reps each, 1 rest break -Scapular theraband: red-row, extension, retraction, 10 reps  -Overhead lacing: seated, lacing from top down and then reversing -Functional Reaching: pt placing 10 cones onto top shelf of overhead cabinet in flexion,  removing in abduction    PATIENT EDUCATION: Education details: Loop Band Exercises Person educated: Patient Education method: Explanation, Demonstration, and Handouts Education comprehension: verbalized understanding and returned demonstration  HOME EXERCISE PROGRAM: 1/23: Table Slides 1/26: Thumb tacs 2/7: AA/ROM 2/20: Isometrics and Wall Slides 2/27: Stretches (er and biceps) 3/1: A/ROM 3/19: red scapular theraband; practice functional reaching 3/27: shoulder strengthening with dumbbells 3/29: Loop Band Exercises  GOALS: Goals reviewed with patient? Yes  SHORT TERM GOALS: Target date: 05/31/22  Pt will decrease fascial restrictions to minimal amounts or less in the RUE, in order to improve mobility required for overhead reaching tasks.  Goal status: IN PROGRESS  2.  Pt will improve P/ROM to [facility] in RUE to improve ability to  perform dressing tasks with minimal compensatory techniques.  Goal status: MET  3.  Pt will improve strength to 4/5 in RUE to improve ability to assist LUE with lifting objects needed during dressing, bathing, and grooming tasks. Goal status: IN PROGRESS   LONG TERM GOALS: Target date: 07/05/22  Pt will be provided and educated on HEP to improve mobility in RUE required for ADL completion.  Goal status: IN PROGRESS  2.  Pt will decrease pain in RUE to 3/10 or less in order to sleep for 3+ consecutive hours without waking due to pain.  Goal status: IN PROGRESS  3.  Pt will increase A/ROM of RUE to [facility] to improve ability to reach overhead and behind back during dressing and bathing tasks.  Goal status: IN PROGRESS  4.  Pt will increase strength in RUE to 5/5 to improve ability to perform lifting tasks during cooking and cleaning, with no compensatory techniques.  Goal status: IN PROGRESS   ASSESSMENT:  CLINICAL IMPRESSION: This session pt completed a reassessment where he is demonstrating good improvement with all A/ROM and strength. His pain is  slowly decreasing, however he continues to have a catching feeling with flexion and abduction. After completing his measurements, OT added loop band exercises and increased pt to green bands for his scapular strengthening. He reports some fatigue this session, in which he required rest breaks after each set of exercises, as well as during lacing x2 due to muscle fatigue. Verbal and tactile cuing provided for positioning and technique throughout session.   PERFORMANCE DEFICITS: in functional skills including ADLs, IADLs, ROM, strength, pain, fascial restrictions, Gross motor control, body mechanics, and UE functional use.   PLAN:  OT FREQUENCY: 2x/week  OT DURATION: 8 weeks  PLANNED INTERVENTIONS: self care/ADL training, therapeutic exercise, therapeutic activity, manual therapy, scar mobilization, passive range of motion, functional mobility training, electrical stimulation, ultrasound, moist heat, cryotherapy, patient/family education, coping strategies training, and DME and/or AE instructions  CONSULTED AND AGREED WITH PLAN OF CARE: Patient  PLAN FOR NEXT SESSION: manual therapy, A/ROM in standing, proximal shoulder strengthening, functional reaching into/out of cabinets, add loop band exercises and therapy ball exercises,    Paulita Fujita, OTR/L [phone] 07/05/2022, 9:11 AM

## 2022-07-05 NOTE — Patient Instructions (Signed)
Complete _______ repetitions each. Complete _______ time a day.  Wall taps with theraband  With a looped elastic band around forearms/wrists and arms at a 90 angle pressed against the wall. Keeping elbows on the wall, tap right arm out to the right. Hold for 1 second. Return right arm back to neutral. Repeat with left arm.    Wall V slides with Theraband  Place a band loop around hands/forearms and face a wall. Extend both arms diagonally into a V shape on the wall. Hold this stretch for specified amount of time. Lower arms slowly back into neutral position. Repeat.       scap clocks  Tie a loop with a theraband and place around your wrists.  Stand with a wall in front of you.  Picture a clock in front of you.  Place both palms on the wall, arms straight.  While keeping the left/right hand planted, use the right/left hand to pull away and tap each number (1, 3, 5- right OR 11,9,7 left), coming back to center each time.    

## 2022-07-09 ENCOUNTER — Encounter (HOSPITAL_COMMUNITY): Payer: Self-pay | Admitting: Occupational Therapy

## 2022-07-09 ENCOUNTER — Ambulatory Visit (HOSPITAL_COMMUNITY): Payer: BC Managed Care – PPO | Attending: Orthopedic Surgery | Admitting: Occupational Therapy

## 2022-07-09 DIAGNOSIS — M25611 Stiffness of right shoulder, not elsewhere classified: Secondary | ICD-10-CM | POA: Insufficient documentation

## 2022-07-09 DIAGNOSIS — R29898 Other symptoms and signs involving the musculoskeletal system: Secondary | ICD-10-CM | POA: Diagnosis not present

## 2022-07-09 DIAGNOSIS — M25511 Pain in right shoulder: Secondary | ICD-10-CM | POA: Insufficient documentation

## 2022-07-09 NOTE — Therapy (Signed)
OUTPATIENT OCCUPATIONAL THERAPY ORTHO TREATMENT NOTE  Patient Name: Riley Larson MRN: [id number] DOB:09/16/1970, 52 y.o., male Today's Date: 07/09/2022  PCP: Sharilyn Sites, MD REFERRING PROVIDER: Earlie Server, MD   END OF SESSION:  OT End of Session - 07/09/22 0903     Visit Number 14    Number of Visits 17    Date for OT Re-Evaluation 08/23/22    Authorization Type [employer]    Progress Note Due on Visit 10    OT Start Time 0905    OT Stop Time 0945    OT Time Calculation (min) 40 min    Activity Tolerance Patient tolerated treatment well    Behavior During Therapy [facility] for tasks assessed/performed             Past Medical History:  Diagnosis Date   Arthritis    Hypertension    Past Surgical History:  Procedure Laterality Date   BACK SURGERY     COLONOSCOPY WITH PROPOFOL N/A 03/14/2020   Procedure: COLONOSCOPY WITH PROPOFOL;  Surgeon: Harvel Quale, MD;  Location: [facility];  Service: Gastroenterology;  Laterality: N/A;  9:15   LAPAROSCOPIC GASTRIC BANDING     left index finger surgery      TOTAL KNEE ARTHROPLASTY Right 03/09/2021   Procedure: TOTAL KNEE ARTHROPLASTY;  Surgeon: Earlie Server, MD;  Location: [facility];  Service: Orthopedics;  Laterality: Right;   TOTAL KNEE ARTHROPLASTY Left 06/01/2021   Procedure: TOTAL KNEE ARTHROPLASTY;  Surgeon: Earlie Server, MD;  Location: [facility];  Service: Orthopedics;  Laterality: Left;   Patient Active Problem List   Diagnosis Date Noted   CELLULITIS, LEFT HAND 10/16/2009    ONSET DATE: 04/05/22  REFERRING DIAG: R shoulder Arthoplasty and rortator cuff repair  THERAPY DIAG:  Acute pain of right shoulder  Shoulder stiffness, right  Other symptoms and signs involving the musculoskeletal system  Rationale for Evaluation and Treatment: Rehabilitation  SUBJECTIVE:   SUBJECTIVE STATEMENT: "I woke up and my arm was killing me."  PERTINENT HISTORY: In 2023 pt was having a lot of pain with movements  in his R shoulder. He tore his rotator cuff pulling himself up off the ground. Pt had 3 injections prior to deciding on surgery.   PRECAUTIONS: Shoulder  WEIGHT BEARING RESTRICTIONS: Yes No weight at this time  PAIN:  Are you having pain? Yes: NPRS scale: 9/10 Pain location: trapezius and anterior shoulder girdle Pain description: tight, aching Aggravating factors: movment Relieving factors: supported pressure relief, medication  FALLS: Has patient fallen in last 6 months? No  PATIENT GOALS: To get back to normal  NEXT MD VISIT: 07/16/22  OBJECTIVE:   HAND DOMINANCE: Right  ADLs: Overall ADLs: Pt requiring increased time and up to min assist for dressing, bathing and grooming. All IADL's, pt has his wife assist/complete for him at this time.   FUNCTIONAL OUTCOME MEASURES: FOTO: 28.54 06/13/22: 55/100 07/05/22: 61.12/100  UPPER EXTREMITY ROM:       Assessed supine, er/IR adducted  Passive ROM Right Eval Supine Right 06/13/22  Shoulder flexion 86 155  Shoulder abduction 101 160  Shoulder internal rotation 90 90  Shoulder external rotation 12 78  (Blank rows = not tested)   Assessed seated, er/IR adducted  Active ROM  Right 06/13/22 Right 07/05/22  Shoulder flexion  98 146  Shoulder abduction  77 156  Shoulder internal rotation  90 90  Shoulder external rotation  41 44  (Blank rows = not tested)  UPPER EXTREMITY MMT:  MMT Right eval Right 06/13/22 Right 07/05/22  Shoulder flexion   3/5  Shoulder abduction   3/5  Shoulder adduction   5/5  Shoulder extension   4+/5  Shoulder internal rotation   4+/5  Shoulder external rotation   4-/5  Elbow flexion   4+/5  Elbow extension   4-/5  (Blank rows = not tested)   OBSERVATIONS: Severe fascial restriction noted in the trapezius, biceps, anterior shoulder girdle, scapula, and subscapularis   TODAY'S TREATMENT:                                                                                                                               DATE:  07/09/22 -A/ROM: seated, flexion, abduction, protraction, horizontal abduction, er/IR, x12 -Stretches: flexion stretch, doorway er stretch, bicep stretch, latissimus stretch, 5x15" -Shoulder strengthening: red theraband, flexion, abduction, horizontal abduction, protraction, er, IR, x10 -Ball Rolls on the Wall: flexion, abduction, x10 -UBE: level 2, 3.0+ RPM, 2' forwards and backwards  07/05/22 -A/ROM: seated, flexion, abduction, protraction, horizontal abduction, er/IR, x12 -Wall Slides: flexion, abduction, x10 -Measurements for Reassessment -Loop Band: wall taps, V ups, wall clocks, green band, x10 -Overhead lacing: seated, lacing from top down and then reversing, x2 -Scapular Strengthening: green theraband, extension, retraction, rows, x10  07/03/22 -Manual Therapy: myofascial release and trigger point applied to the RUE at the biceps, subscapularis, axillary region, and trapezius to address pain and fascial restrictions in order to improve ROM. -A/ROM: seated, flexion, abduction, protraction, horizontal abduction, er/IR, x10 -Wall Slides: flexion, abduction, x10 -Shoulder Strengthening: 2lb dumbbell, flexion, abduction, protraction, horizontal abduction, er/IR, x10 -Proximal shoulder exercises: arms on fire x60", ABC's with 1lb dumbbell x1 -Scapular Strengthening: red theraband, extension, retraction, rows, x10 -Functional Reaching: 1lb wrist weight donned, 10 items from shoulder height shelf, 6 items from overhead shelf, tapped the top shelf x10    PATIENT EDUCATION: Education details: Shoulder strengthening with theraband Person educated: Patient Education method: Explanation, Demonstration, and Handouts Education comprehension: verbalized understanding and returned demonstration  HOME EXERCISE PROGRAM: 1/23: Table Slides 1/26: Thumb tacs 2/7: AA/ROM 2/20: Isometrics and Wall Slides 2/27: Stretches (er and biceps) 3/1: A/ROM 3/19: red scapular  theraband; practice functional reaching 3/27: shoulder strengthening with dumbbells 3/29: Loop Band Exercises 4/2: Shoulder strengthening with theraband  GOALS: Goals reviewed with patient? Yes  SHORT TERM GOALS: Target date: 05/31/22  Pt will decrease fascial restrictions to minimal amounts or less in the RUE, in order to improve mobility required for overhead reaching tasks.  Goal status: IN PROGRESS  2.  Pt will improve P/ROM to [facility] in RUE to improve ability to perform dressing tasks with minimal compensatory techniques.  Goal status: MET  3.  Pt will improve strength to 4/5 in RUE to improve ability to assist LUE with lifting objects needed during dressing, bathing, and grooming tasks. Goal status: IN PROGRESS   LONG TERM GOALS: Target date: 07/05/22  Pt will be provided and educated on HEP  to improve mobility in RUE required for ADL completion.  Goal status: IN PROGRESS  2.  Pt will decrease pain in RUE to 3/10 or less in order to sleep for 3+ consecutive hours without waking due to pain.  Goal status: IN PROGRESS  3.  Pt will increase A/ROM of RUE to [facility] to improve ability to reach overhead and behind back during dressing and bathing tasks.  Goal status: IN PROGRESS  4.  Pt will increase strength in RUE to 5/5 to improve ability to perform lifting tasks during cooking and cleaning, with no compensatory techniques.  Goal status: IN PROGRESS   ASSESSMENT:  CLINICAL IMPRESSION: Pt reporting severe pain, popping, and crunching this session within his shoulder joint. Yesterday he reported pressure washing his boat, however he did not think he was lifting or moving his arm much. OT focused this session on stretching, ROM, and resistance band strengthening. He continues to demonstrate increased weakness with all strengthening, as he had difficulty pulling against the resistance band. Pt tolerated all stretches well and his ROM is 80%+ of full ROM despite 9/10 pain. OT providing  verbal and tactile cuing throughout session for positioning and technique, as well as to decrease shoulder hiking with exercises.   PERFORMANCE DEFICITS: in functional skills including ADLs, IADLs, ROM, strength, pain, fascial restrictions, Gross motor control, body mechanics, and UE functional use.   PLAN:  OT FREQUENCY: 2x/week  OT DURATION: 8 weeks  PLANNED INTERVENTIONS: self care/ADL training, therapeutic exercise, therapeutic activity, manual therapy, scar mobilization, passive range of motion, functional mobility training, electrical stimulation, ultrasound, moist heat, cryotherapy, patient/family education, coping strategies training, and DME and/or AE instructions  CONSULTED AND AGREED WITH PLAN OF CARE: Patient  PLAN FOR NEXT SESSION: manual therapy, A/ROM in standing, proximal shoulder strengthening, functional reaching into/out of cabinets, add loop band exercises and therapy ball exercises,    Paulita Fujita, OTR/L [phone] 07/09/2022, 9:04 AM

## 2022-07-09 NOTE — Patient Instructions (Signed)

## 2022-07-12 ENCOUNTER — Encounter (HOSPITAL_COMMUNITY): Payer: Self-pay | Admitting: Occupational Therapy

## 2022-07-15 ENCOUNTER — Encounter (HOSPITAL_COMMUNITY): Payer: Self-pay | Admitting: Occupational Therapy

## 2022-07-15 ENCOUNTER — Ambulatory Visit (HOSPITAL_COMMUNITY): Payer: BC Managed Care – PPO | Admitting: Occupational Therapy

## 2022-07-15 DIAGNOSIS — M25611 Stiffness of right shoulder, not elsewhere classified: Secondary | ICD-10-CM | POA: Diagnosis not present

## 2022-07-15 DIAGNOSIS — R29898 Other symptoms and signs involving the musculoskeletal system: Secondary | ICD-10-CM

## 2022-07-15 DIAGNOSIS — M25511 Pain in right shoulder: Secondary | ICD-10-CM

## 2022-07-15 NOTE — Patient Instructions (Signed)

## 2022-07-15 NOTE — Therapy (Unsigned)
OUTPATIENT OCCUPATIONAL THERAPY ORTHO TREATMENT NOTE  Patient Name: Riley Larson MRN: [id number] DOB:08/04/1970, 52 y.o., male Today's Date: 07/15/2022  PCP: Assunta Found, MD REFERRING PROVIDER: Frederico Hamman, MD   END OF SESSION:  OT End of Session - 07/15/22 0821     Visit Number 15    Number of Visits 17    Date for OT Re-Evaluation 08/23/22    Authorization Type BC[employer]    Progress Note Due on Visit 10    OT Start Time 0820    OT Stop Time 0900    OT Time Calculation (min) 40 min    Activity Tolerance Patient tolerated treatment well    Behavior During Therapy [facility] for tasks assessed/performed             Past Medical History:  Diagnosis Date   Arthritis    Hypertension    Past Surgical History:  Procedure Laterality Date   BACK SURGERY     COLONOSCOPY WITH PROPOFOL N/A 03/14/2020   Procedure: COLONOSCOPY WITH PROPOFOL;  Surgeon: Dolores Frame, MD;  [facility]: [facility];  Service: Gastroenterology;  Laterality: N/A;  9:15   LAPAROSCOPIC GASTRIC BANDING     left index finger surgery      TOTAL KNEE ARTHROPLASTY Right 03/09/2021   Procedure: TOTAL KNEE ARTHROPLASTY;  Surgeon: Frederico Hamman, MD;  Location: [facility];  Service: Orthopedics;  Laterality: Right;   TOTAL KNEE ARTHROPLASTY Left 06/01/2021   Procedure: TOTAL KNEE ARTHROPLASTY;  Surgeon: Frederico Hamman, MD;  Location: [facility];  Service: Orthopedics;  Laterality: Left;   Patient Active Problem List   Diagnosis Date Noted   CELLULITIS, LEFT HAND 10/16/2009    ONSET DATE: 04/05/22  REFERRING DIAG: R shoulder Arthoplasty and rortator cuff repair  THERAPY DIAG:  Acute pain of right shoulder  Shoulder stiffness, right  Other symptoms and signs involving the musculoskeletal system  Rationale for Evaluation and Treatment: Rehabilitation  SUBJECTIVE:   SUBJECTIVE STATEMENT: "It's not catching the past few day"  PERTINENT HISTORY: In 2023 pt was having a lot of pain with movements in  his R shoulder. He tore his rotator cuff pulling himself up off the ground. Pt had 3 injections prior to deciding on surgery.   PRECAUTIONS: Shoulder  WEIGHT BEARING RESTRICTIONS: Yes No weight at this time  PAIN:  Are you having pain? Yes: NPRS scale: 3/10 Pain location: trapezius and anterior shoulder girdle Pain description: tight, aching Aggravating factors: movment Relieving factors: supported pressure relief, medication  FALLS: Has patient fallen in last 6 months? No  PATIENT GOALS: To get back to normal  NEXT MD VISIT: 07/16/22  OBJECTIVE:   HAND DOMINANCE: Right  ADLs: Overall ADLs: Pt requiring increased time and up to min assist for dressing, bathing and grooming. All IADL's, pt has his wife assist/complete for him at this time.   FUNCTIONAL OUTCOME MEASURES: FOTO: 28.54 06/13/22: 55/100 07/05/22: 61.12/100  UPPER EXTREMITY ROM:       Assessed supine, er/IR adducted  Passive ROM Right Eval Supine Right 06/13/22  Shoulder flexion 86 155  Shoulder abduction 101 160  Shoulder internal rotation 90 90  Shoulder external rotation 12 78  (Blank rows = not tested)   Assessed seated, er/IR adducted  Active ROM  Right 06/13/22 Right 07/05/22  Shoulder flexion  98 146  Shoulder abduction  77 156  Shoulder internal rotation  90 90  Shoulder external rotation  41 44  (Blank rows = not tested)  UPPER EXTREMITY MMT:  MMT Right eval Right 06/13/22 Right 07/05/22  Shoulder flexion   3/5  Shoulder abduction   3/5  Shoulder adduction   5/5  Shoulder extension   4+/5  Shoulder internal rotation   4+/5  Shoulder external rotation   4-/5  Elbow flexion   4+/5  Elbow extension   4-/5  (Blank rows = not tested)   OBSERVATIONS: Severe fascial restriction noted in the trapezius, biceps, anterior shoulder girdle, scapula, and subscapularis   TODAY'S TREATMENT:                                                                                                                               DATE:  07/15/22 -A/ROM: seated, flexion, abduction, protraction, horizontal abduction, er/IR, x10 -Arms on fire x60" -ABC's at 90 degrees flexion  -PNF Strengthening: green band, chest pulls, er pulls, PNF up, PNF down -Functional reaching: 2lb wrist weight donned, 10 items from shoulder height shelf flexion down abduction up, 10 items from overhead shelf flexion down and up -Shoulder strengthening: green theraband, flexion, abduction(green with limited motion, red with full motion), horizontal abduction, protraction, er, IR, x12 -Ball rolls on the wall flexion x10 -Therapy Ball Exercises: flexion, protraction, x10   07/09/22 -A/ROM: seated, flexion, abduction, protraction, horizontal abduction, er/IR, x12 -Stretches: flexion stretch, doorway er stretch, bicep stretch, latissimus stretch, 5x15" -Shoulder strengthening: red theraband, flexion, abduction, horizontal abduction, protraction, er, IR, x10 -[facility]: flexion, abduction, x10 -UBE: level 2, 3.0+ RPM, 2' forwards and backwards  07/05/22 -A/ROM: seated, flexion, abduction, protraction, horizontal abduction, er/IR, x12 -Wall Slides: flexion, abduction, x10 -Measurements for Reassessment -Loop Band: wall taps, V ups, wall clocks, green band, x10 -Overhead lacing: seated, lacing from top down and then reversing, x2 -Scapular Strengthening: green theraband, extension, retraction, rows, x10    PATIENT EDUCATION: Education details: PNF Strengthening Person educated: Patient Education method: Programmer, multimediaxplanation, Facilities managerDemonstration, and Handouts Education comprehension: verbalized understanding and returned demonstration  HOME EXERCISE PROGRAM: 1/23: Table Slides 1/26: Thumb tacs 2/7: AA/ROM 2/20: Isometrics and Wall Slides 2/27: Stretches (er and biceps) 3/1: A/ROM 3/19: red scapular theraband; practice functional reaching 3/27: shoulder strengthening with dumbbells 3/29: Loop Band Exercises 4/2: Shoulder  strengthening with theraband 4/8: PNF Strengthening  GOALS: Goals reviewed with patient? Yes  SHORT TERM GOALS: Target date: 05/31/22  Pt will decrease fascial restrictions to minimal amounts or less in the RUE, in order to improve mobility required for overhead reaching tasks.  Goal status: IN PROGRESS  2.  Pt will [facility] to [facility] to improve ability to perform dressing tasks with minimal compensatory techniques.  Goal status: MET  3.  Pt will improve strength to 4/5 in RUE to improve ability to assist LUE with lifting objects needed during dressing, bathing, and grooming tasks. Goal status: IN PROGRESS   LONG TERM GOALS: Target date: 07/05/22  Pt will be provided and educated on HEP to improve mobility in RUE required for  ADL completion.  Goal status: IN PROGRESS  2.  Pt will decrease pain in RUE to 3/10 or less in order to sleep for 3+ consecutive hours without waking due to pain.  Goal status: IN PROGRESS  3.  Pt will increase A/ROM of RUE to [facility] to improve ability to reach overhead and behind back during dressing and bathing tasks.  Goal status: IN PROGRESS  4.  Pt will increase strength in RUE to 5/5 to improve ability to perform lifting tasks during cooking and cleaning, with no compensatory techniques.  Goal status: IN PROGRESS   ASSESSMENT:  CLINICAL IMPRESSION: This session pt demonstrating improving ROM with minimal pain and no catching sensation with any motions. Additionally his tolerance is improving well with all strengthening and endurance based exercises, as he only required 1 rest break in the entirety of the session. OT added PNF strengthening this session where he was able to achieve the full PNF pattern at a diagonal both up and down. Verbal and tactile cuing provided for postioning and technique throughout session.   PERFORMANCE DEFICITS: in functional skills including ADLs, IADLs, ROM, strength, pain, fascial restrictions, Gross motor control,  body mechanics, and UE functional use.   PLAN:  OT FREQUENCY: 2x/week  OT DURATION: 8 weeks  PLANNED INTERVENTIONS: self care/ADL training, therapeutic exercise, therapeutic activity, manual therapy, scar mobilization, passive range of motion, functional mobility training, electrical stimulation, ultrasound, moist heat, cryotherapy, patient/family education, coping strategies training, and DME and/or AE instructions  CONSULTED AND AGREED WITH PLAN OF CARE: Patient  PLAN FOR NEXT SESSION: manual therapy, A/ROM in standing, proximal shoulder strengthening, functional reaching into/out of cabinets, add loop band exercises and therapy ball exercises,    Trish Mage, OTR/L [phone] 07/15/2022, 8:22 AM

## 2022-07-16 DIAGNOSIS — M19011 Primary osteoarthritis, right shoulder: Secondary | ICD-10-CM | POA: Diagnosis not present

## 2022-07-18 ENCOUNTER — Encounter (HOSPITAL_COMMUNITY): Payer: BC Managed Care – PPO | Admitting: Occupational Therapy

## 2022-07-18 ENCOUNTER — Telehealth (HOSPITAL_COMMUNITY): Payer: Self-pay | Admitting: Occupational Therapy

## 2022-07-18 NOTE — Telephone Encounter (Signed)
This OT called pt regarding his no show on 07/18/22 at 9:45am. Pt did not answer, OT left a HIPAA appropriate VM informing him of the missed visit and when his next appointment was. Additionally OT requested that pt call when he needs to cancel an appointment due to our No Show Policy.   Trish Mage, OTR/L [facility] [phone]

## 2022-07-24 ENCOUNTER — Encounter (HOSPITAL_COMMUNITY): Payer: Self-pay | Admitting: Occupational Therapy

## 2022-07-24 ENCOUNTER — Ambulatory Visit (HOSPITAL_COMMUNITY): Payer: BC Managed Care – PPO | Admitting: Occupational Therapy

## 2022-07-24 DIAGNOSIS — R29898 Other symptoms and signs involving the musculoskeletal system: Secondary | ICD-10-CM | POA: Diagnosis not present

## 2022-07-24 DIAGNOSIS — M25511 Pain in right shoulder: Secondary | ICD-10-CM | POA: Diagnosis not present

## 2022-07-24 DIAGNOSIS — M25611 Stiffness of right shoulder, not elsewhere classified: Secondary | ICD-10-CM | POA: Diagnosis not present

## 2022-07-24 NOTE — Therapy (Signed)
OUTPATIENT OCCUPATIONAL THERAPY ORTHO TREATMENT NOTE  Patient Name: Riley Larson MRN: [id number] DOB:[birth year], 52 y.o., male Today's Date: 07/24/2022  PCP: Assunta Found, MD REFERRING PROVIDER: Frederico Hamman, MD   END OF SESSION:  OT End of Session - 07/24/22 0950     Visit Number 16    Number of Visits 17    Date for OT Re-Evaluation 08/23/22    Authorization Type BCBS    Progress Note Due on Visit 10    OT Start Time [phone]    OT Stop Time 1028    OT Time Calculation (min) 40 min    Activity Tolerance Patient tolerated treatment well    Behavior During Therapy [facility] for tasks assessed/performed             Past Medical History:  Diagnosis Date   Arthritis    Hypertension    Past Surgical History:  Procedure Laterality Date   BACK SURGERY     COLONOSCOPY WITH PROPOFOL N/A 03/14/2020   Procedure: COLONOSCOPY WITH PROPOFOL;  Surgeon: Dolores Frame, MD;  Location: [facility];  Service: Gastroenterology;  Laterality: N/A;  9:15   LAPAROSCOPIC GASTRIC BANDING     left index finger surgery      TOTAL KNEE ARTHROPLASTY Right 03/09/2021   Procedure: TOTAL KNEE ARTHROPLASTY;  Surgeon: Frederico Hamman, MD;  Location: [facility];  Service: Orthopedics;  Laterality: Right;   TOTAL KNEE ARTHROPLASTY Left 06/01/2021   Procedure: TOTAL KNEE ARTHROPLASTY;  Surgeon: Frederico Hamman, MD;  Location: [facility];  Service: Orthopedics;  Laterality: Left;   Patient Active Problem List   Diagnosis Date Noted   CELLULITIS, LEFT HAND 10/16/2009    ONSET DATE: 04/05/22  REFERRING DIAG: R shoulder Arthoplasty and rortator cuff repair  THERAPY DIAG:  Acute pain of right shoulder  Shoulder stiffness, right  Other symptoms and signs involving the musculoskeletal system  Rationale for Evaluation and Treatment: Rehabilitation  SUBJECTIVE:   SUBJECTIVE STATEMENT: "I feel like I'm making a lot of improvement"  PERTINENT HISTORY: In 2023 pt was having a lot of pain with  movements in his R shoulder. He tore his rotator cuff pulling himself up off the ground. Pt had 3 injections prior to deciding on surgery.   PRECAUTIONS: Shoulder  WEIGHT BEARING RESTRICTIONS: Yes No weight at this time  PAIN:  Are you having pain? Yes: NPRS scale: 1/10 Pain location: trapezius and anterior shoulder girdle Pain description: tight, aching Aggravating factors: movment Relieving factors: supported pressure relief, medication  FALLS: Has patient fallen in last 6 months? No  PATIENT GOALS: To get back to normal  NEXT MD VISIT: 07/16/22  OBJECTIVE:   HAND DOMINANCE: Right  ADLs: Overall ADLs: Pt requiring increased time and up to min assist for dressing, bathing and grooming. All IADL's, pt has his wife assist/complete for him at this time.   FUNCTIONAL OUTCOME MEASURES: FOTO: 28.54 06/13/22: 55/100 07/05/22: 61.12/100  UPPER EXTREMITY ROM:       Assessed supine, er/IR adducted  Passive ROM Right Eval Supine Right 06/13/22  Shoulder flexion 86 155  Shoulder abduction 101 160  Shoulder internal rotation 90 90  Shoulder external rotation 12 78  (Blank rows = not tested)   Assessed seated, er/IR adducted  Active ROM  Right 06/13/22 Right 07/05/22  Shoulder flexion  98 146  Shoulder abduction  77 156  Shoulder internal rotation  90 90  Shoulder external rotation  41 44  (Blank rows = not tested)  UPPER EXTREMITY MMT:  MMT Right eval Right 06/13/22 Right 07/05/22  Shoulder flexion   3/5  Shoulder abduction   3/5  Shoulder adduction   5/5  Shoulder extension   4+/5  Shoulder internal rotation   4+/5  Shoulder external rotation   4-/5  Elbow flexion   4+/5  Elbow extension   4-/5  (Blank rows = not tested)   OBSERVATIONS: Severe fascial restriction noted in the trapezius, biceps, anterior shoulder girdle, scapula, and subscapularis   TODAY'S TREATMENT:                                                                                                                               DATE:  07/24/22 -A/ROM: seated, flexion, abduction, protraction, horizontal abduction, er/IR, x15 -Ball Roll on the wall: flexion, abduction, x12 -Therapy Ball Exercises: chest press, overhead press, flexion, V ups, circles both directions, x10 -Arms on Fire x60" -Shoulder Strengthening: green band, flexion, abduction (red), protraction, horizontal abduction, er/IR, x12 -Scapula Strengthening: green band, extension, retraction, rows, x12 -Ball on the wall: ABC's -UBE: level 3, 3.0 KPH+, 3' forwards and backwards  07/15/22 -A/ROM: seated, flexion, abduction, protraction, horizontal abduction, er/IR, x10 -Arms on fire x60" -ABC's at 90 degrees flexion  -PNF Strengthening: green band, chest pulls, er pulls, PNF up, PNF down -Functional reaching: 2lb wrist weight donned, 10 items from shoulder height shelf flexion down abduction up, 10 items from overhead shelf flexion down and up -Shoulder strengthening: green theraband, flexion, abduction(green with limited motion, red with full motion), horizontal abduction, protraction, er, IR, x12 -Ball rolls on the wall flexion x10 -Therapy Ball Exercises: flexion, protraction, x10   07/09/22 -A/ROM: seated, flexion, abduction, protraction, horizontal abduction, er/IR, x12 -Stretches: flexion stretch, doorway er stretch, bicep stretch, latissimus stretch, 5x15" -Shoulder strengthening: red theraband, flexion, abduction, horizontal abduction, protraction, er, IR, x10 -[facility] on the Wall: flexion, abduction, x10 -UBE: level 2, 3.0+ RPM, 2' forwards and backwards   PATIENT EDUCATION: Education details: Therapy ball exercises Person educated: Patient Education method: Explanation, Demonstration, and Handouts Education comprehension: verbalized understanding and returned demonstration  HOME EXERCISE PROGRAM: 1/23: Table Slides 1/26: Thumb tacs 2/7: AA/ROM 2/20: Isometrics and Wall Slides 2/27: Stretches (er and  biceps) 3/1: A/ROM 3/19: red scapular theraband; practice functional reaching 3/27: shoulder strengthening with dumbbells 3/29: Loop Band Exercises 4/2: Shoulder strengthening with theraband 4/8: PNF Strengthening 4/17: Therapy Ball Exercises  GOALS: Goals reviewed with patient? Yes  SHORT TERM GOALS: Target date: 05/31/22  Pt will decrease fascial restrictions to minimal amounts or less in the RUE, in order to improve mobility required for overhead reaching tasks.  Goal status: IN PROGRESS  2.  Pt will improve P/ROM to [facility] in RUE to improve ability to perform dressing tasks with minimal compensatory techniques.  Goal status: MET  3.  Pt will improve strength to 4/5 in RUE to improve ability to assist LUE with lifting objects needed during dressing, bathing, and grooming tasks. Goal status:  IN PROGRESS   LONG TERM GOALS: Target date: 07/05/22  Pt will be provided and educated on HEP to improve mobility in RUE required for ADL completion.  Goal status: IN PROGRESS  2.  Pt will decrease pain in RUE to 3/10 or less in order to sleep for 3+ consecutive hours without waking due to pain.  Goal status: IN PROGRESS  3.  Pt will increase A/ROM of RUE to [facility] to improve ability to reach overhead and behind back during dressing and bathing tasks.  Goal status: IN PROGRESS  4.  Pt will increase strength in RUE to 5/5 to improve ability to perform lifting tasks during cooking and cleaning, with no compensatory techniques.  Goal status: IN PROGRESS   ASSESSMENT:  CLINICAL IMPRESSION: Pt continuing to demonstrate good movement pattern and improving ROM. He continues to compensate by bending his elbow and hiking his shoulder, however with cuing from OT and increased time/slowing down his movements he is able to correct it. OT added therapy ball exercises this session to continue improving strength and stamina. He required frequent rest breaks during strength based exercises due to fatigue. OT  providing verbal and tactile cuing for positioning and technique with all exercises.   PERFORMANCE DEFICITS: in functional skills including ADLs, IADLs, ROM, strength, pain, fascial restrictions, Gross motor control, body mechanics, and UE functional use.   PLAN:  OT FREQUENCY: 2x/week  OT DURATION: 8 weeks  PLANNED INTERVENTIONS: self care/ADL training, therapeutic exercise, therapeutic activity, manual therapy, scar mobilization, passive range of motion, functional mobility training, electrical stimulation, ultrasound, moist heat, cryotherapy, patient/family education, coping strategies training, and DME and/or AE instructions  CONSULTED AND AGREED WITH PLAN OF CARE: Patient  PLAN FOR NEXT SESSION: manual therapy, A/ROM in standing, proximal shoulder strengthening, functional reaching into/out of cabinets, add loop band exercises and therapy ball exercises,    Trish Mage, OTR/L [phone] 07/24/2022, 9:51 AM

## 2022-07-26 ENCOUNTER — Ambulatory Visit (HOSPITAL_COMMUNITY): Payer: BC Managed Care – PPO | Admitting: Occupational Therapy

## 2022-07-26 ENCOUNTER — Encounter (HOSPITAL_COMMUNITY): Payer: Self-pay | Admitting: Occupational Therapy

## 2022-07-26 DIAGNOSIS — M25511 Pain in right shoulder: Secondary | ICD-10-CM | POA: Diagnosis not present

## 2022-07-26 DIAGNOSIS — R29898 Other symptoms and signs involving the musculoskeletal system: Secondary | ICD-10-CM

## 2022-07-26 DIAGNOSIS — M25611 Stiffness of right shoulder, not elsewhere classified: Secondary | ICD-10-CM | POA: Diagnosis not present

## 2022-07-26 NOTE — Therapy (Signed)
OUTPATIENT OCCUPATIONAL THERAPY ORTHO TREATMENT NOTE REASSESSMENT NOTE  Patient Name: Riley Larson MRN: [id number] DOB:08/24/1970, 52 y.o., male Today's Date: 07/26/2022  PCP: Assunta Found, MD REFERRING PROVIDER: Frederico Hamman, MD   END OF SESSION:  OT End of Session - 07/26/22 0952     Visit Number 17    Number of Visits 17    Date for OT Re-Evaluation 08/23/22    Authorization Type BC[employer]    Progress Note Due on Visit 10    OT Start Time 0950    OT Stop Time 1030    OT Time Calculation (min) 40 min    Activity Tolerance Patient tolerated treatment well    Behavior During Therapy [facility] for tasks assessed/performed             Past Medical History:  Diagnosis Date   Arthritis    Hypertension    Past Surgical History:  Procedure Laterality Date   BACK SURGERY     COLONOSCOPY WITH PROPOFOL N/A 03/14/2020   Procedure: COLONOSCOPY WITH PROPOFOL;  Surgeon: Dolores Frame, MD;  [facility]: [facility];  Service: Gastroenterology;  Laterality: N/A;  9:15   LAPAROSCOPIC GASTRIC BANDING     left index finger surgery      TOTAL KNEE ARTHROPLASTY Right 03/09/2021   Procedure: TOTAL KNEE ARTHROPLASTY;  Surgeon: Frederico Hamman, MD;  Location: [facility];  Service: Orthopedics;  Laterality: Right;   TOTAL KNEE ARTHROPLASTY Left 06/01/2021   Procedure: TOTAL KNEE ARTHROPLASTY;  Surgeon: Frederico Hamman, MD;  Location: [facility];  Service: Orthopedics;  Laterality: Left;   Patient Active Problem List   Diagnosis Date Noted   CELLULITIS, LEFT HAND 10/16/2009    ONSET DATE: 04/05/22  REFERRING DIAG: R shoulder Arthoplasty and rortator cuff repair  THERAPY DIAG:  Acute pain of right shoulder  Shoulder stiffness, right  Other symptoms and signs involving the musculoskeletal system  Rationale for Evaluation and Treatment: Rehabilitation  SUBJECTIVE:   SUBJECTIVE STATEMENT: "I can feel it, but it's not killing me"  PERTINENT HISTORY: In 2023 pt was having a lot of  pain with movements in his R shoulder. He tore his rotator cuff pulling himself up off the ground. Pt had 3 injections prior to deciding on surgery.   PRECAUTIONS: Shoulder  WEIGHT BEARING RESTRICTIONS: Yes No weight at this time  PAIN:  Are you having pain? Yes: NPRS scale: 1/10 Pain location: trapezius and anterior shoulder girdle Pain description: tight, aching Aggravating factors: movment Relieving factors: supported pressure relief, medication  FALLS: Has patient fallen in last 6 months? No  PATIENT GOALS: To get back to normal  NEXT MD VISIT: 07/16/22  OBJECTIVE:   HAND DOMINANCE: Right  ADLs: Overall ADLs: Pt requiring increased time and up to min assist for dressing, bathing and grooming. All IADL's, pt has his wife assist/complete for him at this time.   FUNCTIONAL OUTCOME MEASURES: FOTO: 28.54 06/13/22: 55/100 07/05/22: 61.12/100  UPPER EXTREMITY ROM:       Assessed supine, er/IR adducted  Passive ROM Right Eval Supine Right 06/13/22  Shoulder flexion 86 155  Shoulder abduction 101 160  Shoulder internal rotation 90 90  Shoulder external rotation 12 78  (Blank rows = not tested)   Assessed seated, er/IR adducted  Active ROM  Right 06/13/22 Right 07/05/22 Right 07/26/22  Shoulder flexion  98 146 173  Shoulder abduction  77 156 165  Shoulder internal rotation  90 90 90  Shoulder external rotation  41 44 64  (Blank  rows = not tested)  UPPER EXTREMITY MMT:     MMT Right eval Right 06/13/22 Right 07/05/22 Right 07/26/22  Shoulder flexion   3/5 3/5  Shoulder abduction   3/5 4-/5  Shoulder adduction   5/5 5/5  Shoulder extension   4+/5 5/5  Shoulder internal rotation   4+/5 5/5  Shoulder external rotation   4-/5 4-/5  Elbow flexion   4+/5 5/5  Elbow extension   4-/5 5/5  (Blank rows = not tested)   OBSERVATIONS: Severe fascial restriction noted in the trapezius, biceps, anterior shoulder girdle, scapula, and subscapularis   TODAY'S TREATMENT:                                                                                                                               DATE:   07/26/22 -A/ROM: seated, flexion, abduction, protraction, horizontal abduction, er/IR, x15 -Overhead lacing down and reversing -Shoulder Strengthening 3lb dumbbells: flexion, abduction, protraction, horizontal abduction, er/IR, x10 -Ball Rolls on the wall: flexion, abduction, x15 -Therapy Ball Exercises: chest press, overhead press, flexion, V ups, circles both directions, x10 -X to V arms: RUE on top and LUE on top, x15 -Goal Post arms x15 -stretching: posterior capsule, bicep, er, doorway, 3x15"  07/24/22 -A/ROM: seated, flexion, abduction, protraction, horizontal abduction, er/IR, x15 -Ball Roll on the wall: flexion, abduction, x12 -Therapy Ball Exercises: chest press, overhead press, flexion, V ups, circles both directions, x10 -Arms on Fire x60" -Shoulder Strengthening: green band, flexion, abduction (red), protraction, horizontal abduction, er/IR, x12 -Scapula Strengthening: green band, extension, retraction, rows, x12 -Ball on the wall: ABC's -UBE: level 3, 3.0 KPH+, 3' forwards and backwards  07/15/22 -A/ROM: seated, flexion, abduction, protraction, horizontal abduction, er/IR, x10 -Arms on fire x60" -ABC's at 90 degrees flexion  -PNF Strengthening: green band, chest pulls, er pulls, PNF up, PNF down -Functional reaching: 2lb wrist weight donned, 10 items from shoulder height shelf flexion down abduction up, 10 items from overhead shelf flexion down and up -Shoulder strengthening: green theraband, flexion, abduction(green with limited motion, red with full motion), horizontal abduction, protraction, er, IR, x12 -Ball rolls on the wall flexion x10 -Therapy Ball Exercises: flexion, protraction, x10     PATIENT EDUCATION: Education details: Review HEP Person educated: Patient Education method: Programmer, multimedia, Demonstration, and Handouts Education  comprehension: verbalized understanding and returned demonstration  HOME EXERCISE PROGRAM: 1/23: Table Slides 1/26: Thumb tacs 2/7: AA/ROM 2/20: Isometrics and Wall Slides 2/27: Stretches (er and biceps) 3/1: A/ROM 3/19: red scapular theraband; practice functional reaching 3/27: shoulder strengthening with dumbbells 3/29: Loop Band Exercises 4/2: Shoulder strengthening with theraband 4/8: PNF Strengthening 4/17: Therapy Ball Exercises  GOALS: Goals reviewed with patient? Yes  SHORT TERM GOALS: Target date: 05/31/22  Pt will decrease fascial restrictions to minimal amounts or less in the RUE, in order to improve mobility required for overhead reaching tasks.  Goal status: IN PROGRESS  2.  Pt will improve P/ROM to [facility] in RUE  to improve ability to perform dressing tasks with minimal compensatory techniques.  Goal status: MET  3.  Pt will improve strength to 4/5 in RUE to improve ability to assist LUE with lifting objects needed during dressing, bathing, and grooming tasks. Goal status: IN PROGRESS   LONG TERM GOALS: Target date: 07/05/22  Pt will be provided and educated on HEP to improve mobility in RUE required for ADL completion.  Goal status: IN PROGRESS  2.  Pt will decrease pain in RUE to 3/10 or less in order to sleep for 3+ consecutive hours without waking due to pain.  Goal status: IN PROGRESS  3.  Pt will increase A/ROM of RUE to [facility] to improve ability to reach overhead and behind back during dressing and bathing tasks.  Goal status: IN PROGRESS  4.  Pt will increase strength in RUE to 5/5 to improve ability to perform lifting tasks during cooking and cleaning, with no compensatory techniques.  Goal status: IN PROGRESS   ASSESSMENT:  CLINICAL IMPRESSION: Pt presenting to session for reassessment this session. He continues to have some weakness, especially when in flexion, abduction, and external rotation. Overall his ROM is within normal functioning limits, with  minimal catching when bringing his arm back down to his side. OT is reducing pt to 1 time per week for 4 weeks to focus on strengthening. The remainder of the session focusing on strengthening, pt improving to 3lb dumbbells for overall shoulder strengthening. He continues to fatigue quickly and requires breaks in between exercises. OT providing verbal and tactile cuing for positioning and technique.   PERFORMANCE DEFICITS: in functional skills including ADLs, IADLs, ROM, strength, pain, fascial restrictions, Gross motor control, body mechanics, and UE functional use.   PLAN:  OT FREQUENCY: 2x/week  OT DURATION: 8 weeks  PLANNED INTERVENTIONS: self care/ADL training, therapeutic exercise, therapeutic activity, manual therapy, scar mobilization, passive range of motion, functional mobility training, electrical stimulation, ultrasound, moist heat, cryotherapy, patient/family education, coping strategies training, and DME and/or AE instructions  CONSULTED AND AGREED WITH PLAN OF CARE: Patient  PLAN FOR NEXT SESSION: manual therapy, A/ROM in standing, proximal shoulder strengthening, functional reaching into/out of cabinets, add loop band exercises and therapy ball exercises,    Trish Mage, OTR/L [phone] 07/26/2022, 9:53 AM

## 2022-07-29 ENCOUNTER — Encounter (HOSPITAL_COMMUNITY): Payer: BC Managed Care – PPO | Admitting: Occupational Therapy

## 2022-07-29 ENCOUNTER — Telehealth (HOSPITAL_COMMUNITY): Payer: Self-pay | Admitting: Occupational Therapy

## 2022-07-29 NOTE — Telephone Encounter (Signed)
OT spoke with pt about reducing to 1x per week, as pt has made significant progress. Pt requesting all Monday appointments be cancelled and keep his Wednesday appointments.

## 2022-07-31 ENCOUNTER — Ambulatory Visit (HOSPITAL_COMMUNITY): Payer: BC Managed Care – PPO | Admitting: Occupational Therapy

## 2022-07-31 ENCOUNTER — Encounter (HOSPITAL_COMMUNITY): Payer: Self-pay | Admitting: Occupational Therapy

## 2022-07-31 DIAGNOSIS — M25511 Pain in right shoulder: Secondary | ICD-10-CM

## 2022-07-31 DIAGNOSIS — R29898 Other symptoms and signs involving the musculoskeletal system: Secondary | ICD-10-CM

## 2022-07-31 DIAGNOSIS — M25611 Stiffness of right shoulder, not elsewhere classified: Secondary | ICD-10-CM | POA: Diagnosis not present

## 2022-07-31 NOTE — Therapy (Signed)
OUTPATIENT OCCUPATIONAL THERAPY ORTHO TREATMENT NOTE   Patient Name: Riley Larson MRN: [id number] DOB:29-Jun-1970, 52 y.o., male Today's Date: 07/31/2022  PCP: Assunta Found, MD REFERRING PROVIDER: Frederico Hamman, MD   END OF SESSION:  OT End of Session - 07/31/22 0901     Visit Number 18    Number of Visits 21    Date for OT Re-Evaluation 08/23/22    Authorization Type [employer]    Progress Note Due on Visit 10    OT Start Time 0900    OT Stop Time 0940    OT Time Calculation (min) 40 min    Activity Tolerance Patient tolerated treatment well    Behavior During Therapy [facility] for tasks assessed/performed             Past Medical History:  Diagnosis Date   Arthritis    Hypertension    Past Surgical History:  Procedure Laterality Date   BACK SURGERY     COLONOSCOPY WITH PROPOFOL N/A 03/14/2020   Procedure: COLONOSCOPY WITH PROPOFOL;  Surgeon: Dolores Frame, MD;  Location: [facility];  Service: Gastroenterology;  Laterality: N/A;  9:15   LAPAROSCOPIC GASTRIC BANDING     left index finger surgery      TOTAL KNEE ARTHROPLASTY Right 03/09/2021   Procedure: TOTAL KNEE ARTHROPLASTY;  Surgeon: Frederico Hamman, MD;  Location: [facility];  Service: Orthopedics;  Laterality: Right;   TOTAL KNEE ARTHROPLASTY Left 06/01/2021   Procedure: TOTAL KNEE ARTHROPLASTY;  Surgeon: Frederico Hamman, MD;  Location: [facility];  Service: Orthopedics;  Laterality: Left;   Patient Active Problem List   Diagnosis Date Noted   CELLULITIS, LEFT HAND 10/16/2009    ONSET DATE: 04/05/22  REFERRING DIAG: R shoulder Arthoplasty and rortator cuff repair  THERAPY DIAG:  Acute pain of right shoulder  Shoulder stiffness, right  Other symptoms and signs involving the musculoskeletal system  Rationale for Evaluation and Treatment: Rehabilitation  SUBJECTIVE:   SUBJECTIVE STATEMENT: "I feel like it's getting better"  PERTINENT HISTORY: In 2023 pt was having a lot of pain with movements in  his R shoulder. He tore his rotator cuff pulling himself up off the ground. Pt had 3 injections prior to deciding on surgery.   PRECAUTIONS: Shoulder  WEIGHT BEARING RESTRICTIONS: Yes No weight at this time  PAIN:  Are you having pain? No  FALLS: Has patient fallen in last 6 months? No  PATIENT GOALS: To get back to normal  NEXT MD VISIT: 07/16/22  OBJECTIVE:   HAND DOMINANCE: Right  ADLs: Overall ADLs: Pt requiring increased time and up to min assist for dressing, bathing and grooming. All IADL's, pt has his wife assist/complete for him at this time.   FUNCTIONAL OUTCOME MEASURES: FOTO: 28.54 06/13/22: 55/100 07/05/22: 61.12/100  UPPER EXTREMITY ROM:       Assessed supine, er/IR adducted  Passive ROM Right Eval Supine Right 06/13/22  Shoulder flexion 86 155  Shoulder abduction 101 160  Shoulder internal rotation 90 90  Shoulder external rotation 12 78  (Blank rows = not tested)   Assessed seated, er/IR adducted  Active ROM  Right 06/13/22 Right 07/05/22 Right 07/26/22  Shoulder flexion  98 146 173  Shoulder abduction  77 156 165  Shoulder internal rotation  90 90 90  Shoulder external rotation  41 44 64  (Blank rows = not tested)  UPPER EXTREMITY MMT:     MMT Right eval Right 06/13/22 Right 07/05/22 Right 07/26/22  Shoulder flexion   3/5  3/5  Shoulder abduction   3/5 4-/5  Shoulder adduction   5/5 5/5  Shoulder extension   4+/5 5/5  Shoulder internal rotation   4+/5 5/5  Shoulder external rotation   4-/5 4-/5  Elbow flexion   4+/5 5/5  Elbow extension   4-/5 5/5  (Blank rows = not tested)   OBSERVATIONS: Severe fascial restriction noted in the trapezius, biceps, anterior shoulder girdle, scapula, and subscapularis   TODAY'S TREATMENT:                                                                                                                              DATE:   07/31/22 -A/ROM: seated, flexion, abduction, protraction, horizontal abduction, er/IR,  x15 -Shoulder Strengthening: green band, flexion, abduction (red), protraction, horizontal abduction, er/IR, x15 -Scapula Strengthening: green band, extension, retraction, rows, x15 -Therapy Ball Exercises: blue weighted ball, chest press, over head press, flexion, V ups, circles both directions, x15 -X to V arms: RUE on top and LUE on top, x15, 2lb dumbbells -Goal Post arms x15, 2lb dumbbells  07/26/22 -A/ROM: seated, flexion, abduction, protraction, horizontal abduction, er/IR, x15 -Overhead lacing down and reversing -Shoulder Strengthening 3lb dumbbells: flexion, abduction, protraction, horizontal abduction, er/IR, x10 -Ball Rolls on the wall: flexion, abduction, x15 -Therapy Ball Exercises: chest press, overhead press, flexion, V ups, circles both directions, x10 -X to V arms: RUE on top and LUE on top, x15 -Goal Post arms x15 -stretching: posterior capsule, bicep, er, doorway, 3x15"  07/24/22 -A/ROM: seated, flexion, abduction, protraction, horizontal abduction, er/IR, x15 -Ball Roll on the wall: flexion, abduction, x12 -Therapy Ball Exercises: chest press, overhead press, flexion, V ups, circles both directions, x10 -Arms on Fire x60" -Shoulder Strengthening: green band, flexion, abduction (red), protraction, horizontal abduction, er/IR, x12 -Scapula Strengthening: green band, extension, retraction, rows, x12 -Ball on the wall: ABC's -UBE: level 3, 3.0 KPH+, 3' forwards and backwards  07/15/22 -A/ROM: seated, flexion, abduction, protraction, horizontal abduction, er/IR, x10 -Arms on fire x60" -ABC's at 90 degrees flexion  -PNF Strengthening: green band, chest pulls, er pulls, PNF up, PNF down -Functional reaching: 2lb wrist weight donned, 10 items from shoulder height shelf flexion down abduction up, 10 items from overhead shelf flexion down and up -Shoulder strengthening: green theraband, flexion, abduction(green with limited motion, red with full motion), horizontal abduction,  protraction, er, IR, x12 -Ball rolls on the wall flexion x10 -Therapy Ball Exercises: flexion, protraction, x10     PATIENT EDUCATION: Education details: Review HEP Person educated: Patient Education method: Programmer, multimedia, Demonstration, and Handouts Education comprehension: verbalized understanding and returned demonstration  HOME EXERCISE PROGRAM: 1/23: Table Slides 1/26: Thumb tacs 2/7: AA/ROM 2/20: Isometrics and Wall Slides 2/27: Stretches (er and biceps) 3/1: A/ROM 3/19: red scapular theraband; practice functional reaching 3/27: shoulder strengthening with dumbbells 3/29: Loop Band Exercises 4/2: Shoulder strengthening with theraband 4/8: PNF Strengthening 4/17: Therapy Ball Exercises  GOALS: Goals reviewed with patient? Yes  SHORT TERM GOALS: Target  date: 05/31/22  Pt will decrease fascial restrictions to minimal amounts or less in the RUE, in order to improve mobility required for overhead reaching tasks.  Goal status: IN PROGRESS  2.  Pt will improve P/ROM to [facility] in RUE to improve ability to perform dressing tasks with minimal compensatory techniques.  Goal status: MET  3.  Pt will improve strength to 4/5 in RUE to improve ability to assist LUE with lifting objects needed during dressing, bathing, and grooming tasks. Goal status: IN PROGRESS   LONG TERM GOALS: Target date: 07/05/22  Pt will be provided and educated on HEP to improve mobility in RUE required for ADL completion.  Goal status: IN PROGRESS  2.  Pt will decrease pain in RUE to 3/10 or less in order to sleep for 3+ consecutive hours without waking due to pain.  Goal status: IN PROGRESS  3.  Pt will increase A/ROM of RUE to [facility] to improve ability to reach overhead and behind back during dressing and bathing tasks.  Goal status: IN PROGRESS  4.  Pt will increase strength in RUE to 5/5 to improve ability to perform lifting tasks during cooking and cleaning, with no compensatory techniques.  Goal  status: IN PROGRESS   ASSESSMENT:  CLINICAL IMPRESSION: Pt presenting to session for reassessment this session. He continues to have some weakness, especially when in flexion, abduction, and external rotation. Overall his ROM is within normal functioning limits, with minimal catching when bringing his arm back down to his side. OT is reducing pt to 1 time per week for 4 weeks to focus on strengthening. The remainder of the session focusing on strengthening, pt improving to 3lb dumbbells for overall shoulder strengthening. He continues to fatigue quickly and requires breaks in between exercises. OT providing verbal and tactile cuing for positioning and technique.   PERFORMANCE DEFICITS: in functional skills including ADLs, IADLs, ROM, strength, pain, fascial restrictions, Gross motor control, body mechanics, and UE functional use.   PLAN:  OT FREQUENCY: 2x/week  OT DURATION: 8 weeks  PLANNED INTERVENTIONS: self care/ADL training, therapeutic exercise, therapeutic activity, manual therapy, scar mobilization, passive range of motion, functional mobility training, electrical stimulation, ultrasound, moist heat, cryotherapy, patient/family education, coping strategies training, and DME and/or AE instructions  CONSULTED AND AGREED WITH PLAN OF CARE: Patient  PLAN FOR NEXT SESSION: manual therapy, A/ROM in standing, proximal shoulder strengthening, functional reaching into/out of cabinets, add loop band exercises and therapy ball exercises,    Trish Mage, OTR/L [phone] 07/31/2022, 9:02 AM

## 2022-08-05 ENCOUNTER — Encounter (HOSPITAL_COMMUNITY): Payer: BC Managed Care – PPO | Admitting: Occupational Therapy

## 2022-08-07 ENCOUNTER — Ambulatory Visit (HOSPITAL_COMMUNITY): Payer: BC Managed Care – PPO | Attending: Orthopedic Surgery | Admitting: Occupational Therapy

## 2022-08-07 ENCOUNTER — Encounter (HOSPITAL_COMMUNITY): Payer: Self-pay | Admitting: Occupational Therapy

## 2022-08-07 DIAGNOSIS — M25511 Pain in right shoulder: Secondary | ICD-10-CM

## 2022-08-07 DIAGNOSIS — M25611 Stiffness of right shoulder, not elsewhere classified: Secondary | ICD-10-CM

## 2022-08-07 DIAGNOSIS — R29898 Other symptoms and signs involving the musculoskeletal system: Secondary | ICD-10-CM | POA: Diagnosis not present

## 2022-08-07 NOTE — Therapy (Signed)
OUTPATIENT OCCUPATIONAL THERAPY ORTHO TREATMENT NOTE   Patient Name: Riley Larson MRN: [id number] DOB:May 26, 1970, 52 y.o., male Today's Date: 07/31/2022  PCP: Assunta Found, MD REFERRING PROVIDER: Frederico Hamman, MD   END OF SESSION:  OT End of Session - 07/31/22 0901     Visit Number 18    Number of Visits 21    Date for OT Re-Evaluation 08/23/22    Authorization Type [employer]    Progress Note Due on Visit 10    OT Start Time 0900    OT Stop Time 0940    OT Time Calculation (min) 40 min    Activity Tolerance Patient tolerated treatment well    Behavior During Therapy [facility] for tasks assessed/performed             Past Medical History:  Diagnosis Date   Arthritis    Hypertension    Past Surgical History:  Procedure Laterality Date   BACK SURGERY     COLONOSCOPY WITH PROPOFOL N/A 03/14/2020   Procedure: COLONOSCOPY WITH PROPOFOL;  Surgeon: Dolores Frame, MD;  [facility];  Service: Gastroenterology;  Laterality: N/A;  9:15   LAPAROSCOPIC GASTRIC BANDING     left index finger surgery      TOTAL KNEE ARTHROPLASTY Right 03/09/2021   Procedure: TOTAL KNEE ARTHROPLASTY;  Surgeon: Frederico Hamman, MD;  Location: [facility];  Service: Orthopedics;  Laterality: Right;   TOTAL KNEE ARTHROPLASTY Left 06/01/2021   Procedure: TOTAL KNEE ARTHROPLASTY;  Surgeon: Frederico Hamman, MD;  Location: [facility];  Service: Orthopedics;  Laterality: Left;   Patient Active Problem List   Diagnosis Date Noted   CELLULITIS, LEFT HAND 10/16/2009    ONSET DATE: 04/05/22  REFERRING DIAG: R shoulder Arthoplasty and rortator cuff repair  THERAPY DIAG:  Acute pain of right shoulder  Shoulder stiffness, right  Other symptoms and signs involving the musculoskeletal system  Rationale for Evaluation and Treatment: Rehabilitation  SUBJECTIVE:   SUBJECTIVE STATEMENT: "I have a catch in my back today"  PERTINENT HISTORY: In 2023 pt was having a lot of pain with movements in  his R shoulder. He tore his rotator cuff pulling himself up off the ground. Pt had 3 injections prior to deciding on surgery.   PRECAUTIONS: Shoulder  WEIGHT BEARING RESTRICTIONS: Yes No weight at this time  PAIN:  Are you having pain? No  FALLS: Has patient fallen in last 6 months? No  PATIENT GOALS: To get back to normal  NEXT MD VISIT: 07/16/22  OBJECTIVE:   HAND DOMINANCE: Right  ADLs: Overall ADLs: Pt requiring increased time and up to min assist for dressing, bathing and grooming. All IADL's, pt has his wife assist/complete for him at this time.   FUNCTIONAL OUTCOME MEASURES: FOTO: 28.54 06/13/22: 55/100 07/05/22: 61.12/100  UPPER EXTREMITY ROM:       Assessed supine, er/IR adducted  Passive ROM Right Eval Supine Right 06/13/22  Shoulder flexion 86 155  Shoulder abduction 101 160  Shoulder internal rotation 90 90  Shoulder external rotation 12 78  (Blank rows = not tested)   Assessed seated, er/IR adducted  Active ROM  Right 06/13/22 Right 07/05/22 Right 07/26/22  Shoulder flexion  98 146 173  Shoulder abduction  77 156 165  Shoulder internal rotation  90 90 90  Shoulder external rotation  41 44 64  (Blank rows = not tested)  UPPER EXTREMITY MMT:     MMT Right eval Right 06/13/22 Right 07/05/22 Right 07/26/22  Shoulder flexion  3/5 3/5  Shoulder abduction   3/5 4-/5  Shoulder adduction   5/5 5/5  Shoulder extension   4+/5 5/5  Shoulder internal rotation   4+/5 5/5  Shoulder external rotation   4-/5 4-/5  Elbow flexion   4+/5 5/5  Elbow extension   4-/5 5/5  (Blank rows = not tested)   OBSERVATIONS: Severe fascial restriction noted in the trapezius, biceps, anterior shoulder girdle, scapula, and subscapularis   TODAY'S TREATMENT:                                                                                                                              DATE:   08/07/22 -Shoulder Strengthening: 3lb dumbbells, flexion, abduction, protraction,  horizontal abduction, er/IR, x15 -Farmers carry: 90 degree flexion, 4lb dumbbells, 60" -Goal Post Arms x15, 3lb dumbbells -X to V arms, x15, 3lb dumbbells -Pulls: green band, belly button level, chest level, forehead level, x10 each -PNF Strengthening: green band, er pulls, PNF up, PNF down, x15 -Shoulder Strengthening: green band, flexion, abduction (red), protraction, horizontal abduction, er/IR, x15 -Functional Reaching: 3lb wrist weight, x10 cones in flexion from counter to shoulder height, then to middle shelf, then overhead shelf, x10 cones in abduction from counter to shoulder height, then to middle shelf  07/31/22 -A/ROM: seated, flexion, abduction, protraction, horizontal abduction, er/IR, x15 -Shoulder Strengthening: green band, flexion, abduction (red), protraction, horizontal abduction, er/IR, x15 -Scapula Strengthening: green band, extension, retraction, rows, x15 -Therapy Ball Exercises: blue weighted ball, chest press, over head press, flexion, V ups, circles both directions, x15 -X to V arms: RUE on top and LUE on top, x15, 2lb dumbbells -Goal Post arms x15, 2lb dumbbells  07/26/22 -A/ROM: seated, flexion, abduction, protraction, horizontal abduction, er/IR, x15 -Overhead lacing down and reversing -Shoulder Strengthening 3lb dumbbells: flexion, abduction, protraction, horizontal abduction, er/IR, x10 -Ball Rolls on the wall: flexion, abduction, x15 -Therapy Ball Exercises: chest press, overhead press, flexion, V ups, circles both directions, x10 -X to V arms: RUE on top and LUE on top, x15 -Goal Post arms x15 -stretching: posterior capsule, bicep, er, doorway, 3x15"   PATIENT EDUCATION: Education details: Functional Reaching at home Person educated: Patient Education method: Explanation, Demonstration, and Handouts Education comprehension: verbalized understanding and returned demonstration  HOME EXERCISE PROGRAM: 1/23: Table Slides 1/26: Thumb tacs 2/7:  AA/ROM 2/20: Isometrics and Wall Slides 2/27: Stretches (er and biceps) 3/1: A/ROM 3/19: red scapular theraband; practice functional reaching 3/27: shoulder strengthening with dumbbells 3/29: Loop Band Exercises 4/2: Shoulder strengthening with theraband 4/8: PNF Strengthening 4/17: Therapy Ball Exercises 4/24: X to V arms and Goal Post Arms 5/1: Functional reaching in shelves  GOALS: Goals reviewed with patient? Yes  SHORT TERM GOALS: Target date: 05/31/22  Pt will decrease fascial restrictions to minimal amounts or less in the RUE, in order to improve mobility required for overhead reaching tasks.  Goal status: IN PROGRESS  2.  Pt will improve P/ROM to [facility] in RUE to  improve ability to perform dressing tasks with minimal compensatory techniques.  Goal status: MET  3.  Pt will improve strength to 4/5 in RUE to improve ability to assist LUE with lifting objects needed during dressing, bathing, and grooming tasks. Goal status: IN PROGRESS   LONG TERM GOALS: Target date: 07/05/22  Pt will be provided and educated on HEP to improve mobility in RUE required for ADL completion.  Goal status: IN PROGRESS  2.  Pt will decrease pain in RUE to 3/10 or less in order to sleep for 3+ consecutive hours without waking due to pain.  Goal status: IN PROGRESS  3.  Pt will increase A/ROM of RUE to [facility] to improve ability to reach overhead and behind back during dressing and bathing tasks.  Goal status: IN PROGRESS  4.  Pt will increase strength in RUE to 5/5 to improve ability to perform lifting tasks during cooking and cleaning, with no compensatory techniques.  Goal status: IN PROGRESS   ASSESSMENT:  CLINICAL IMPRESSION: This session pt reporting no shoulder pain, only stating that his arm is fatiguing quickly. This session he continues to focus on strengthening both the shoulder blades for stability, as well as the deltoids and biceps for effective shoulder movements. His movement pattern  is improving and only displaying difficulty with abduction when adding weights/resistance. OT providing verbal and visual cuing this session for positioning and technique.   PERFORMANCE DEFICITS: in functional skills including ADLs, IADLs, ROM, strength, pain, fascial restrictions, Gross motor control, body mechanics, and UE functional use.   PLAN:  OT FREQUENCY: 2x/week  OT DURATION: 8 weeks  PLANNED INTERVENTIONS: self care/ADL training, therapeutic exercise, therapeutic activity, manual therapy, scar mobilization, passive range of motion, functional mobility training, electrical stimulation, ultrasound, moist heat, cryotherapy, patient/family education, coping strategies training, and DME and/or AE instructions  CONSULTED AND AGREED WITH PLAN OF CARE: Patient  PLAN FOR NEXT SESSION: manual therapy, A/ROM in standing, proximal shoulder strengthening, functional reaching into/out of cabinets, add loop band exercises and therapy ball exercises,    Trish Mage, OTR/L [phone] 07/31/2022, 9:02 AM

## 2022-08-12 ENCOUNTER — Encounter (HOSPITAL_COMMUNITY): Payer: BC Managed Care – PPO | Admitting: Occupational Therapy

## 2022-08-13 DIAGNOSIS — M19011 Primary osteoarthritis, right shoulder: Secondary | ICD-10-CM | POA: Diagnosis not present

## 2022-08-14 ENCOUNTER — Encounter (HOSPITAL_COMMUNITY): Payer: Self-pay | Admitting: Occupational Therapy

## 2022-08-14 ENCOUNTER — Ambulatory Visit (HOSPITAL_COMMUNITY): Payer: BC Managed Care – PPO | Admitting: Occupational Therapy

## 2022-08-14 DIAGNOSIS — M25611 Stiffness of right shoulder, not elsewhere classified: Secondary | ICD-10-CM

## 2022-08-14 DIAGNOSIS — R29898 Other symptoms and signs involving the musculoskeletal system: Secondary | ICD-10-CM

## 2022-08-14 DIAGNOSIS — M25511 Pain in right shoulder: Secondary | ICD-10-CM | POA: Diagnosis not present

## 2022-08-14 NOTE — Therapy (Signed)
OUTPATIENT OCCUPATIONAL THERAPY ORTHO TREATMENT NOTE   Patient Name: Riley Larson MRN: [id number] DOB:09/28/1970, 52 y.o., male Today's Date: 08/14/2022  PCP: Assunta Found, MD REFERRING PROVIDER: Frederico Hamman, MD   END OF SESSION:  OT End of Session - 08/14/22 0906     Visit Number 20    Number of Visits 21    Date for OT Re-Evaluation 08/23/22    Authorization Type [employer]    Progress Note Due on Visit 10    OT Start Time 0905    OT Stop Time 0945    OT Time Calculation (min) 40 min    Activity Tolerance Patient tolerated treatment well    Behavior During Therapy [facility] for tasks assessed/performed             Past Medical History:  Diagnosis Date   Arthritis    Hypertension    Past Surgical History:  Procedure Laterality Date   BACK SURGERY     COLONOSCOPY WITH PROPOFOL N/A 03/14/2020   Procedure: COLONOSCOPY WITH PROPOFOL;  Surgeon: Dolores Frame, MD;  [facility]: [facility];  Service: Gastroenterology;  Laterality: N/A;  9:15   LAPAROSCOPIC GASTRIC BANDING     left index finger surgery      TOTAL KNEE ARTHROPLASTY Right 03/09/2021   Procedure: TOTAL KNEE ARTHROPLASTY;  Surgeon: Frederico Hamman, MD;  Location: [facility];  Service: Orthopedics;  Laterality: Right;   TOTAL KNEE ARTHROPLASTY Left 06/01/2021   Procedure: TOTAL KNEE ARTHROPLASTY;  Surgeon: Frederico Hamman, MD;  Location: [facility];  Service: Orthopedics;  Laterality: Left;   Patient Active Problem List   Diagnosis Date Noted   CELLULITIS, LEFT HAND 10/16/2009    ONSET DATE: 04/05/22  REFERRING DIAG: R shoulder Arthoplasty and rortator cuff repair  THERAPY DIAG:  Acute pain of right shoulder  Shoulder stiffness, right  Other symptoms and signs involving the musculoskeletal system  Rationale for Evaluation and Treatment: Rehabilitation  SUBJECTIVE:   SUBJECTIVE STATEMENT: "The doctor said I need another month"  PERTINENT HISTORY: In 2023 pt was having a lot of pain with movements  in his R shoulder. He tore his rotator cuff pulling himself up off the ground. Pt had 3 injections prior to deciding on surgery.   PRECAUTIONS: Shoulder  WEIGHT BEARING RESTRICTIONS: Yes No weight at this time  PAIN:  Are you having pain? No  FALLS: Has patient fallen in last 6 months? No  PATIENT GOALS: To get back to normal  NEXT MD VISIT: 07/16/22  OBJECTIVE:   HAND DOMINANCE: Right  ADLs: Overall ADLs: Pt requiring increased time and up to min assist for dressing, bathing and grooming. All IADL's, pt has his wife assist/complete for him at this time.   FUNCTIONAL OUTCOME MEASURES: FOTO: 28.54 06/13/22: 55/100 07/05/22: 61.12/100  UPPER EXTREMITY ROM:       Assessed supine, er/IR adducted  Passive ROM Right Eval Supine Right 06/13/22  Shoulder flexion 86 155  Shoulder abduction 101 160  Shoulder internal rotation 90 90  Shoulder external rotation 12 78  (Blank rows = not tested)   Assessed seated, er/IR adducted  Active ROM  Right 06/13/22 Right 07/05/22 Right 07/26/22  Shoulder flexion  98 146 173  Shoulder abduction  77 156 165  Shoulder internal rotation  90 90 90  Shoulder external rotation  41 44 64  (Blank rows = not tested)  UPPER EXTREMITY MMT:     MMT Right eval Right 06/13/22 Right 07/05/22 Right 07/26/22  Shoulder flexion  3/5 3/5  Shoulder abduction   3/5 4-/5  Shoulder adduction   5/5 5/5  Shoulder extension   4+/5 5/5  Shoulder internal rotation   4+/5 5/5  Shoulder external rotation   4-/5 4-/5  Elbow flexion   4+/5 5/5  Elbow extension   4-/5 5/5  (Blank rows = not tested)   OBSERVATIONS: Severe fascial restriction noted in the trapezius, biceps, anterior shoulder girdle, scapula, and subscapularis   TODAY'S TREATMENT:                                                                                                                              DATE:   08/14/22 -Ball rolls on the wall: flexion, abduction, x15 -Therapy Ball exercises:  flexion, overhead press, protraction, v ups, circles both directions, x15 -Rebounder: yellow weighted ball x10 70ft, x10 63ft, blue weighted ball x10 75ft, x10 15ft -Functional Reaching: 4lb wrist weight in flexion counter to shoulder height, shoulder height to overhead x10 cones, 3lb wrist weight in abduction, overhead to chest height, chest height to counter x10 cones -Body Blade: flexion horizontal, abduction horizontal, flexion vertical, abduction vertical, x30" each -Shoulder Strengthening: blue band, flexion, abduction, er, IR, horizontal abduction, x15 -UBE: level 4, 3.5 KPH+, 2' forwards and backwards  08/07/22 -Shoulder Strengthening: 3lb dumbbells, flexion, abduction, protraction, horizontal abduction, er/IR, x15 -Farmers carry: 90 degree flexion, 4lb dumbbells, 60" -Goal Post Arms x15, 3lb dumbbells -X to V arms, x15, 3lb dumbbells -Pulls: green band, belly button level, chest level, forehead level, x10 each -PNF Strengthening: green band, er pulls, PNF up, PNF down, x15 -Shoulder Strengthening: green band, flexion, abduction (red), protraction, horizontal abduction, er/IR, x15 -Functional Reaching: 3lb wrist weight, x10 cones in flexion from counter to shoulder height, then to middle shelf, then overhead shelf, x10 cones in abduction from counter to shoulder height, then to middle shelf  07/31/22 -A/ROM: seated, flexion, abduction, protraction, horizontal abduction, er/IR, x15 -Shoulder Strengthening: green band, flexion, abduction (red), protraction, horizontal abduction, er/IR, x15 -Scapula Strengthening: green band, extension, retraction, rows, x15 -Therapy Ball Exercises: blue weighted ball, chest press, over head press, flexion, V ups, circles both directions, x15 -X to V arms: RUE on top and LUE on top, x15, 2lb dumbbells -Goal Post arms x15, 2lb dumbbells  07/26/22 -A/ROM: seated, flexion, abduction, protraction, horizontal abduction, er/IR, x15 -Overhead lacing down and  reversing -Shoulder Strengthening 3lb dumbbells: flexion, abduction, protraction, horizontal abduction, er/IR, x10 -Ball Rolls on the wall: flexion, abduction, x15 -Therapy Ball Exercises: chest press, overhead press, flexion, V ups, circles both directions, x10 -X to V arms: RUE on top and LUE on top, x15 -Goal Post arms x15 -stretching: posterior capsule, bicep, er, doorway, 3x15"   PATIENT EDUCATION: Education details: Functional Reaching at home Person educated: Patient Education method: Explanation, Demonstration, and Handouts Education comprehension: verbalized understanding and returned demonstration  HOME EXERCISE PROGRAM: 1/23: Table Slides 1/26: Thumb tacs 2/7: AA/ROM 2/20: Isometrics and Wall Slides 2/27: Stretches (  er and biceps) 3/1: A/ROM 3/19: red scapular theraband; practice functional reaching 3/27: shoulder strengthening with dumbbells 3/29: Loop Band Exercises 4/2: Shoulder strengthening with theraband 4/8: PNF Strengthening 4/17: Therapy Ball Exercises 4/24: X to V arms and Goal Post Arms 5/1: Functional reaching in shelves  GOALS: Goals reviewed with patient? Yes  SHORT TERM GOALS: Target date: 05/31/22  Pt will decrease fascial restrictions to minimal amounts or less in the RUE, in order to improve mobility required for overhead reaching tasks.  Goal status: IN PROGRESS  2.  Pt will improve P/ROM to [facility] in RUE to improve ability to perform dressing tasks with minimal compensatory techniques.  Goal status: MET  3.  Pt will improve strength to 4/5 in RUE to improve ability to assist LUE with lifting objects needed during dressing, bathing, and grooming tasks. Goal status: IN PROGRESS   LONG TERM GOALS: Target date: 07/05/22  Pt will be provided and educated on HEP to improve mobility in RUE required for ADL completion.  Goal status: IN PROGRESS  2.  Pt will decrease pain in RUE to 3/10 or less in order to sleep for 3+ consecutive hours without  waking due to pain.  Goal status: IN PROGRESS  3.  Pt will increase A/ROM of RUE to [facility] to improve ability to reach overhead and behind back during dressing and bathing tasks.  Goal status: IN PROGRESS  4.  Pt will increase strength in RUE to 5/5 to improve ability to perform lifting tasks during cooking and cleaning, with no compensatory techniques.  Goal status: IN PROGRESS   ASSESSMENT:  CLINICAL IMPRESSION: Pt continuing to have some weakness and shaking when reaching into abduction and end ranges of flexion. He is reporting no pain, only feeling tired with exercises. This session, OT had pt using the body blade for shoulder stability and endurance, as well as the rebounder for maintaining control and more functional based endurance. Remainder of session focusing on strengthening, with pt requesting several rest breaks due to muscle fatigue. OT providing verbal and visual cuing throughout session for positioning and technique.   PERFORMANCE DEFICITS: in functional skills including ADLs, IADLs, ROM, strength, pain, fascial restrictions, Gross motor control, body mechanics, and UE functional use.   PLAN:  OT FREQUENCY: 2x/week  OT DURATION: 8 weeks  PLANNED INTERVENTIONS: self care/ADL training, therapeutic exercise, therapeutic activity, manual therapy, scar mobilization, passive range of motion, functional mobility training, electrical stimulation, ultrasound, moist heat, cryotherapy, patient/family education, coping strategies training, and DME and/or AE instructions  CONSULTED AND AGREED WITH PLAN OF CARE: Patient  PLAN FOR NEXT SESSION: manual therapy, A/ROM in standing, proximal shoulder strengthening, functional reaching into/out of cabinets, add loop band exercises and therapy ball exercises,    Trish Mage, OTR/L [phone] 08/14/2022, 9:08 AM

## 2022-09-05 ENCOUNTER — Encounter (HOSPITAL_COMMUNITY): Payer: Self-pay | Admitting: Occupational Therapy

## 2022-09-05 ENCOUNTER — Ambulatory Visit (HOSPITAL_COMMUNITY): Payer: BC Managed Care – PPO | Admitting: Occupational Therapy

## 2022-09-05 DIAGNOSIS — M25611 Stiffness of right shoulder, not elsewhere classified: Secondary | ICD-10-CM | POA: Diagnosis not present

## 2022-09-05 DIAGNOSIS — R29898 Other symptoms and signs involving the musculoskeletal system: Secondary | ICD-10-CM

## 2022-09-05 DIAGNOSIS — M25511 Pain in right shoulder: Secondary | ICD-10-CM | POA: Diagnosis not present

## 2022-09-05 NOTE — Therapy (Signed)
OUTPATIENT OCCUPATIONAL THERAPY ORTHO TREATMENT NOTE REASSESSMENT & RECERTIFICATION   Patient Name: Riley Larson MRN: [id number] DOB:10/26/1970, 52 y.o., male Today's Date: 09/05/2022  PCP: Assunta Found, MD REFERRING PROVIDER: Frederico Hamman, MD   END OF SESSION:  OT End of Session - 09/05/22 1028     Visit Number 21    Number of Visits 29    Date for OT Re-Evaluation 10/05/22    Authorization Type BCBS    OT Start Time [id number]    OT Stop Time 1026    OT Time Calculation (min) 38 min    Activity Tolerance Patient tolerated treatment well    Behavior During Therapy WFL for tasks assessed/performed              Past Medical History:  Diagnosis Date   Arthritis    Hypertension    Past Surgical History:  Procedure Laterality Date   BACK SURGERY     COLONOSCOPY WITH PROPOFOL N/A 03/14/2020   Procedure: COLONOSCOPY WITH PROPOFOL;  Surgeon: Dolores Frame, MD;  Location: [facility];  Service: Gastroenterology;  Laterality: N/A;  9:15   LAPAROSCOPIC GASTRIC BANDING     left index finger surgery      TOTAL KNEE ARTHROPLASTY Right 03/09/2021   Procedure: TOTAL KNEE ARTHROPLASTY;  Surgeon: Frederico Hamman, MD;  Location: [facility];  Service: Orthopedics;  Laterality: Right;   TOTAL KNEE ARTHROPLASTY Left 06/01/2021   Procedure: TOTAL KNEE ARTHROPLASTY;  Surgeon: Frederico Hamman, MD;  Location: [facility];  Service: Orthopedics;  Laterality: Left;   Patient Active Problem List   Diagnosis Date Noted   CELLULITIS, LEFT HAND 10/16/2009    ONSET DATE: 04/05/22  REFERRING DIAG: R shoulder Arthoplasty and rortator cuff repair  THERAPY DIAG:  Acute pain of right shoulder  Shoulder stiffness, right  Other symptoms and signs involving the musculoskeletal system  Rationale for Evaluation and Treatment: Rehabilitation  SUBJECTIVE:   SUBJECTIVE STATEMENT: "Will it ever get better?"  PERTINENT HISTORY: In 2023 pt was having a lot of pain with movements in his R  shoulder. He tore his rotator cuff pulling himself up off the ground. Pt had 3 injections prior to deciding on surgery.   PRECAUTIONS: Shoulder  WEIGHT BEARING RESTRICTIONS: Yes No weight at this time  PAIN:  Are you having pain? No  FALLS: Has patient fallen in last 6 months? No  PATIENT GOALS: To get back to normal  OBJECTIVE:   HAND DOMINANCE: Right  ADLs: Overall ADLs: Pt requiring increased time and up to min assist for dressing, bathing and grooming. All IADL's, pt has his wife assist/complete for him at this time.   FUNCTIONAL OUTCOME MEASURES: FOTO: 28.54 06/13/22: 55/100 07/05/22: 61.12/100 09/05/22: 61/100  UPPER EXTREMITY ROM:       Assessed supine, er/IR adducted  Passive ROM Right Eval Supine Right 06/13/22  Shoulder flexion 86 155  Shoulder abduction 101 160  Shoulder internal rotation 90 90  Shoulder external rotation 12 78  (Blank rows = not tested)   Assessed seated, er/IR adducted  Active ROM  Right 06/13/22 Right 07/05/22 Right 07/26/22 Right 09/05/22  Shoulder flexion  98 146 173 104  Shoulder abduction  77 156 165 134  Shoulder internal rotation  90 90 90 90  Shoulder external rotation  41 44 64 50  (Blank rows = not tested)  UPPER EXTREMITY MMT:     MMT Right eval Right 06/13/22 Right 07/05/22 Right 07/26/22 Right 09/05/22  Shoulder flexion   3/5 3/5  3+/5  Shoulder abduction   3/5 4-/5 3+/5  Shoulder adduction   5/5 5/5   Shoulder extension   4+/5 5/5   Shoulder internal rotation   4+/5 5/5 5/5  Shoulder external rotation   4-/5 4-/5 4-/5  Elbow flexion   4+/5 5/5   Elbow extension   4-/5 5/5   (Blank rows = not tested)   OBSERVATIONS: Severe fascial restriction noted in the trapezius, biceps, anterior shoulder girdle, scapula, and subscapularis   TODAY'S TREATMENT:                                                                                                                              DATE:  09/05/22 -Strengthening: supine,  2#-protraction, flexion, horizontal abduction, er/IR, abduction, 10 reps -Proximal shoulder strengthening: supine, 2#-paddles, criss cross, circles each direction, 10 reps each  -Sidelying strengthening: red band-protraction, flexion, er, horizontal abduction, 10 reps; discontinued abduction due to sharp pain -Ball on wall: 1' flexion, 1' abduction -Functional reaching: placing 10 cones on overhead shelf in flexion, removing in abduction -Overhead lacing: seated, lacing from top down then reversing -BodyCraft: row 20# plate, press 16# plate, 10 reps  08/14/22 -Ball rolls on the wall: flexion, abduction, x15 -Therapy Ball exercises: flexion, overhead press, protraction, v ups, circles both directions, x15 -Rebounder: yellow weighted ball x10 7ft, x10 28ft, blue weighted ball x10 63ft, x10 75ft -Functional Reaching: 4lb wrist weight in flexion counter to shoulder height, shoulder height to overhead x10 cones, 3lb wrist weight in abduction, overhead to chest height, chest height to counter x10 cones -Body Blade: flexion horizontal, abduction horizontal, flexion vertical, abduction vertical, x30" each -Shoulder Strengthening: blue band, flexion, abduction, er, IR, horizontal abduction, x15 -UBE: level 4, 3.5 KPH+, 2' forwards and backwards  08/07/22 -Shoulder Strengthening: 3lb dumbbells, flexion, abduction, protraction, horizontal abduction, er/IR, x15 -Farmers carry: 90 degree flexion, 4lb dumbbells, 60" -Goal Post Arms x15, 3lb dumbbells -X to V arms, x15, 3lb dumbbells -Pulls: green band, belly button level, chest level, forehead level, x10 each -PNF Strengthening: green band, er pulls, PNF up, PNF down, x15 -Shoulder Strengthening: green band, flexion, abduction (red), protraction, horizontal abduction, er/IR, x15 -Functional Reaching: 3lb wrist weight, x10 cones in flexion from counter to shoulder height, then to middle shelf, then overhead shelf, x10 cones in abduction from counter to shoulder  height, then to middle shelf    PATIENT EDUCATION: Education details: strengthening in supine, then reaching in standing Person educated: Patient Education method: Explanation, Demonstration, and Handouts Education comprehension: verbalized understanding and returned demonstration  HOME EXERCISE PROGRAM: 1/23: Table Slides 1/26: Thumb tacs 2/7: AA/ROM 2/20: Isometrics and Wall Slides 2/27: Stretches (er and biceps) 3/1: A/ROM 3/19: red scapular theraband; practice functional reaching 3/27: shoulder strengthening with dumbbells 3/29: Loop Band Exercises 4/2: Shoulder strengthening with theraband 4/8: PNF Strengthening 4/17: Therapy Ball Exercises 4/24: X to V arms and Goal Post Arms 5/1: Functional reaching in shelves 5/30: strengthening in supine, reaching  in standing  GOALS: Goals reviewed with patient? Yes  SHORT TERM GOALS: Target date: 05/31/22  Pt will decrease fascial restrictions to minimal amounts or less in the RUE, in order to improve mobility required for overhead reaching tasks.  Goal status: IN PROGRESS  2.  Pt will improve P/ROM to [facility] in RUE to improve ability to perform dressing tasks with minimal compensatory techniques.  Goal status: MET  3.  Pt will improve strength to 4/5 in RUE to improve ability to assist LUE with lifting objects needed during dressing, bathing, and grooming tasks. Goal status: IN PROGRESS   LONG TERM GOALS: Target date: 07/05/22  Pt will be provided and educated on HEP to improve mobility in RUE required for ADL completion.  Goal status: IN PROGRESS  2.  Pt will decrease pain in RUE to 3/10 or less in order to sleep for 3+ consecutive hours without waking due to pain.  Goal status: IN PROGRESS  3.  Pt will increase A/ROM of RUE to [facility] to improve ability to reach overhead and behind back during dressing and bathing tasks.  Goal status: IN PROGRESS  4.  Pt will increase strength in RUE to 5/5 to improve ability to perform  lifting tasks during cooking and cleaning, with no compensatory techniques.  Goal status: IN PROGRESS   ASSESSMENT:  CLINICAL IMPRESSION: Pt reports he has continued difficulty reaching past the 50% range, has consistent soreness/ache, and has a lot of muscle fatigue with shaking. Pt has better days and does try to do more on those days, but does not have consistent good days with improved functional use. Also has intermittent popping. Reassessment completed this session, pt has not met any new goals and some A/ROM measurements have declined since pt has had approximately 3 weeks without therapy. This session, completed strengthening in gravity eliminated plane first, then progressed to A/ROM in standing. After strengthening in supine, pt with improved ability to perform functional reaching activity. Discussed progress with pt, recommend continued therapy for strengthening. Pt will see MD on Thursday and discuss, then will return to therapy on Friday to determine if going to continue or progress to HEP.   PERFORMANCE DEFICITS: in functional skills including ADLs, IADLs, ROM, strength, pain, fascial restrictions, Gross motor control, body mechanics, and UE functional use.   PLAN:  OT FREQUENCY: 2x/week  OT DURATION: 4 weeks  PLANNED INTERVENTIONS: self care/ADL training, therapeutic exercise, therapeutic activity, manual therapy, scar mobilization, passive range of motion, functional mobility training, electrical stimulation, ultrasound, moist heat, cryotherapy, patient/family education, coping strategies training, and DME and/or AE instructions  CONSULTED AND AGREED WITH PLAN OF CARE: Patient  PLAN FOR NEXT SESSION: Follow up on MD appt, continue with gravity eliminated strengthening, standing functional reaching, strengthening, shoulder stability work   Ezra Sites, OTR/L  [phone] 09/05/2022, 10:29 AM

## 2022-09-12 DIAGNOSIS — M19011 Primary osteoarthritis, right shoulder: Secondary | ICD-10-CM | POA: Diagnosis not present

## 2022-09-17 ENCOUNTER — Ambulatory Visit (HOSPITAL_COMMUNITY): Payer: BC Managed Care – PPO | Attending: Orthopedic Surgery | Admitting: Occupational Therapy

## 2022-09-17 ENCOUNTER — Encounter (HOSPITAL_COMMUNITY): Payer: Self-pay | Admitting: Occupational Therapy

## 2022-09-17 DIAGNOSIS — M25611 Stiffness of right shoulder, not elsewhere classified: Secondary | ICD-10-CM | POA: Diagnosis not present

## 2022-09-17 DIAGNOSIS — M25511 Pain in right shoulder: Secondary | ICD-10-CM | POA: Diagnosis not present

## 2022-09-17 DIAGNOSIS — R29898 Other symptoms and signs involving the musculoskeletal system: Secondary | ICD-10-CM | POA: Insufficient documentation

## 2022-09-17 NOTE — Therapy (Signed)
OUTPATIENT OCCUPATIONAL THERAPY ORTHO TREATMENT NOTE   Patient Name: Riley Larson MRN: [id number] DOB:July 25, 1970, 52 y.o., male Today's Date: 09/17/2022  PCP: Assunta Found, MD REFERRING PROVIDER: Frederico Hamman, MD   END OF SESSION:  OT End of Session - 09/17/22 1033     Visit Number 22    Number of Visits 29    Date for OT Re-Evaluation 10/05/22    Authorization Type BCBS    OT Start Time 0950    OT Stop Time 1030    OT Time Calculation (min) 40 min    Activity Tolerance Patient tolerated treatment well    Behavior During Therapy WFL for tasks assessed/performed               Past Medical History:  Diagnosis Date   Arthritis    Hypertension    Past Surgical History:  Procedure Laterality Date   BACK SURGERY     COLONOSCOPY WITH PROPOFOL N/A 03/14/2020   Procedure: COLONOSCOPY WITH PROPOFOL;  Surgeon: Dolores Frame, MD;  Location: [facility];  Service: Gastroenterology;  Laterality: N/A;  9:15   LAPAROSCOPIC GASTRIC BANDING     left index finger surgery      TOTAL KNEE ARTHROPLASTY Right 03/09/2021   Procedure: TOTAL KNEE ARTHROPLASTY;  Surgeon: Frederico Hamman, MD;  Location: [facility];  Service: Orthopedics;  Laterality: Right;   TOTAL KNEE ARTHROPLASTY Left 06/01/2021   Procedure: TOTAL KNEE ARTHROPLASTY;  Surgeon: Frederico Hamman, MD;  Location: [facility];  Service: Orthopedics;  Laterality: Left;   Patient Active Problem List   Diagnosis Date Noted   CELLULITIS, LEFT HAND 10/16/2009    ONSET DATE: 04/05/22  REFERRING DIAG: R shoulder Arthoplasty and rortator cuff repair  THERAPY DIAG:  Other symptoms and signs involving the musculoskeletal system  Shoulder stiffness, right  Acute pain of right shoulder  Rationale for Evaluation and Treatment: Rehabilitation  SUBJECTIVE:   SUBJECTIVE STATEMENT: S: It woke me up last night, I think the way I slept on it.   PERTINENT HISTORY: In 2023 pt was having a lot of pain with movements in his  R shoulder. He tore his rotator cuff pulling himself up off the ground. Pt had 3 injections prior to deciding on surgery.   PRECAUTIONS: Shoulder  WEIGHT BEARING RESTRICTIONS: Yes No weight at this time  PAIN:  Are you having pain? Yes: NPRS scale: 4/10 Pain location: right shoulder Pain description: sore Aggravating factors: movement, sleeping on it Relieving factors: rest  FALLS: Has patient fallen in last 6 months? No  PATIENT GOALS: To get back to normal  OBJECTIVE:   HAND DOMINANCE: Right  ADLs: Overall ADLs: Pt requiring increased time and up to min assist for dressing, bathing and grooming. All IADL's, pt has his wife assist/complete for him at this time.   FUNCTIONAL OUTCOME MEASURES: FOTO: 28.54 06/13/22: 55/100 07/05/22: 61.12/100 09/05/22: 61/100  UPPER EXTREMITY ROM:       Assessed supine, er/IR adducted  Passive ROM Right Eval Supine Right 06/13/22  Shoulder flexion 86 155  Shoulder abduction 101 160  Shoulder internal rotation 90 90  Shoulder external rotation 12 78  (Blank rows = not tested)   Assessed seated, er/IR adducted  Active ROM  Right 06/13/22 Right 07/05/22 Right 07/26/22 Right 09/05/22  Shoulder flexion  98 146 173 104  Shoulder abduction  77 156 165 134  Shoulder internal rotation  90 90 90 90  Shoulder external rotation  41 44 64 50  (Blank rows =  not tested)  UPPER EXTREMITY MMT:     MMT Right eval Right 06/13/22 Right 07/05/22 Right 07/26/22 Right 09/05/22  Shoulder flexion   3/5 3/5 3+/5  Shoulder abduction   3/5 4-/5 3+/5  Shoulder adduction   5/5 5/5   Shoulder extension   4+/5 5/5   Shoulder internal rotation   4+/5 5/5 5/5  Shoulder external rotation   4-/5 4-/5 4-/5  Elbow flexion   4+/5 5/5   Elbow extension   4-/5 5/5   (Blank rows = not tested)   OBSERVATIONS: Severe fascial restriction noted in the trapezius, biceps, anterior shoulder girdle, scapula, and subscapularis   TODAY'S TREATMENT:                                                                                                                               DATE:  09/17/22 -Myofascial release to right upper trapezius and scapular regions to decrease pain and fascial restrictions and increase joint ROM -Strengthening: supine, 2#-protraction, flexion, horizontal abduction, er/IR, abduction, 12 reps -Proximal shoulder strengthening: supine, 2#-paddles, criss cross, circles each direction, 12 reps each  -Sidelying strengthening: red band-protraction, flexion, er, horizontal abduction, 10 reps; discontinued abduction due to sharp pain -Ball on wall: 1' flexion, 1' abduction -ABC writing: shoulder at 90 degrees flexion -BodyCraft: row 30# plate, press 16# plate, 10 reps -Strengthening: holding basketball-flexion 90 degrees to overhead and back to 90, overhead press, circles each direction, 10 reps each -Modified push-ups: standing at treadmill, 10 reps wide arm stance -Overhead lacing: seated, lacing from top down then reversing  09/05/22 -Strengthening: supine, 2#-protraction, flexion, horizontal abduction, er/IR, abduction, 10 reps -Proximal shoulder strengthening: supine, 2#-paddles, criss cross, circles each direction, 10 reps each  -Sidelying strengthening: red band-protraction, flexion, er, horizontal abduction, 10 reps; discontinued abduction due to sharp pain -Ball on wall: 1' flexion, 1' abduction -Functional reaching: placing 10 cones on overhead shelf in flexion, removing in abduction -Overhead lacing: seated, lacing from top down then reversing -BodyCraft: row 20# plate, press 10# plate, 10 reps  08/14/22 -Ball rolls on the wall: flexion, abduction, x15 -Therapy Ball exercises: flexion, overhead press, protraction, v ups, circles both directions, x15 -Rebounder: yellow weighted ball x10 65ft, x10 38ft, blue weighted ball x10 29ft, x10 15ft -Functional Reaching: 4lb wrist weight in flexion counter to shoulder height, shoulder height to overhead  x10 cones, 3lb wrist weight in abduction, overhead to chest height, chest height to counter x10 cones -Body Blade: flexion horizontal, abduction horizontal, flexion vertical, abduction vertical, x30" each -Shoulder Strengthening: blue band, flexion, abduction, er, IR, horizontal abduction, x15 -UBE: level 4, 3.5 KPH+, 2' forwards and backwards   PATIENT EDUCATION: Education details: can add modified push-ups to routine Person educated: Patient Education method: Explanation, Demonstration, and Handouts Education comprehension: verbalized understanding and returned demonstration  HOME EXERCISE PROGRAM: 1/23: Table Slides 1/26: Thumb tacs 2/7: AA/ROM 2/20: Isometrics and Wall Slides 2/27: Stretches (er and biceps) 3/1: A/ROM 3/19: red  scapular theraband; practice functional reaching 3/27: shoulder strengthening with dumbbells 3/29: Loop Band Exercises 4/2: Shoulder strengthening with theraband 4/8: PNF Strengthening 4/17: Therapy Ball Exercises 4/24: X to V arms and Goal Post Arms 5/1: Functional reaching in shelves 5/30: strengthening in supine, reaching in standing  GOALS: Goals reviewed with patient? Yes  SHORT TERM GOALS: Target date: 05/31/22  Pt will decrease fascial restrictions to minimal amounts or less in the RUE, in order to improve mobility required for overhead reaching tasks.  Goal status: IN PROGRESS  2.  Pt will improve P/ROM to [facility] in RUE to improve ability to perform dressing tasks with minimal compensatory techniques.  Goal status: MET  3.  Pt will improve strength to 4/5 in RUE to improve ability to assist LUE with lifting objects needed during dressing, bathing, and grooming tasks. Goal status: IN PROGRESS   LONG TERM GOALS: Target date: 07/05/22  Pt will be provided and educated on HEP to improve mobility in RUE required for ADL completion.  Goal status: IN PROGRESS  2.  Pt will decrease pain in RUE to 3/10 or less in order to sleep for 3+ consecutive  hours without waking due to pain.  Goal status: IN PROGRESS  3.  Pt will increase A/ROM of RUE to [facility] to improve ability to reach overhead and behind back during dressing and bathing tasks.  Goal status: IN PROGRESS  4.  Pt will increase strength in RUE to 5/5 to improve ability to perform lifting tasks during cooking and cleaning, with no compensatory techniques.  Goal status: IN PROGRESS   ASSESSMENT:  CLINICAL IMPRESSION: Pt reports he has completed his HEP some, still has soreness and weakness but no sharp pains. Pt with muscle tightness at trapezius, anterior shoulder and upper arm are much improved with minimal tightness. Continued with strengthening today, using 2# weights and red bands. Added ABC writing, modified push ups and therapy ball strengthening with focus on 90 degrees to overhead versus going through full range. Verbal cuing for form and technique, mod fatigue at end of session.   PERFORMANCE DEFICITS: in functional skills including ADLs, IADLs, ROM, strength, pain, fascial restrictions, Gross motor control, body mechanics, and UE functional use.   PLAN:  OT FREQUENCY: 2x/week  OT DURATION: 4 weeks  PLANNED INTERVENTIONS: self care/ADL training, therapeutic exercise, therapeutic activity, manual therapy, scar mobilization, passive range of motion, functional mobility training, electrical stimulation, ultrasound, moist heat, cryotherapy, patient/family education, coping strategies training, and DME and/or AE instructions  CONSULTED AND AGREED WITH PLAN OF CARE: Patient  PLAN FOR NEXT SESSION: continue with gravity eliminated strengthening, standing functional reaching, strengthening, shoulder stability work   Ezra Sites, OTR/L  [phone] 09/17/2022, 10:33 AM

## 2022-10-02 ENCOUNTER — Encounter (HOSPITAL_COMMUNITY): Payer: BC Managed Care – PPO | Admitting: Occupational Therapy

## 2022-10-04 ENCOUNTER — Encounter (HOSPITAL_COMMUNITY): Payer: Self-pay | Admitting: Occupational Therapy

## 2022-10-04 ENCOUNTER — Ambulatory Visit (HOSPITAL_COMMUNITY): Payer: BC Managed Care – PPO | Admitting: Occupational Therapy

## 2022-10-04 DIAGNOSIS — M25511 Pain in right shoulder: Secondary | ICD-10-CM

## 2022-10-04 DIAGNOSIS — M25611 Stiffness of right shoulder, not elsewhere classified: Secondary | ICD-10-CM | POA: Diagnosis not present

## 2022-10-04 DIAGNOSIS — R29898 Other symptoms and signs involving the musculoskeletal system: Secondary | ICD-10-CM | POA: Diagnosis not present

## 2022-10-04 NOTE — Therapy (Addendum)
OUTPATIENT OCCUPATIONAL THERAPY ORTHO REASSESSMENT & TREATMENT NOTE RECERTIFICATION  Patient Name: Riley Larson MRN: [id number] DOB:02-12-1971, 52 y.o., male Today's Date: 10/04/2022  PCP: Assunta Found, MD REFERRING PROVIDER: Frederico Hamman, MD   OCCUPATIONAL THERAPY DISCHARGE SUMMARY  Visits from Start of Care: 23  Current functional level related to goals / functional outcomes: Unknown. Pt did not contact clinic after MD appt to schedule additional appts or discharge. At last visit on 6/28 pt had met 2/3 STGs and 1/4 LTGs. Pt with improvement in pain, functional use, and sleep. Does have occasional sharp pains at times, random with movement. Pt is primarily limited in strength. Has great joint mobility, but is limited in achieving full ROM due to strength.    Remaining deficits: Decreased strength and activity tolerance   Education / Equipment: HEP for continued strengthening   Patient agrees to discharge. Patient goals were partially met. Patient is being discharged due to not returning since the last visit..      END OF SESSION:  OT End of Session - 10/04/22 1027     Visit Number 23    Number of Visits 29    Date for OT Re-Evaluation 11/03/22    Authorization Type 1) [employer] 2) [facility] [employer]    OT Start Time [phone]    OT Stop Time 1025    OT Time Calculation (min) 38 min    Activity Tolerance Patient tolerated treatment well    Behavior During Therapy WFL for tasks assessed/performed                Past Medical History:  Diagnosis Date   Arthritis    Hypertension    Past Surgical History:  Procedure Laterality Date   BACK SURGERY     COLONOSCOPY WITH PROPOFOL N/A 03/14/2020   Procedure: COLONOSCOPY WITH PROPOFOL;  Surgeon: Dolores Frame, MD;  Location: [facility];  Service: Gastroenterology;  Laterality: N/A;  9:15   LAPAROSCOPIC GASTRIC BANDING     left index finger surgery      TOTAL KNEE ARTHROPLASTY Right 03/09/2021   Procedure: TOTAL  KNEE ARTHROPLASTY;  Surgeon: Frederico Hamman, MD;  Location: [facility];  Service: Orthopedics;  Laterality: Right;   TOTAL KNEE ARTHROPLASTY Left 06/01/2021   Procedure: TOTAL KNEE ARTHROPLASTY;  Surgeon: Frederico Hamman, MD;  Location: [facility];  Service: Orthopedics;  Laterality: Left;   Patient Active Problem List   Diagnosis Date Noted   CELLULITIS, LEFT HAND 10/16/2009    ONSET DATE: 04/05/22  REFERRING DIAG: R shoulder Arthoplasty and rortator cuff repair  THERAPY DIAG:  Other symptoms and signs involving the musculoskeletal system  Shoulder stiffness, right  Acute pain of right shoulder  Rationale for Evaluation and Treatment: Rehabilitation  SUBJECTIVE:   SUBJECTIVE STATEMENT: S: I fished a bunch.  PERTINENT HISTORY: In 2023 pt was having a lot of pain with movements in his R shoulder. He tore his rotator cuff pulling himself up off the ground. Pt had 3 injections prior to deciding on surgery.   PRECAUTIONS: Shoulder  WEIGHT BEARING RESTRICTIONS: Yes No weight at this time  PAIN:  Are you having pain? No  FALLS: Has patient fallen in last 6 months? No  PATIENT GOALS: To get back to normal  OBJECTIVE:   HAND DOMINANCE: Right  ADLs: Overall ADLs: Pt requiring increased time and up to min assist for dressing, bathing and grooming. All IADL's, pt has his wife assist/complete for him at this time.   FUNCTIONAL OUTCOME MEASURES: FOTO: 28.54  06/13/22: 55/100 07/05/22: 61.12/100 09/05/22: 61/100 10/04/22: 61/100  UPPER EXTREMITY ROM:       Assessed supine, er/IR adducted  Passive ROM Right Eval Supine Right 06/13/22 Right 10/04/22  Shoulder flexion 86 155 170  Shoulder abduction 101 160 175  Shoulder internal rotation 90 90 90  Shoulder external rotation 12 78 78  (Blank rows = not tested)   Assessed seated, er/IR adducted  Active ROM  Right 06/13/22 Right 07/05/22 Right 07/26/22 Right 09/05/22 Right 10/04/22  Shoulder flexion  98 146 173 104 120  Shoulder  abduction  77 156 165 134 164  Shoulder internal rotation  90 90 90 90 90  Shoulder external rotation  41 44 64 50 52  (Blank rows = not tested)  UPPER EXTREMITY MMT:     MMT Right eval Right 06/13/22 Right 07/05/22 Right 07/26/22 Right 09/05/22 Right 10/04/22  Shoulder flexion   3/5 3/5 3+/5 4-/5  Shoulder abduction   3/5 4-/5 3+/5 4/5  Shoulder adduction   5/5 5/5    Shoulder extension   4+/5 5/5    Shoulder internal rotation   4+/5 5/5 5/5 5/5  Shoulder external rotation   4-/5 4-/5 4-/5 4+/5  Elbow flexion   4+/5 5/5    Elbow extension   4-/5 5/5    (Blank rows = not tested)   OBSERVATIONS: Severe fascial restriction noted in the trapezius, biceps, anterior shoulder girdle, scapula, and subscapularis   TODAY'S TREATMENT:                                                                                                                              DATE:  10/04/22 -Strengthening: supine, 2#-protraction, flexion, horizontal abduction, er/IR, abduction, 12 reps -Proximal shoulder strengthening: supine, 2#-paddles, criss cross, circles each direction, 12 reps each  -Therapy ball strengthening: circles each direction, flexion, chest press, overhead press, 10 reps each -Ball on wall: 1' flexion, 1' abduction -ABC writing: shoulder at 90 degrees flexion -Theraband strengthening: red-protraction, flexion, abduction, horizontal abduction, er, 10 reps -Overhead lacing: seated, lacing from top down then reversing -BodyCraft: row 30# plate, press 60# plate, 10 reps -x to v arms: 10 reps -A/ROM: flexion-90 degrees to full range, 10 reps  09/17/22 -Myofascial release to right upper trapezius and scapular regions to decrease pain and fascial restrictions and increase joint ROM -Strengthening: supine, 2#-protraction, flexion, horizontal abduction, er/IR, abduction, 12 reps -Proximal shoulder strengthening: supine, 2#-paddles, criss cross, circles each direction, 12 reps each  -Sidelying  strengthening: red band-protraction, flexion, er, horizontal abduction, 10 reps; discontinued abduction due to sharp pain -Ball on wall: 1' flexion, 1' abduction -ABC writing: shoulder at 90 degrees flexion -BodyCraft: row 30# plate, press 45# plate, 10 reps -Strengthening: holding basketball-flexion 90 degrees to overhead and back to 90, overhead press, circles each direction, 10 reps each -Modified push-ups: standing at treadmill, 10 reps wide arm stance -Overhead lacing: seated, lacing from top down then reversing  09/05/22 -Strengthening: supine,  2#-protraction, flexion, horizontal abduction, er/IR, abduction, 10 reps -Proximal shoulder strengthening: supine, 2#-paddles, criss cross, circles each direction, 10 reps each  -Sidelying strengthening: red band-protraction, flexion, er, horizontal abduction, 10 reps; discontinued abduction due to sharp pain -Ball on wall: 1' flexion, 1' abduction -Functional reaching: placing 10 cones on overhead shelf in flexion, removing in abduction -Overhead lacing: seated, lacing from top down then reversing -BodyCraft: row 20# plate, press 16# plate, 10 reps    PATIENT EDUCATION: Education details: reviewed HEP Person educated: Patient Education method: Programmer, multimedia, Demonstration, and Handouts Education comprehension: verbalized understanding and returned demonstration  HOME EXERCISE PROGRAM: 1/23: Table Slides 1/26: Thumb tacs 2/7: AA/ROM 2/20: Isometrics and Wall Slides 2/27: Stretches (er and biceps) 3/1: A/ROM 3/19: red scapular theraband; practice functional reaching 3/27: shoulder strengthening with dumbbells 3/29: Loop Band Exercises 4/2: Shoulder strengthening with theraband 4/8: PNF Strengthening 4/17: Therapy Ball Exercises 4/24: X to V arms and Goal Post Arms 5/1: Functional reaching in shelves 5/30: strengthening in supine, reaching in standing  GOALS: Goals reviewed with patient? Yes  SHORT TERM GOALS: Target date:  05/31/22  Pt will decrease fascial restrictions to minimal amounts or less in the RUE, in order to improve mobility required for overhead reaching tasks.  Goal status: MET  2.  Pt will improve P/ROM to [facility] in RUE to improve ability to perform dressing tasks with minimal compensatory techniques.  Goal status: MET  3.  Pt will improve strength to 4/5 in RUE to improve ability to assist LUE with lifting objects needed during dressing, bathing, and grooming tasks.  Goal status: IN PROGRESS   LONG TERM GOALS: Target date: 07/05/22  Pt will be provided and educated on HEP to improve mobility in RUE required for ADL completion.  Goal status: IN PROGRESS  2.  Pt will decrease pain in RUE to 3/10 or less in order to sleep for 3+ consecutive hours without waking due to pain.  Goal status: MET  3.  Pt will increase A/ROM of RUE to [facility] to improve ability to reach overhead and behind back during dressing and bathing tasks.  Goal status: IN PROGRESS  4.  Pt will increase strength in RUE to 5/5 to improve ability to perform lifting tasks during cooking and cleaning, with no compensatory techniques.   Goal status: IN PROGRESS   ASSESSMENT:  CLINICAL IMPRESSION: Pt reports he does not have pain or soreness today, was able fish without much difficulty. Reassessment completed this session, pt has met 2/3 STGs and 1/4 LTGs. Pt with improvement in pain, functional use, and sleep. Does have occasional sharp pains at times, random with movement. Pt is primarily limited in strength. Has great joint mobility, but is limited in achieving full ROM due to strength. Continued with strengthening this session, began in supine to allow for greater activity tolerance. Pt completing therapy ball strengthening with mod difficulty achieving full elbow extension, intermittent compensation with momentum. Pt was able to complete all theraband tasks without sharp pains today. Continued with shoulder strengthening and  stability, pt with mod fatigue. Verbal cuing for form and technique. Pt would benefit from continued skilled OT services to progress strengthening and functional use of RUE. Pt has MD appt on 7/9, will discuss with MD and determine if he will continue or discharge with HEP.   PERFORMANCE DEFICITS: in functional skills including ADLs, IADLs, ROM, strength, pain, fascial restrictions, Gross motor control, body mechanics, and UE functional use.   PLAN:  OT FREQUENCY: 1x/week  OT DURATION:  4 weeks  PLANNED INTERVENTIONS: self care/ADL training, therapeutic exercise, therapeutic activity, manual therapy, scar mobilization, passive range of motion, functional mobility training, electrical stimulation, ultrasound, moist heat, cryotherapy, patient/family education, coping strategies training, and DME and/or AE instructions  CONSULTED AND AGREED WITH PLAN OF CARE: Patient  PLAN FOR NEXT SESSION: hold until MD appt on 7/9-pt will let us know if returning or discharging   Ezra Sites, OTR/L  [phone] 10/04/2022, 10:28 AM

## 2022-10-22 DIAGNOSIS — M19011 Primary osteoarthritis, right shoulder: Secondary | ICD-10-CM | POA: Diagnosis not present

## 2023-04-22 DIAGNOSIS — B070 Plantar wart: Secondary | ICD-10-CM | POA: Diagnosis not present

## 2023-04-22 DIAGNOSIS — Q828 Other specified congenital malformations of skin: Secondary | ICD-10-CM | POA: Diagnosis not present

## 2023-05-13 DIAGNOSIS — Q828 Other specified congenital malformations of skin: Secondary | ICD-10-CM | POA: Diagnosis not present

## 2023-05-13 DIAGNOSIS — B070 Plantar wart: Secondary | ICD-10-CM | POA: Diagnosis not present

## 2023-06-03 DIAGNOSIS — B070 Plantar wart: Secondary | ICD-10-CM | POA: Diagnosis not present

## 2023-09-08 DIAGNOSIS — L814 Other melanin hyperpigmentation: Secondary | ICD-10-CM | POA: Diagnosis not present

## 2023-09-08 DIAGNOSIS — L853 Xerosis cutis: Secondary | ICD-10-CM | POA: Diagnosis not present

## 2023-09-08 DIAGNOSIS — L568 Other specified acute skin changes due to ultraviolet radiation: Secondary | ICD-10-CM | POA: Diagnosis not present

## 2023-09-08 DIAGNOSIS — A630 Anogenital (venereal) warts: Secondary | ICD-10-CM | POA: Diagnosis not present

## 2023-09-08 DIAGNOSIS — L570 Actinic keratosis: Secondary | ICD-10-CM | POA: Diagnosis not present

## 2023-09-22 DIAGNOSIS — L570 Actinic keratosis: Secondary | ICD-10-CM | POA: Diagnosis not present

## 2023-09-22 DIAGNOSIS — L568 Other specified acute skin changes due to ultraviolet radiation: Secondary | ICD-10-CM | POA: Diagnosis not present

## 2023-09-22 DIAGNOSIS — L814 Other melanin hyperpigmentation: Secondary | ICD-10-CM | POA: Diagnosis not present

## 2023-09-22 DIAGNOSIS — A630 Anogenital (venereal) warts: Secondary | ICD-10-CM | POA: Diagnosis not present

## 2023-09-28 ENCOUNTER — Emergency Department (HOSPITAL_COMMUNITY)
Admission: EM | Admit: 2023-09-28 | Discharge: 2023-09-28 | Disposition: A | Discharge status: Home / Self Care | Attending: Student | Admitting: Student

## 2023-09-28 ENCOUNTER — Other Ambulatory Visit: Payer: Self-pay

## 2023-09-28 ENCOUNTER — Encounter (HOSPITAL_COMMUNITY): Payer: Self-pay | Admitting: Emergency Medicine

## 2023-09-28 ENCOUNTER — Emergency Department (HOSPITAL_COMMUNITY)

## 2023-09-28 DIAGNOSIS — K5792 Diverticulitis of intestine, part unspecified, without perforation or abscess without bleeding: Secondary | ICD-10-CM

## 2023-09-28 DIAGNOSIS — Z79899 Other long term (current) drug therapy: Secondary | ICD-10-CM | POA: Diagnosis not present

## 2023-09-28 DIAGNOSIS — I10 Essential (primary) hypertension: Secondary | ICD-10-CM | POA: Diagnosis not present

## 2023-09-28 DIAGNOSIS — R1032 Left lower quadrant pain: Secondary | ICD-10-CM | POA: Diagnosis not present

## 2023-09-28 DIAGNOSIS — K5732 Diverticulitis of large intestine without perforation or abscess without bleeding: Secondary | ICD-10-CM | POA: Insufficient documentation

## 2023-09-28 LAB — URINALYSIS, ROUTINE W REFLEX MICROSCOPIC
Bilirubin Urine: NEGATIVE
Glucose, UA: NEGATIVE mg/dL
Hgb urine dipstick: NEGATIVE
Ketones, ur: NEGATIVE mg/dL
Leukocytes,Ua: NEGATIVE
Nitrite: NEGATIVE
Protein, ur: NEGATIVE mg/dL
Specific Gravity, Urine: 1.002 — ABNORMAL LOW (ref 1.005–1.030)
pH: 6 (ref 5.0–8.0)

## 2023-09-28 LAB — LIPASE, BLOOD: Lipase: 30 U/L (ref 11–51)

## 2023-09-28 LAB — COMPREHENSIVE METABOLIC PANEL WITH GFR
ALT: 14 U/L (ref 0–44)
AST: 15 U/L (ref 15–41)
Albumin: 4.2 g/dL (ref 3.5–5.0)
Alkaline Phosphatase: 52 U/L (ref 38–126)
Anion gap: 12 (ref 5–15)
BUN: 6 mg/dL (ref 6–20)
CO2: 25 mmol/L (ref 22–32)
Calcium: 9.5 mg/dL (ref 8.9–10.3)
Chloride: 96 mmol/L — ABNORMAL LOW (ref 98–111)
Creatinine, Ser: 0.74 mg/dL (ref 0.61–1.24)
GFR, Estimated: >60 mL/min (ref >60)
Glucose, Bld: 106 mg/dL — ABNORMAL HIGH (ref 70–99)
Potassium: 4.6 mmol/L (ref 3.5–5.1)
Sodium: 133 mmol/L — ABNORMAL LOW (ref 135–145)
Total Bilirubin: 0.9 mg/dL (ref 0.0–1.2)
Total Protein: 7.9 g/dL (ref 6.5–8.1)

## 2023-09-28 LAB — CBC
HCT: 44.6 % (ref 39.0–52.0)
Hemoglobin: 15.4 g/dL (ref 13.0–17.0)
MCH: 33 pg (ref 26.0–34.0)
MCHC: 34.5 g/dL (ref 30.0–36.0)
MCV: 95.5 fL (ref 80.0–100.0)
Platelets: 317 10*3/uL (ref 150–400)
RBC: 4.67 MIL/uL (ref 4.22–5.81)
RDW: 13.1 % (ref 11.5–15.5)
WBC: 10.9 10*3/uL — ABNORMAL HIGH (ref 4.0–10.5)
nRBC: 0 % (ref 0.0–0.2)

## 2023-09-28 MED ORDER — AMOXICILLIN-POT CLAVULANATE 875-125 MG PO TABS: 1.0000 | ORAL_TABLET | Freq: Two times a day (BID) | ORAL | 0 refills | Status: AC

## 2023-09-28 MED ORDER — AMOXICILLIN-POT CLAVULANATE 875-125 MG PO TABS
1.0000 | ORAL_TABLET | Freq: Once | ORAL | Status: AC
  Administered 2023-09-28: 1 via ORAL
  Filled 2023-09-28: qty 1

## 2023-09-28 MED ORDER — IOHEXOL 300 MG/ML  SOLN
100.0000 mL | Freq: Once | INTRAMUSCULAR | Status: AC | PRN
  Administered 2023-09-28: 100 mL via INTRAVENOUS

## 2023-09-28 NOTE — ED Triage Notes (Signed)
 Pt reports left sided lower abd pain x 2-3 days with intermittent diarrhea, LBM this morning, no urinary symptoms, n/v

## 2023-09-28 NOTE — ED Provider Notes (Signed)
 [place] EMERGENCY DEPARTMENT AT [facility] Provider Note  CSN: [id number] Arrival date & time: 09/28/23 9047  Chief Complaint(s) Abdominal Pain  HPI Riley Larson is a 53 y.o. male with PMH HTN, arthritis who presents emergency room for evaluation of abdominal pain.  States that over the last 3 days she has had persistent left lower quadrant abdominal pain but no associated nausea, vomiting, chest pain, shortness of breath or other systemic symptoms.  States that pain comes and goes but is severe when it occurs.   Past Medical History Past Medical History:  Diagnosis Date   Arthritis    Hypertension    Patient Active Problem List   Diagnosis Date Noted   CELLULITIS, LEFT HAND 10/16/2009   Home Medication(s) Prior to Admission medications   Medication Sig Start Date End Date Taking? Authorizing Provider  amoxicillin-clavulanate (AUGMENTIN) 875-125 MG tablet Take 1 tablet by mouth every 12 (twelve) hours. 09/28/23  Yes Shavonda Wiedman, MD  allopurinol (ZYLOPRIM) 300 MG tablet Take 300 mg by mouth daily. 01/11/21   [provider]  colchicine 0.6 MG tablet Take 0.6 mg by mouth 2 (two) times daily as needed (gout).  01/26/20   [provider]  lisinopril-hydrochlorothiazide (ZESTORETIC) 20-12.5 MG tablet Take 1 tablet by mouth daily.  01/11/20   [provider]  methocarbamol  (ROBAXIN ) 500 MG tablet Take 1 tablet (500 mg total) by mouth every 6 (six) hours as needed for muscle spasms. 04/05/22   Chadwell, Joshua, PA-C  oxyCODONE  (OXY IR/ROXICODONE ) 5 MG immediate release tablet Take one tab po q4-6hrs prn pain 04/05/22   Chadwell, Joshua, PA-C  sildenafil (REVATIO) 20 MG tablet Take 60 mg by mouth daily as needed (ED).  01/14/20   [provider]                                                                                                                                    Past Surgical History Past Surgical History:  Procedure  Laterality Date   BACK SURGERY     COLONOSCOPY WITH PROPOFOL  N/A 03/14/2020   Procedure: COLONOSCOPY WITH PROPOFOL ;  Surgeon: Eartha Angelia Sieving, MD;  Location: [facility];  Service: Gastroenterology;  Laterality: N/A;  9:15   LAPAROSCOPIC GASTRIC BANDING     left index finger surgery      TOTAL KNEE ARTHROPLASTY Right 03/09/2021   Procedure: TOTAL KNEE ARTHROPLASTY;  Surgeon: Shari Sieving, MD;  Location: [facility];  Service: Orthopedics;  Laterality: Right;   TOTAL KNEE ARTHROPLASTY Left 06/01/2021   Procedure: TOTAL KNEE ARTHROPLASTY;  Surgeon: Shari Sieving, MD;  Location: [facility];  Service: Orthopedics;  Laterality: Left;   Family History History reviewed. No pertinent family history.  Social History Social History   Tobacco Use   Smoking status: Never   Smokeless tobacco: Former  Building services engineer status: Never Used  Substance Use Topics   Alcohol use: Yes  Alcohol/week: 2.0 standard drinks of alcohol    Types: 2 Standard drinks or equivalent per week    Comment: occas   Drug use: Never   Allergies Patient has no known allergies.  Review of Systems Review of Systems  Gastrointestinal:  Positive for abdominal pain.    Physical Exam Vital Signs  I have reviewed the triage vital signs BP (!) 144/86 (BP Location: Left Arm)   Pulse 67   Temp 98.9 F (37.2 C) (Oral)   Resp 17   Ht 5' 10 (1.778 m)   Wt 103.4 kg   SpO2 98%   BMI 32.71 kg/m   Physical Exam Constitutional:      General: He is not in acute distress.    Appearance: Normal appearance.  HENT:     Head: Normocephalic and atraumatic.     Nose: No congestion or rhinorrhea.   Eyes:     General:        Right eye: No discharge.        Left eye: No discharge.     Extraocular Movements: Extraocular movements intact.     Pupils: Pupils are equal, round, and reactive to light.    Cardiovascular:     Rate and Rhythm: Normal rate and regular rhythm.     Heart sounds: No murmur  heard. Pulmonary:     Effort: No respiratory distress.     Breath sounds: No wheezing or rales.  Abdominal:     General: There is no distension.     Tenderness: There is abdominal tenderness in the left lower quadrant.   Musculoskeletal:        General: Normal range of motion.     Cervical back: Normal range of motion.   Skin:    General: Skin is warm and dry.   Neurological:     General: No focal deficit present.     Mental Status: He is alert.     ED Results and Treatments Labs (all labs ordered are listed, but only abnormal results are displayed) Labs Reviewed  COMPREHENSIVE METABOLIC PANEL WITH GFR - Abnormal; Notable for the following components:      Result Value   Sodium 133 (*)    Chloride 96 (*)    Glucose, Bld 106 (*)    All other components within normal limits  CBC - Abnormal; Notable for the following components:   WBC 10.9 (*)    All other components within normal limits  URINALYSIS, ROUTINE W REFLEX MICROSCOPIC - Abnormal; Notable for the following components:   Color, Urine STRAW (*)    Specific Gravity, Urine 1.002 (*)    All other components within normal limits  LIPASE, BLOOD                                                                                                                          Radiology CT ABDOMEN PELVIS W CONTRAST Result Date: 09/28/2023 CLINICAL DATA:  Left lower quadrant pain. EXAM: CT  ABDOMEN AND PELVIS WITH CONTRAST TECHNIQUE: Multidetector CT imaging of the abdomen and pelvis was performed using the standard protocol following bolus administration of intravenous contrast. RADIATION DOSE REDUCTION: This exam was performed according to the departmental dose-optimization program which includes automated exposure control, adjustment of the mA and/or kV according to patient size and/or use of iterative reconstruction technique. CONTRAST:  OMNIPAQUE IOHEXOL 300 MG/ML  SOLN COMPARISON:  CT chest 09/08/2017 FINDINGS: Lower chest:  Heart is normal size. Mild calcified plaque over the right coronary artery. Lung bases are clear. Hepatobiliary: Liver, gallbladder and biliary tree are normal. Pancreas: Normal. Spleen: Normal. Adrenals/Urinary Tract: Adrenal glands are normal. Kidneys are normal in size without hydronephrosis or nephrolithiasis. No focal mass. Ureters and bladder are normal. Stomach/Bowel: Lap band apparatus over the stomach unchanged. Small bowel is unremarkable. Appendix is normal. There is diverticulosis of the colon most prominent over the descending and sigmoid colon. There is active inflammation involving a diverticula over the junction of the descending to sigmoid colon in the left lower quadrant with slightly worse second tandem site of active inflammation over the sigmoid colon in the midline to just right of midline pelvis. The sites approximate 4-5 cm apart. There is no evidence of perforation or diverticular abscess. Mild associated free fluid. Vascular/Lymphatic: Mild calcified plaque over the abdominal aorta which is normal in caliber. Remaining vascular structures are unremarkable. No significant adenopathy. Reproductive: Normal. Other: None. Musculoskeletal: Degenerative changes of the spine. No acute findings. IMPRESSION: 1. Colonic diverticulosis with 2 tandem sites of acute diverticulitis over the sigmoid colon approximately 4-5 cm apart as described. No evidence of perforation or diverticular abscess. Mild associated free fluid. 2. Aortic atherosclerosis. Atherosclerotic coronary artery disease. 3. Lap band apparatus over the stomach unchanged. Aortic Atherosclerosis (ICD10-I70.0). Electronically Signed   By: Toribio Agreste M.D.   On: 09/28/2023 12:24    Pertinent labs & imaging results that were available during my care of the patient were reviewed by me and considered in my medical decision making (see MDM for details).  Medications Ordered in ED Medications  iohexol (OMNIPAQUE) 300 MG/ML solution 100  mL (100 mLs Intravenous Contrast Given 09/28/23 1203)  amoxicillin-clavulanate (AUGMENTIN) 875-125 MG per tablet 1 tablet (1 tablet Oral Given 09/28/23 1257)                                                                                                                                     Procedures Procedures  (including critical care time)  Medical Decision Making / ED Course   This patient presents to the ED for concern of abdominal pain, this involves an extensive number of treatment options, and is a complaint that carries with it a high risk of complications and morbidity.  The differential diagnosis includes diverticulitis, epiploic appendagitis, colitis, gastroenteritis, constipation, nephrolithiasis, inflammatory bowel disease,   MDM: Patient seen emergency room for evaluation of abdominal pain.  Physical exam with  tenderness in the left lower quadrant but is otherwise unremarkable.  Laboratory evaluation with a sodium of 133, leukocytosis to 10.9 but is otherwise unremarkable.  CT abdomen pelvis showing 2 tandem sites of acute diverticulitis approximately 4 to 5 cm apart with no perforation or diverticular abscess.  I did speak with the gastroenterologist on-call Dr. Cinderella as this is a strange presentation of diverticulitis history of does not qualify for IV antibiotics in the setting of complicated diverticulitis and we are both in agreement that this patient would be fine on oral antibiotics with outpatient follow-up.  Patient received first dose of Augmentin here in the Emergency Department and was discharged with outpatient follow-up.  Return precautions given of which he voiced understanding.   Additional history obtained: -Additional history obtained from wife -External records from outside source obtained and reviewed including: Chart review including previous notes, labs, imaging, consultation notes   Lab Tests: -I ordered, reviewed, and interpreted labs.   The pertinent  results include:   Labs Reviewed  COMPREHENSIVE METABOLIC PANEL WITH GFR - Abnormal; Notable for the following components:      Result Value   Sodium 133 (*)    Chloride 96 (*)    Glucose, Bld 106 (*)    All other components within normal limits  CBC - Abnormal; Notable for the following components:   WBC 10.9 (*)    All other components within normal limits  URINALYSIS, ROUTINE W REFLEX MICROSCOPIC - Abnormal; Notable for the following components:   Color, Urine STRAW (*)    Specific Gravity, Urine 1.002 (*)    All other components within normal limits  LIPASE, BLOOD      Imaging Studies ordered: I ordered imaging studies including CT abdomen pelvis I independently visualized and interpreted imaging. I agree with the radiologist interpretation   Medicines ordered and prescription drug management: Meds ordered this encounter  Medications   iohexol (OMNIPAQUE) 300 MG/ML solution 100 mL   amoxicillin-clavulanate (AUGMENTIN) 875-125 MG per tablet 1 tablet   amoxicillin-clavulanate (AUGMENTIN) 875-125 MG tablet    Sig: Take 1 tablet by mouth every 12 (twelve) hours.    Dispense:  14 tablet    Refill:  0    -I have reviewed the patients home medicines and have made adjustments as needed  Critical interventions none  Consultations Obtained: I requested consultation with the gastroenterologist on-call Dr. Cinderella,  and discussed lab and imaging findings as well as pertinent plan - they recommend: Antibiotics and outpatient follow-up   Cardiac Monitoring: The patient was maintained on a cardiac monitor.  I personally viewed and interpreted the cardiac monitored which showed an underlying rhythm of: NSR  Social Determinants of Health:  Factors impacting patients care include: Previous history of alcohol use, sober since May 2025   Reevaluation: After the interventions noted above, I reevaluated the patient and found that they have :improved  Co morbidities that complicate  the patient evaluation  Past Medical History:  Diagnosis Date   Arthritis    Hypertension       Dispostion: I considered admission for this patient, but at this time he is not a patient to overridden and will be discharged outpatient follow-up     Final Clinical Impression(s) / ED Diagnoses Final diagnoses:  Diverticulitis     @PCDICTATION @    Albertina Dixon, MD 09/29/23 1058

## 2024-02-26 ENCOUNTER — Other Ambulatory Visit (HOSPITAL_COMMUNITY): Payer: Self-pay

## 2024-02-26 MED ORDER — ALPRAZOLAM 0.5 MG PO TABS
0.5000 mg | ORAL_TABLET | Freq: Two times a day (BID) | ORAL | 2 refills | Status: AC | PRN
  Filled 2024-02-26: qty 60, 30d supply, fill #0
  Filled 2024-04-27: qty 60, 30d supply, fill #1

## 2024-02-26 MED ORDER — ALLOPURINOL 300 MG PO TABS
300.0000 mg | ORAL_TABLET | Freq: Every day | ORAL | 3 refills | Status: AC
  Filled 2024-02-26: qty 30, 30d supply, fill #0
  Filled 2024-04-27 – 2024-04-28 (×2): qty 90, 90d supply, fill #0
  Filled 2024-05-01 – 2024-05-06 (×2): qty 30, 30d supply, fill #0
  Filled 2024-05-09: qty 90, 90d supply, fill #0

## 2024-02-26 MED ORDER — CELECOXIB 200 MG PO CAPS
200.0000 mg | ORAL_CAPSULE | Freq: Every day | ORAL | 1 refills | Status: AC | PRN
  Filled 2024-02-26: qty 30, 30d supply, fill #0

## 2024-02-26 MED ORDER — COLCHICINE 0.6 MG PO TABS
0.6000 mg | ORAL_TABLET | ORAL | 3 refills | Status: AC | PRN
  Filled 2024-02-26: qty 60, 10d supply, fill #0

## 2024-02-28 ENCOUNTER — Other Ambulatory Visit (HOSPITAL_COMMUNITY): Payer: Self-pay

## 2024-04-28 ENCOUNTER — Other Ambulatory Visit (HOSPITAL_COMMUNITY): Payer: Self-pay

## 2024-04-28 ENCOUNTER — Other Ambulatory Visit: Payer: Self-pay

## 2024-04-29 ENCOUNTER — Other Ambulatory Visit: Payer: Self-pay

## 2024-05-01 ENCOUNTER — Other Ambulatory Visit (HOSPITAL_COMMUNITY): Payer: Self-pay

## 2024-05-03 ENCOUNTER — Other Ambulatory Visit (HOSPITAL_COMMUNITY): Payer: Self-pay

## 2024-05-06 ENCOUNTER — Other Ambulatory Visit: Payer: Self-pay

## 2024-05-07 ENCOUNTER — Other Ambulatory Visit: Payer: Self-pay

## 2024-05-10 ENCOUNTER — Other Ambulatory Visit (HOSPITAL_COMMUNITY): Payer: Self-pay
